# Patient Record
Sex: Female | Born: 1940 | Race: White | Hispanic: No | Marital: Single | Smoking: Never smoker
Health system: Southern US, Community
[De-identification: ages and names within clinical notes are randomized; demographics above are authoritative.]

## PROBLEM LIST (undated history)

## (undated) DIAGNOSIS — M419 Scoliosis, unspecified: Secondary | ICD-10-CM

## (undated) DIAGNOSIS — K219 Gastro-esophageal reflux disease without esophagitis: Secondary | ICD-10-CM

## (undated) DIAGNOSIS — M1990 Unspecified osteoarthritis, unspecified site: Secondary | ICD-10-CM

## (undated) DIAGNOSIS — M810 Age-related osteoporosis without current pathological fracture: Secondary | ICD-10-CM

## (undated) DIAGNOSIS — Z9889 Other specified postprocedural states: Secondary | ICD-10-CM

## (undated) DIAGNOSIS — K589 Irritable bowel syndrome without diarrhea: Secondary | ICD-10-CM

## (undated) DIAGNOSIS — K635 Polyp of colon: Secondary | ICD-10-CM

## (undated) DIAGNOSIS — E7800 Pure hypercholesterolemia, unspecified: Secondary | ICD-10-CM

## (undated) DIAGNOSIS — Z8719 Personal history of other diseases of the digestive system: Secondary | ICD-10-CM

## (undated) DIAGNOSIS — R112 Nausea with vomiting, unspecified: Secondary | ICD-10-CM

## (undated) DIAGNOSIS — K649 Unspecified hemorrhoids: Secondary | ICD-10-CM

## (undated) HISTORY — PX: EYE SURGERY: SHX253

## (undated) HISTORY — PX: OTHER SURGICAL HISTORY: SHX169

## (undated) HISTORY — PX: NASAL ENDOSCOPY: SHX286

## (undated) HISTORY — PX: DILATION AND CURETTAGE OF UTERUS: SHX78

## (undated) HISTORY — PX: CHOLECYSTECTOMY: SHX55

## (undated) HISTORY — PX: HIP SURGERY: SHX245

## (undated) HISTORY — PX: NASAL SEPTUM SURGERY: SHX37

## (undated) HISTORY — DX: Polyp of colon: K63.5

## (undated) HISTORY — DX: Pure hypercholesterolemia, unspecified: E78.00

## (undated) HISTORY — PX: WRIST SURGERY: SHX841

---

## 2000-12-25 ENCOUNTER — Ambulatory Visit (HOSPITAL_COMMUNITY): Admission: RE | Admit: 2000-12-25 | Discharge: 2000-12-25 | Payer: Self-pay | Admitting: Gastroenterology

## 2001-02-08 ENCOUNTER — Ambulatory Visit (HOSPITAL_COMMUNITY): Admission: RE | Admit: 2001-02-08 | Discharge: 2001-02-08 | Payer: Self-pay | Admitting: Gastroenterology

## 2001-05-14 ENCOUNTER — Ambulatory Visit (HOSPITAL_COMMUNITY): Admission: RE | Admit: 2001-05-14 | Discharge: 2001-05-14 | Payer: Self-pay | Admitting: Gastroenterology

## 2001-05-14 ENCOUNTER — Encounter: Payer: Self-pay | Admitting: Gastroenterology

## 2001-09-21 ENCOUNTER — Encounter: Payer: Self-pay | Admitting: Emergency Medicine

## 2001-09-21 ENCOUNTER — Emergency Department (HOSPITAL_COMMUNITY): Admission: EM | Admit: 2001-09-21 | Discharge: 2001-09-21 | Payer: Self-pay | Admitting: Emergency Medicine

## 2002-11-17 ENCOUNTER — Ambulatory Visit (HOSPITAL_COMMUNITY): Admission: RE | Admit: 2002-11-17 | Discharge: 2002-11-17 | Payer: Self-pay | Admitting: Gastroenterology

## 2002-11-17 ENCOUNTER — Encounter: Payer: Self-pay | Admitting: Gastroenterology

## 2003-11-06 ENCOUNTER — Emergency Department (HOSPITAL_COMMUNITY): Admission: AD | Admit: 2003-11-06 | Discharge: 2003-11-06 | Payer: Self-pay | Admitting: Family Medicine

## 2003-11-27 ENCOUNTER — Other Ambulatory Visit: Admission: RE | Admit: 2003-11-27 | Discharge: 2003-11-27 | Payer: Self-pay | Admitting: Family Medicine

## 2004-12-13 ENCOUNTER — Other Ambulatory Visit: Admission: RE | Admit: 2004-12-13 | Discharge: 2004-12-13 | Payer: Self-pay | Admitting: Family Medicine

## 2005-12-26 ENCOUNTER — Other Ambulatory Visit: Admission: RE | Admit: 2005-12-26 | Discharge: 2005-12-26 | Payer: Self-pay | Admitting: Family Medicine

## 2006-11-25 ENCOUNTER — Encounter: Admission: RE | Admit: 2006-11-25 | Discharge: 2006-12-17 | Payer: Self-pay | Admitting: *Deleted

## 2007-01-06 ENCOUNTER — Other Ambulatory Visit: Admission: RE | Admit: 2007-01-06 | Discharge: 2007-01-06 | Payer: Self-pay | Admitting: Family Medicine

## 2008-02-15 ENCOUNTER — Other Ambulatory Visit: Admission: RE | Admit: 2008-02-15 | Discharge: 2008-02-15 | Payer: Self-pay | Admitting: Family Medicine

## 2009-02-15 ENCOUNTER — Encounter: Admission: RE | Admit: 2009-02-15 | Discharge: 2009-04-13 | Payer: Self-pay | Admitting: Family Medicine

## 2009-03-24 ENCOUNTER — Encounter: Admission: RE | Admit: 2009-03-24 | Discharge: 2009-03-24 | Payer: Self-pay | Admitting: Family Medicine

## 2012-04-26 DIAGNOSIS — H15839 Staphyloma posticum, unspecified eye: Secondary | ICD-10-CM | POA: Diagnosis not present

## 2012-04-26 DIAGNOSIS — H259 Unspecified age-related cataract: Secondary | ICD-10-CM | POA: Diagnosis not present

## 2012-04-26 DIAGNOSIS — H52209 Unspecified astigmatism, unspecified eye: Secondary | ICD-10-CM | POA: Diagnosis not present

## 2012-04-26 DIAGNOSIS — H31009 Unspecified chorioretinal scars, unspecified eye: Secondary | ICD-10-CM | POA: Diagnosis not present

## 2012-04-29 DIAGNOSIS — H33309 Unspecified retinal break, unspecified eye: Secondary | ICD-10-CM | POA: Diagnosis not present

## 2012-04-29 DIAGNOSIS — H33109 Unspecified retinoschisis, unspecified eye: Secondary | ICD-10-CM | POA: Diagnosis not present

## 2012-05-17 DIAGNOSIS — H259 Unspecified age-related cataract: Secondary | ICD-10-CM | POA: Diagnosis not present

## 2012-05-17 DIAGNOSIS — H5000 Unspecified esotropia: Secondary | ICD-10-CM | POA: Diagnosis not present

## 2012-05-17 DIAGNOSIS — H52209 Unspecified astigmatism, unspecified eye: Secondary | ICD-10-CM | POA: Diagnosis not present

## 2012-05-25 DIAGNOSIS — H269 Unspecified cataract: Secondary | ICD-10-CM | POA: Diagnosis not present

## 2012-05-25 DIAGNOSIS — H2510 Age-related nuclear cataract, unspecified eye: Secondary | ICD-10-CM | POA: Diagnosis not present

## 2012-05-25 DIAGNOSIS — H5000 Unspecified esotropia: Secondary | ICD-10-CM | POA: Diagnosis not present

## 2012-05-25 DIAGNOSIS — H25019 Cortical age-related cataract, unspecified eye: Secondary | ICD-10-CM | POA: Diagnosis not present

## 2012-05-25 DIAGNOSIS — H52209 Unspecified astigmatism, unspecified eye: Secondary | ICD-10-CM | POA: Diagnosis not present

## 2012-05-25 DIAGNOSIS — H5210 Myopia, unspecified eye: Secondary | ICD-10-CM | POA: Diagnosis not present

## 2012-06-22 DIAGNOSIS — H25019 Cortical age-related cataract, unspecified eye: Secondary | ICD-10-CM | POA: Diagnosis not present

## 2012-06-22 DIAGNOSIS — H269 Unspecified cataract: Secondary | ICD-10-CM | POA: Diagnosis not present

## 2012-06-22 DIAGNOSIS — H2510 Age-related nuclear cataract, unspecified eye: Secondary | ICD-10-CM | POA: Diagnosis not present

## 2012-06-22 DIAGNOSIS — H52209 Unspecified astigmatism, unspecified eye: Secondary | ICD-10-CM | POA: Diagnosis not present

## 2012-06-22 DIAGNOSIS — H5000 Unspecified esotropia: Secondary | ICD-10-CM | POA: Diagnosis not present

## 2012-07-06 DIAGNOSIS — Z1231 Encounter for screening mammogram for malignant neoplasm of breast: Secondary | ICD-10-CM | POA: Diagnosis not present

## 2012-07-15 ENCOUNTER — Other Ambulatory Visit (HOSPITAL_COMMUNITY)
Admission: RE | Admit: 2012-07-15 | Discharge: 2012-07-15 | Disposition: A | Discharge status: Home / Self Care | Payer: Medicare Other | Source: Ambulatory Visit | Attending: Family Medicine | Admitting: Family Medicine

## 2012-07-15 DIAGNOSIS — Z1331 Encounter for screening for depression: Secondary | ICD-10-CM | POA: Diagnosis not present

## 2012-07-15 DIAGNOSIS — K219 Gastro-esophageal reflux disease without esophagitis: Secondary | ICD-10-CM | POA: Diagnosis not present

## 2012-07-15 DIAGNOSIS — Z23 Encounter for immunization: Secondary | ICD-10-CM | POA: Diagnosis not present

## 2012-07-15 DIAGNOSIS — Z0000 Encounter for general adult medical examination without abnormal findings: Secondary | ICD-10-CM | POA: Diagnosis not present

## 2012-07-15 DIAGNOSIS — Z124 Encounter for screening for malignant neoplasm of cervix: Secondary | ICD-10-CM | POA: Diagnosis not present

## 2012-07-15 DIAGNOSIS — N950 Postmenopausal bleeding: Secondary | ICD-10-CM | POA: Diagnosis not present

## 2012-07-15 DIAGNOSIS — E7800 Pure hypercholesterolemia, unspecified: Secondary | ICD-10-CM | POA: Diagnosis not present

## 2012-07-15 DIAGNOSIS — M810 Age-related osteoporosis without current pathological fracture: Secondary | ICD-10-CM | POA: Diagnosis not present

## 2012-08-04 DIAGNOSIS — H43819 Vitreous degeneration, unspecified eye: Secondary | ICD-10-CM | POA: Diagnosis not present

## 2012-08-09 DIAGNOSIS — N760 Acute vaginitis: Secondary | ICD-10-CM | POA: Diagnosis not present

## 2012-08-09 DIAGNOSIS — N952 Postmenopausal atrophic vaginitis: Secondary | ICD-10-CM | POA: Diagnosis not present

## 2012-08-09 DIAGNOSIS — N950 Postmenopausal bleeding: Secondary | ICD-10-CM | POA: Diagnosis not present

## 2012-08-24 DIAGNOSIS — N950 Postmenopausal bleeding: Secondary | ICD-10-CM | POA: Diagnosis not present

## 2012-08-24 DIAGNOSIS — N952 Postmenopausal atrophic vaginitis: Secondary | ICD-10-CM | POA: Diagnosis not present

## 2013-01-05 DIAGNOSIS — K219 Gastro-esophageal reflux disease without esophagitis: Secondary | ICD-10-CM | POA: Diagnosis not present

## 2013-01-05 DIAGNOSIS — E7800 Pure hypercholesterolemia, unspecified: Secondary | ICD-10-CM | POA: Diagnosis not present

## 2013-01-05 DIAGNOSIS — J309 Allergic rhinitis, unspecified: Secondary | ICD-10-CM | POA: Diagnosis not present

## 2013-04-22 DIAGNOSIS — M810 Age-related osteoporosis without current pathological fracture: Secondary | ICD-10-CM | POA: Diagnosis not present

## 2013-05-16 DIAGNOSIS — M810 Age-related osteoporosis without current pathological fracture: Secondary | ICD-10-CM | POA: Diagnosis not present

## 2013-07-18 DIAGNOSIS — Z1231 Encounter for screening mammogram for malignant neoplasm of breast: Secondary | ICD-10-CM | POA: Diagnosis not present

## 2013-08-05 DIAGNOSIS — H5000 Unspecified esotropia: Secondary | ICD-10-CM | POA: Diagnosis not present

## 2013-08-05 DIAGNOSIS — Z961 Presence of intraocular lens: Secondary | ICD-10-CM | POA: Diagnosis not present

## 2013-08-05 DIAGNOSIS — H52209 Unspecified astigmatism, unspecified eye: Secondary | ICD-10-CM | POA: Diagnosis not present

## 2013-08-09 DIAGNOSIS — E7800 Pure hypercholesterolemia, unspecified: Secondary | ICD-10-CM | POA: Diagnosis not present

## 2013-08-09 DIAGNOSIS — Z1331 Encounter for screening for depression: Secondary | ICD-10-CM | POA: Diagnosis not present

## 2013-08-09 DIAGNOSIS — Z0000 Encounter for general adult medical examination without abnormal findings: Secondary | ICD-10-CM | POA: Diagnosis not present

## 2013-08-09 DIAGNOSIS — Z23 Encounter for immunization: Secondary | ICD-10-CM | POA: Diagnosis not present

## 2013-08-09 DIAGNOSIS — M810 Age-related osteoporosis without current pathological fracture: Secondary | ICD-10-CM | POA: Diagnosis not present

## 2013-08-24 DIAGNOSIS — N952 Postmenopausal atrophic vaginitis: Secondary | ICD-10-CM | POA: Diagnosis not present

## 2013-08-24 DIAGNOSIS — N760 Acute vaginitis: Secondary | ICD-10-CM | POA: Diagnosis not present

## 2013-10-13 DIAGNOSIS — H6120 Impacted cerumen, unspecified ear: Secondary | ICD-10-CM | POA: Diagnosis not present

## 2013-10-13 DIAGNOSIS — L299 Pruritus, unspecified: Secondary | ICD-10-CM | POA: Diagnosis not present

## 2014-01-03 DIAGNOSIS — K219 Gastro-esophageal reflux disease without esophagitis: Secondary | ICD-10-CM | POA: Diagnosis not present

## 2014-01-03 DIAGNOSIS — Z79899 Other long term (current) drug therapy: Secondary | ICD-10-CM | POA: Diagnosis not present

## 2014-01-03 DIAGNOSIS — Z23 Encounter for immunization: Secondary | ICD-10-CM | POA: Diagnosis not present

## 2014-01-03 DIAGNOSIS — E7800 Pure hypercholesterolemia, unspecified: Secondary | ICD-10-CM | POA: Diagnosis not present

## 2014-07-26 DIAGNOSIS — Z1231 Encounter for screening mammogram for malignant neoplasm of breast: Secondary | ICD-10-CM | POA: Diagnosis not present

## 2014-07-26 DIAGNOSIS — Z803 Family history of malignant neoplasm of breast: Secondary | ICD-10-CM | POA: Diagnosis not present

## 2014-08-07 DIAGNOSIS — H43813 Vitreous degeneration, bilateral: Secondary | ICD-10-CM | POA: Diagnosis not present

## 2014-08-07 DIAGNOSIS — Z961 Presence of intraocular lens: Secondary | ICD-10-CM | POA: Diagnosis not present

## 2014-08-07 DIAGNOSIS — H04123 Dry eye syndrome of bilateral lacrimal glands: Secondary | ICD-10-CM | POA: Diagnosis not present

## 2014-08-07 DIAGNOSIS — H15833 Staphyloma posticum, bilateral: Secondary | ICD-10-CM | POA: Diagnosis not present

## 2014-08-11 DIAGNOSIS — K219 Gastro-esophageal reflux disease without esophagitis: Secondary | ICD-10-CM | POA: Diagnosis not present

## 2014-08-11 DIAGNOSIS — E780 Pure hypercholesterolemia: Secondary | ICD-10-CM | POA: Diagnosis not present

## 2014-08-11 DIAGNOSIS — Z1389 Encounter for screening for other disorder: Secondary | ICD-10-CM | POA: Diagnosis not present

## 2014-08-11 DIAGNOSIS — Z0000 Encounter for general adult medical examination without abnormal findings: Secondary | ICD-10-CM | POA: Diagnosis not present

## 2014-08-11 DIAGNOSIS — Z23 Encounter for immunization: Secondary | ICD-10-CM | POA: Diagnosis not present

## 2014-08-11 DIAGNOSIS — G479 Sleep disorder, unspecified: Secondary | ICD-10-CM | POA: Diagnosis not present

## 2014-08-11 DIAGNOSIS — K589 Irritable bowel syndrome without diarrhea: Secondary | ICD-10-CM | POA: Diagnosis not present

## 2014-08-11 DIAGNOSIS — M810 Age-related osteoporosis without current pathological fracture: Secondary | ICD-10-CM | POA: Diagnosis not present

## 2014-08-28 DIAGNOSIS — N952 Postmenopausal atrophic vaginitis: Secondary | ICD-10-CM | POA: Diagnosis not present

## 2014-08-28 DIAGNOSIS — Z01411 Encounter for gynecological examination (general) (routine) with abnormal findings: Secondary | ICD-10-CM | POA: Diagnosis not present

## 2014-12-21 DIAGNOSIS — L219 Seborrheic dermatitis, unspecified: Secondary | ICD-10-CM | POA: Diagnosis not present

## 2014-12-21 DIAGNOSIS — L853 Xerosis cutis: Secondary | ICD-10-CM | POA: Diagnosis not present

## 2014-12-21 DIAGNOSIS — Z808 Family history of malignant neoplasm of other organs or systems: Secondary | ICD-10-CM | POA: Diagnosis not present

## 2014-12-21 DIAGNOSIS — H01119 Allergic dermatitis of unspecified eye, unspecified eyelid: Secondary | ICD-10-CM | POA: Diagnosis not present

## 2015-02-09 DIAGNOSIS — Z79899 Other long term (current) drug therapy: Secondary | ICD-10-CM | POA: Diagnosis not present

## 2015-02-09 DIAGNOSIS — E780 Pure hypercholesterolemia: Secondary | ICD-10-CM | POA: Diagnosis not present

## 2015-02-09 DIAGNOSIS — K219 Gastro-esophageal reflux disease without esophagitis: Secondary | ICD-10-CM | POA: Diagnosis not present

## 2015-02-09 DIAGNOSIS — L853 Xerosis cutis: Secondary | ICD-10-CM | POA: Diagnosis not present

## 2015-02-09 DIAGNOSIS — F419 Anxiety disorder, unspecified: Secondary | ICD-10-CM | POA: Diagnosis not present

## 2015-02-19 DIAGNOSIS — H01119 Allergic dermatitis of unspecified eye, unspecified eyelid: Secondary | ICD-10-CM | POA: Diagnosis not present

## 2015-02-23 DIAGNOSIS — L309 Dermatitis, unspecified: Secondary | ICD-10-CM | POA: Diagnosis not present

## 2015-04-24 DIAGNOSIS — M810 Age-related osteoporosis without current pathological fracture: Secondary | ICD-10-CM | POA: Diagnosis not present

## 2015-04-24 DIAGNOSIS — Z8262 Family history of osteoporosis: Secondary | ICD-10-CM | POA: Diagnosis not present

## 2015-08-23 DIAGNOSIS — H31003 Unspecified chorioretinal scars, bilateral: Secondary | ICD-10-CM | POA: Diagnosis not present

## 2015-08-23 DIAGNOSIS — H43813 Vitreous degeneration, bilateral: Secondary | ICD-10-CM | POA: Diagnosis not present

## 2015-08-23 DIAGNOSIS — Z0100 Encounter for examination of eyes and vision without abnormal findings: Secondary | ICD-10-CM | POA: Diagnosis not present

## 2015-08-23 DIAGNOSIS — H04123 Dry eye syndrome of bilateral lacrimal glands: Secondary | ICD-10-CM | POA: Diagnosis not present

## 2015-08-27 DIAGNOSIS — Z1389 Encounter for screening for other disorder: Secondary | ICD-10-CM | POA: Diagnosis not present

## 2015-08-27 DIAGNOSIS — Z0000 Encounter for general adult medical examination without abnormal findings: Secondary | ICD-10-CM | POA: Diagnosis not present

## 2015-08-27 DIAGNOSIS — E7800 Pure hypercholesterolemia, unspecified: Secondary | ICD-10-CM | POA: Diagnosis not present

## 2015-08-27 DIAGNOSIS — K219 Gastro-esophageal reflux disease without esophagitis: Secondary | ICD-10-CM | POA: Diagnosis not present

## 2015-08-27 DIAGNOSIS — F419 Anxiety disorder, unspecified: Secondary | ICD-10-CM | POA: Diagnosis not present

## 2015-08-27 DIAGNOSIS — M810 Age-related osteoporosis without current pathological fracture: Secondary | ICD-10-CM | POA: Diagnosis not present

## 2015-08-27 DIAGNOSIS — Z23 Encounter for immunization: Secondary | ICD-10-CM | POA: Diagnosis not present

## 2015-08-28 DIAGNOSIS — Z1231 Encounter for screening mammogram for malignant neoplasm of breast: Secondary | ICD-10-CM | POA: Diagnosis not present

## 2015-08-28 DIAGNOSIS — Z803 Family history of malignant neoplasm of breast: Secondary | ICD-10-CM | POA: Diagnosis not present

## 2015-09-03 DIAGNOSIS — K5904 Chronic idiopathic constipation: Secondary | ICD-10-CM | POA: Diagnosis not present

## 2015-09-03 DIAGNOSIS — K649 Unspecified hemorrhoids: Secondary | ICD-10-CM | POA: Diagnosis not present

## 2015-09-03 DIAGNOSIS — N952 Postmenopausal atrophic vaginitis: Secondary | ICD-10-CM | POA: Diagnosis not present

## 2015-10-30 DIAGNOSIS — M5441 Lumbago with sciatica, right side: Secondary | ICD-10-CM | POA: Diagnosis not present

## 2015-10-30 DIAGNOSIS — M4156 Other secondary scoliosis, lumbar region: Secondary | ICD-10-CM | POA: Diagnosis not present

## 2015-10-30 DIAGNOSIS — M5442 Lumbago with sciatica, left side: Secondary | ICD-10-CM | POA: Diagnosis not present

## 2015-10-30 DIAGNOSIS — M4806 Spinal stenosis, lumbar region: Secondary | ICD-10-CM | POA: Diagnosis not present

## 2015-11-21 DIAGNOSIS — G8929 Other chronic pain: Secondary | ICD-10-CM | POA: Diagnosis not present

## 2015-11-21 DIAGNOSIS — M5442 Lumbago with sciatica, left side: Secondary | ICD-10-CM | POA: Diagnosis not present

## 2015-11-21 DIAGNOSIS — M5441 Lumbago with sciatica, right side: Secondary | ICD-10-CM | POA: Diagnosis not present

## 2015-11-26 DIAGNOSIS — M5441 Lumbago with sciatica, right side: Secondary | ICD-10-CM | POA: Diagnosis not present

## 2015-11-26 DIAGNOSIS — G8929 Other chronic pain: Secondary | ICD-10-CM | POA: Diagnosis not present

## 2015-11-26 DIAGNOSIS — M5442 Lumbago with sciatica, left side: Secondary | ICD-10-CM | POA: Diagnosis not present

## 2015-11-29 DIAGNOSIS — M5441 Lumbago with sciatica, right side: Secondary | ICD-10-CM | POA: Diagnosis not present

## 2015-11-29 DIAGNOSIS — G8929 Other chronic pain: Secondary | ICD-10-CM | POA: Diagnosis not present

## 2015-11-29 DIAGNOSIS — M5442 Lumbago with sciatica, left side: Secondary | ICD-10-CM | POA: Diagnosis not present

## 2015-12-03 DIAGNOSIS — M5441 Lumbago with sciatica, right side: Secondary | ICD-10-CM | POA: Diagnosis not present

## 2015-12-03 DIAGNOSIS — M5442 Lumbago with sciatica, left side: Secondary | ICD-10-CM | POA: Diagnosis not present

## 2015-12-03 DIAGNOSIS — G8929 Other chronic pain: Secondary | ICD-10-CM | POA: Diagnosis not present

## 2015-12-06 DIAGNOSIS — G8929 Other chronic pain: Secondary | ICD-10-CM | POA: Diagnosis not present

## 2015-12-06 DIAGNOSIS — M5442 Lumbago with sciatica, left side: Secondary | ICD-10-CM | POA: Diagnosis not present

## 2015-12-06 DIAGNOSIS — M5441 Lumbago with sciatica, right side: Secondary | ICD-10-CM | POA: Diagnosis not present

## 2015-12-10 DIAGNOSIS — G8929 Other chronic pain: Secondary | ICD-10-CM | POA: Diagnosis not present

## 2015-12-10 DIAGNOSIS — M5442 Lumbago with sciatica, left side: Secondary | ICD-10-CM | POA: Diagnosis not present

## 2015-12-10 DIAGNOSIS — M5441 Lumbago with sciatica, right side: Secondary | ICD-10-CM | POA: Diagnosis not present

## 2015-12-13 DIAGNOSIS — G8929 Other chronic pain: Secondary | ICD-10-CM | POA: Diagnosis not present

## 2015-12-13 DIAGNOSIS — M5441 Lumbago with sciatica, right side: Secondary | ICD-10-CM | POA: Diagnosis not present

## 2015-12-13 DIAGNOSIS — M5442 Lumbago with sciatica, left side: Secondary | ICD-10-CM | POA: Diagnosis not present

## 2015-12-17 DIAGNOSIS — G8929 Other chronic pain: Secondary | ICD-10-CM | POA: Diagnosis not present

## 2015-12-17 DIAGNOSIS — M5441 Lumbago with sciatica, right side: Secondary | ICD-10-CM | POA: Diagnosis not present

## 2015-12-17 DIAGNOSIS — M5442 Lumbago with sciatica, left side: Secondary | ICD-10-CM | POA: Diagnosis not present

## 2015-12-20 DIAGNOSIS — M5442 Lumbago with sciatica, left side: Secondary | ICD-10-CM | POA: Diagnosis not present

## 2015-12-20 DIAGNOSIS — G8929 Other chronic pain: Secondary | ICD-10-CM | POA: Diagnosis not present

## 2015-12-20 DIAGNOSIS — M5441 Lumbago with sciatica, right side: Secondary | ICD-10-CM | POA: Diagnosis not present

## 2015-12-25 DIAGNOSIS — M5441 Lumbago with sciatica, right side: Secondary | ICD-10-CM | POA: Diagnosis not present

## 2015-12-25 DIAGNOSIS — M4156 Other secondary scoliosis, lumbar region: Secondary | ICD-10-CM | POA: Diagnosis not present

## 2015-12-25 DIAGNOSIS — M4806 Spinal stenosis, lumbar region: Secondary | ICD-10-CM | POA: Diagnosis not present

## 2015-12-25 DIAGNOSIS — M5442 Lumbago with sciatica, left side: Secondary | ICD-10-CM | POA: Diagnosis not present

## 2016-01-23 DIAGNOSIS — M5442 Lumbago with sciatica, left side: Secondary | ICD-10-CM | POA: Diagnosis not present

## 2016-02-05 DIAGNOSIS — M4806 Spinal stenosis, lumbar region: Secondary | ICD-10-CM | POA: Diagnosis not present

## 2016-02-05 DIAGNOSIS — M4156 Other secondary scoliosis, lumbar region: Secondary | ICD-10-CM | POA: Diagnosis not present

## 2016-02-05 DIAGNOSIS — M5442 Lumbago with sciatica, left side: Secondary | ICD-10-CM | POA: Diagnosis not present

## 2016-02-18 DIAGNOSIS — M4806 Spinal stenosis, lumbar region: Secondary | ICD-10-CM | POA: Diagnosis not present

## 2016-02-25 DIAGNOSIS — M4806 Spinal stenosis, lumbar region: Secondary | ICD-10-CM | POA: Diagnosis not present

## 2016-02-25 DIAGNOSIS — M5441 Lumbago with sciatica, right side: Secondary | ICD-10-CM | POA: Diagnosis not present

## 2016-02-25 DIAGNOSIS — M4156 Other secondary scoliosis, lumbar region: Secondary | ICD-10-CM | POA: Diagnosis not present

## 2016-03-20 DIAGNOSIS — M4806 Spinal stenosis, lumbar region: Secondary | ICD-10-CM | POA: Diagnosis not present

## 2016-03-20 DIAGNOSIS — M5441 Lumbago with sciatica, right side: Secondary | ICD-10-CM | POA: Diagnosis not present

## 2016-03-20 DIAGNOSIS — G8929 Other chronic pain: Secondary | ICD-10-CM | POA: Diagnosis not present

## 2016-04-09 DIAGNOSIS — M5416 Radiculopathy, lumbar region: Secondary | ICD-10-CM | POA: Diagnosis not present

## 2016-04-14 DIAGNOSIS — R109 Unspecified abdominal pain: Secondary | ICD-10-CM | POA: Diagnosis not present

## 2016-04-16 ENCOUNTER — Emergency Department (HOSPITAL_COMMUNITY): Payer: Medicare Other

## 2016-04-16 ENCOUNTER — Encounter (HOSPITAL_COMMUNITY): Payer: Self-pay

## 2016-04-16 ENCOUNTER — Emergency Department (HOSPITAL_COMMUNITY)
Admission: EM | Admit: 2016-04-16 | Discharge: 2016-04-16 | Disposition: A | Discharge status: Home / Self Care | Payer: Medicare Other | Attending: Emergency Medicine | Admitting: Emergency Medicine

## 2016-04-16 DIAGNOSIS — M549 Dorsalgia, unspecified: Secondary | ICD-10-CM | POA: Insufficient documentation

## 2016-04-16 DIAGNOSIS — R1031 Right lower quadrant pain: Secondary | ICD-10-CM | POA: Diagnosis not present

## 2016-04-16 DIAGNOSIS — K5901 Slow transit constipation: Secondary | ICD-10-CM | POA: Diagnosis not present

## 2016-04-16 DIAGNOSIS — Z79899 Other long term (current) drug therapy: Secondary | ICD-10-CM | POA: Insufficient documentation

## 2016-04-16 HISTORY — DX: Irritable bowel syndrome, unspecified: K58.9

## 2016-04-16 HISTORY — DX: Gastro-esophageal reflux disease without esophagitis: K21.9

## 2016-04-16 HISTORY — DX: Age-related osteoporosis without current pathological fracture: M81.0

## 2016-04-16 LAB — COMPREHENSIVE METABOLIC PANEL
ALBUMIN: 4.6 g/dL (ref 3.5–5.0)
ALK PHOS: 59 U/L (ref 38–126)
ALT: 12 U/L — ABNORMAL LOW (ref 14–54)
ANION GAP: 9 (ref 5–15)
AST: 20 U/L (ref 15–41)
BILIRUBIN TOTAL: 0.7 mg/dL (ref 0.3–1.2)
BUN: 20 mg/dL (ref 6–20)
CALCIUM: 9.1 mg/dL (ref 8.9–10.3)
CO2: 24 mmol/L (ref 22–32)
Chloride: 105 mmol/L (ref 101–111)
Creatinine, Ser: 0.85 mg/dL (ref 0.44–1.00)
GLUCOSE: 92 mg/dL (ref 65–99)
Potassium: 3.9 mmol/L (ref 3.5–5.1)
Sodium: 138 mmol/L (ref 135–145)
TOTAL PROTEIN: 8.2 g/dL — AB (ref 6.5–8.1)

## 2016-04-16 LAB — CBC
HCT: 42.5 % (ref 36.0–46.0)
HEMOGLOBIN: 14.5 g/dL (ref 12.0–15.0)
MCH: 29.7 pg (ref 26.0–34.0)
MCHC: 34.1 g/dL (ref 30.0–36.0)
MCV: 86.9 fL (ref 78.0–100.0)
Platelets: 276 10*3/uL (ref 150–400)
RBC: 4.89 MIL/uL (ref 3.87–5.11)
RDW: 13.6 % (ref 11.5–15.5)
WBC: 10.3 10*3/uL (ref 4.0–10.5)

## 2016-04-16 LAB — URINALYSIS, ROUTINE W REFLEX MICROSCOPIC
BILIRUBIN URINE: NEGATIVE
Glucose, UA: NEGATIVE mg/dL
Hgb urine dipstick: NEGATIVE
KETONES UR: NEGATIVE mg/dL
LEUKOCYTES UA: NEGATIVE
NITRITE: NEGATIVE
Protein, ur: NEGATIVE mg/dL
SPECIFIC GRAVITY, URINE: 1.024 (ref 1.005–1.030)
pH: 6 (ref 5.0–8.0)

## 2016-04-16 LAB — LIPASE, BLOOD: Lipase: 29 U/L (ref 11–51)

## 2016-04-16 MED ORDER — KETOROLAC TROMETHAMINE 60 MG/2ML IM SOLN
60.0000 mg | Freq: Once | INTRAMUSCULAR | Status: AC
  Administered 2016-04-16: 60 mg via INTRAMUSCULAR
  Filled 2016-04-16: qty 2

## 2016-04-16 MED ORDER — POLYETHYLENE GLYCOL 3350 17 GM/SCOOP PO POWD: ORAL | Status: DC

## 2016-04-16 MED ORDER — ACETAMINOPHEN 500 MG PO TABS
1000.0000 mg | ORAL_TABLET | Freq: Once | ORAL | Status: AC
  Administered 2016-04-16: 1000 mg via ORAL
  Filled 2016-04-16: qty 2

## 2016-04-16 NOTE — Discharge Instructions (Signed)

## 2016-04-16 NOTE — ED Notes (Signed)
Patient reports right lower abdominal pain that radiates into the right lower back x 3 days. Patient saw her PCP 2 days ago. Patient was given an rx for hycosamine. Patient states pain continues. Patient reports a history of IBS.

## 2016-04-16 NOTE — ED Provider Notes (Signed)
CSN: [id number]     Arrival date & time 04/16/16  [id number] History   First MD Initiated Contact with Patient 04/16/16 0930     Chief Complaint  Patient presents with  . Abdominal Pain  . Back Pain     (Consider location/radiation/quality/duration/timing/severity/associated sxs/prior Treatment) Patient is a 75 y.o. female presenting with abdominal pain and back pain. The history is provided by the patient.  Abdominal Pain Pain location:  RLQ Pain quality: aching and sharp   Pain radiates to:  Back and R flank Pain severity:  Moderate Onset quality:  Gradual Duration:  3 days Timing:  Constant Progression:  Worsening Chronicity:  New Context: previous surgery (remote chole)   Relieved by:  Nothing Worsened by:  Nothing tried Ineffective treatments:  None tried Associated symptoms: vomiting (on day of onset once, resolved)   Associated symptoms: no anorexia, no constipation, no diarrhea, no fever and no hematuria   Risk factors: being elderly and obesity   Back Pain Associated symptoms: abdominal pain   Associated symptoms: no fever     Past Medical History  Diagnosis Date  . IBS (irritable bowel syndrome)   . Acid reflux   . Osteoporosis    Past Surgical History  Procedure Laterality Date  . Eye surgery    . Nasal septum surgery    . Cholecystectomy    . Wrist surgery    . Hip surgery    . Thumb surgery    . Dilation and curettage of uterus     Family History  Problem Relation Age of Onset  . Rheum arthritis Mother   . Cancer Mother   . Heart failure Father    Social History  Substance Use Topics  . Smoking status: Never Smoker   . Smokeless tobacco: Never Used  . Alcohol Use: No   OB History    No data available     Review of Systems  Constitutional: Negative for fever.  Gastrointestinal: Positive for vomiting (on day of onset once, resolved) and abdominal pain. Negative for diarrhea, constipation and anorexia.  Genitourinary: Negative for hematuria.   Musculoskeletal: Positive for back pain.  All other systems reviewed and are negative.     Allergies  Ampicillin; Codeine; Crestor; Lipitor; Nitroglycerin; Sulfa antibiotics; and Adhesive  Home Medications   Prior to Admission medications   Medication Sig Start Date End Date Taking? Authorizing Provider  alendronate (FOSAMAX) 70 MG tablet Take 70 mg by mouth once a week. Take with a full glass of water on an empty stomach.   Yes Historical Provider, MD  calcium carbonate (TUMS - DOSED IN MG ELEMENTAL CALCIUM) 500 MG chewable tablet Chew 1 tablet by mouth daily.   Yes Historical Provider, MD  Calcium Citrate-Vitamin D (CALCIUM + D PO) Take 1 tablet by mouth daily.   Yes Historical Provider, MD  diphenhydramine-acetaminophen (TYLENOL PM) 25-500 MG TABS tablet Take 1 tablet by mouth at bedtime as needed (pain/sleep).   Yes Historical Provider, MD  estradiol (ESTRACE) 0.1 MG/GM vaginal cream Place 1 Applicatorful vaginally once a week.   Yes Historical Provider, MD  hyoscyamine (LEVSIN SL) 0.125 MG SL tablet Place 0.125 mg under the tongue every 4 (four) hours as needed for cramping.   Yes Historical Provider, MD  ibuprofen (ADVIL,MOTRIN) 200 MG tablet Take 400 mg by mouth every 6 (six) hours as needed for mild pain.   Yes Historical Provider, MD  Melatonin 3 MG TABS Take 1 tablet by mouth at bedtime as needed (for  sleep).   Yes Historical Provider, MD  omeprazole (PRILOSEC) 40 MG capsule Take 40 mg by mouth daily.   Yes Historical Provider, MD   BP 114/96 mmHg  Pulse 108  Temp(Src) 98.2 F (36.8 C) (Oral)  Resp 16  Ht 4' 10.5" (1.486 m)  Wt 125 lb (56.7 kg)  BMI 25.68 kg/m2  SpO2 100% Physical Exam  Constitutional: She is oriented to person, place, and time. She appears well-developed and well-nourished. No distress.  HENT:  Head: Normocephalic.  Eyes: Conjunctivae are normal.  Neck: Neck supple. No tracheal deviation present.  Cardiovascular: Normal rate and regular rhythm.    Pulmonary/Chest: Effort normal. No respiratory distress.  Abdominal: Soft. She exhibits no distension. There is tenderness (mild right sided). There is no rebound and no guarding.  Neurological: She is alert and oriented to person, place, and time.  Skin: Skin is warm and dry.  Psychiatric: She has a normal mood and affect.    ED Course  Procedures (including critical care time) Labs Review Labs Reviewed  COMPREHENSIVE METABOLIC PANEL - Abnormal; Notable for the following:    Total Protein 8.2 (*)    ALT 12 (*)    All other components within normal limits  URINALYSIS, ROUTINE W REFLEX MICROSCOPIC (NOT AT [facility]) - Abnormal; Notable for the following:    APPearance CLOUDY (*)    All other components within normal limits  LIPASE, BLOOD  CBC    Imaging Review Ct Renal Stone Study  04/16/2016  CLINICAL DATA:  Right lower quadrant abdominal pain radiating to the back, 3 days duration. EXAM: CT ABDOMEN AND PELVIS WITHOUT CONTRAST TECHNIQUE: Multidetector CT imaging of the abdomen and pelvis was performed following the standard protocol without IV contrast. COMPARISON:  None. FINDINGS: Lung bases are clear.  There is a large hiatal hernia. The liver appears normal without contrast. Previous cholecystectomy. The spleen is normal. The pancreas is normal. The adrenal glands are normal. The kidneys are normal without evidence of cyst, mass, stone or hydronephrosis. Probable dromedary hump on the left. The aorta shows atherosclerosis but no aneurysm. The IVC is normal. No retroperitoneal mass or lymphadenopathy. The appendix is normal. There is moderate amount of fecal matter in the right colon but not out of the range of normal. There is diverticulosis in the sigmoid region but no imaging finding of diverticulitis. There is pronounced curvature and degenerative change of the spine, optic RO a in the thoracolumbar junction region. Old superior endplate deformity of L2. IMPRESSION: No acute finding. No  definite cause of the presenting symptoms is identified. No evidence of urinary tract stone disease or other acute retroperitoneal pathology. Atherosclerosis of the aorta. Moderate amount of fecal matter in the right colon, not out of limits of normal. Diverticulosis without evidence of diverticulitis by imaging. Electronically Signed   By: Nelson Chimes M.D.   On: 04/16/2016 11:10   I have personally reviewed and evaluated these images and lab results as part of my medical decision-making.   EKG Interpretation None      MDM   Final diagnoses:  Slow transit constipation   75 y.o. female presents with right lower quadrant and flank pain with some radiation to back. Labs and UA reassuring but pain concerning for stone vs occult etiology given advanced age. Provided tylenol and toradol for symptoms with good relief. CT shows no stones or signs of appendicitis but moderate right sided colonic stool burden which may be producing colic symptoms. Plan for miralax bowel cleanout and  close f/u by PCP for recheck. Plan to follow up with PCP as needed and return precautions discussed for worsening or new concerning symptoms.     Leo Grosser, MD 04/16/16 1949

## 2016-04-18 ENCOUNTER — Encounter (HOSPITAL_BASED_OUTPATIENT_CLINIC_OR_DEPARTMENT_OTHER): Payer: Self-pay | Admitting: *Deleted

## 2016-04-18 ENCOUNTER — Emergency Department (HOSPITAL_BASED_OUTPATIENT_CLINIC_OR_DEPARTMENT_OTHER)
Admission: EM | Admit: 2016-04-18 | Discharge: 2016-04-18 | Disposition: A | Discharge status: Home / Self Care | Payer: Medicare Other | Attending: Emergency Medicine | Admitting: Emergency Medicine

## 2016-04-18 DIAGNOSIS — M5440 Lumbago with sciatica, unspecified side: Secondary | ICD-10-CM | POA: Diagnosis not present

## 2016-04-18 DIAGNOSIS — M5441 Lumbago with sciatica, right side: Secondary | ICD-10-CM

## 2016-04-18 DIAGNOSIS — R1031 Right lower quadrant pain: Secondary | ICD-10-CM

## 2016-04-18 LAB — CBC WITH DIFFERENTIAL/PLATELET
BASOS PCT: 1 %
Basophils Absolute: 0.1 10*3/uL (ref 0.0–0.1)
EOS ABS: 0.2 10*3/uL (ref 0.0–0.7)
EOS PCT: 3 %
HCT: 42.1 % (ref 36.0–46.0)
HEMOGLOBIN: 14.2 g/dL (ref 12.0–15.0)
Lymphocytes Relative: 21 %
Lymphs Abs: 1.7 10*3/uL (ref 0.7–4.0)
MCH: 30.1 pg (ref 26.0–34.0)
MCHC: 33.7 g/dL (ref 30.0–36.0)
MCV: 89.2 fL (ref 78.0–100.0)
MONO ABS: 1 10*3/uL (ref 0.1–1.0)
MONOS PCT: 13 %
NEUTROS ABS: 5 10*3/uL (ref 1.7–7.7)
Neutrophils Relative %: 62 %
Platelets: 248 10*3/uL (ref 150–400)
RBC: 4.72 MIL/uL (ref 3.87–5.11)
RDW: 13.7 % (ref 11.5–15.5)
WBC: 8 10*3/uL (ref 4.0–10.5)

## 2016-04-18 LAB — BASIC METABOLIC PANEL
Anion gap: 8 (ref 5–15)
BUN: 13 mg/dL (ref 6–20)
CHLORIDE: 109 mmol/L (ref 101–111)
CO2: 22 mmol/L (ref 22–32)
Calcium: 9.1 mg/dL (ref 8.9–10.3)
Creatinine, Ser: 0.7 mg/dL (ref 0.44–1.00)
GFR calc Af Amer: >60 mL/min (ref >60)
GFR calc non Af Amer: >60 mL/min (ref >60)
GLUCOSE: 97 mg/dL (ref 65–99)
POTASSIUM: 3.9 mmol/L (ref 3.5–5.1)
Sodium: 139 mmol/L (ref 135–145)

## 2016-04-18 MED ORDER — ONDANSETRON 8 MG PO TBDP
8.0000 mg | ORAL_TABLET | Freq: Once | ORAL | Status: AC
  Administered 2016-04-18: 8 mg via ORAL
  Filled 2016-04-18: qty 1

## 2016-04-18 MED ORDER — ORPHENADRINE CITRATE ER 100 MG PO TB12: 100.0000 mg | ORAL_TABLET | Freq: Two times a day (BID) | ORAL | Status: DC

## 2016-04-18 MED ORDER — OXYCODONE-ACETAMINOPHEN 5-325 MG PO TABS
1.0000 | ORAL_TABLET | Freq: Once | ORAL | Status: AC
  Administered 2016-04-18: 1 via ORAL
  Filled 2016-04-18: qty 1

## 2016-04-18 MED ORDER — NAPROXEN 500 MG PO TABS: 500.0000 mg | ORAL_TABLET | Freq: Two times a day (BID) | ORAL | Status: DC

## 2016-04-18 NOTE — Discharge Instructions (Signed)
Your lab work again today was normal. We suspect your abdominal pain may be caused by a back issue. Please take naprosyn for pain as prescribed. Norflex for spasms. Follow up with a family doctor for recheck. Return if worsening.   Back Pain, Adult Back pain is very common in adults.The cause of back pain is rarely dangerous and the pain often gets better over time.The cause of your back pain may not be known. Some common causes of back pain include:  Strain of the muscles or ligaments supporting the spine.  Wear and tear (degeneration) of the spinal disks.  Arthritis.  Direct injury to the back. For many people, back pain may return. Since back pain is rarely dangerous, most people can learn to manage this condition on their own. HOME CARE INSTRUCTIONS Watch your back pain for any changes. The following actions may help to lessen any discomfort you are feeling:  Remain active. It is stressful on your back to sit or stand in one place for long periods of time. Do not sit, drive, or stand in one place for more than 30 minutes at a time. Take short walks on even surfaces as soon as you are able.Try to increase the length of time you walk each day.  Exercise regularly as directed by your health care provider. Exercise helps your back heal faster. It also helps avoid future injury by keeping your muscles strong and flexible.  Do not stay in bed.Resting more than 1-2 days can delay your recovery.  Pay attention to your body when you bend and lift. The most comfortable positions are those that put less stress on your recovering back. Always use proper lifting techniques, including:  Bending your knees.  Keeping the load close to your body.  Avoiding twisting.  Find a comfortable position to sleep. Use a firm mattress and lie on your side with your knees slightly bent. If you lie on your back, put a pillow under your knees.  Avoid feeling anxious or stressed.Stress increases muscle  tension and can worsen back pain.It is important to recognize when you are anxious or stressed and learn ways to manage it, such as with exercise.  Take medicines only as directed by your health care provider. Over-the-counter medicines to reduce pain and inflammation are often the most helpful.Your health care provider may prescribe muscle relaxant drugs.These medicines help dull your pain so you can more quickly return to your normal activities and healthy exercise.  Apply ice to the injured area:  Put ice in a plastic bag.  Place a towel between your skin and the bag.  Leave the ice on for 20 minutes, 2-3 times a day for the first 2-3 days. After that, ice and heat may be alternated to reduce pain and spasms.  Maintain a healthy weight. Excess weight puts extra stress on your back and makes it difficult to maintain good posture. SEEK MEDICAL CARE IF:  You have pain that is not relieved with rest or medicine.  You have increasing pain going down into the legs or buttocks.  You have pain that does not improve in one week.  You have night pain.  You lose weight.  You have a fever or chills. SEEK IMMEDIATE MEDICAL CARE IF:   You develop new bowel or bladder control problems.  You have unusual weakness or numbness in your arms or legs.  You develop nausea or vomiting.  You develop abdominal pain.  You feel faint.   This information is not  intended to replace advice given to you by your health care provider. Make sure you discuss any questions you have with your health care provider.   Document Released: 10/20/2005 Document Revised: 11/10/2014 Document Reviewed: 02/21/2014 Elsevier Interactive Patient Education [employer].

## 2016-04-18 NOTE — ED Notes (Signed)
Abdominal pain. She was seen by her MD on Monday. Was seen at [facility] ED on Wednesday and CT showed increased stool. She has been using Miralax. She is not getting pain relief after having bowel movements.

## 2016-04-18 NOTE — ED Provider Notes (Signed)
CSN: [id number]     Arrival date & time 04/18/16  1636 History  By signing my name below, I, Valerie Hull, attest that this documentation has been prepared under the direction and in the presence [name], PA-C.  Electronically Signed: Tedra Coupe. Sheppard Coil, ED Scribe. 04/18/2016. 6:13 PM.    Chief Complaint  Patient presents with  . Abdominal Pain   The history is provided by the patient. No language interpreter was used.   HPI Comments: Valerie Hull is a 75 y.o. female with PMHx of IBS and Acid Reflux who presents to the Emergency Department complaining of gradual onset, constant, worsening RLQ pain radiating to right lower back and right leg x 5 days. Pt was seen by PCP on 04/14/16 for abdominal pain and presented at [facility] on 04/16/16 for worsening symptoms where she was prescribed Miralax. Per Dr. Jonelle Sports note, pt's CT showed that she has increased stool which could contribute to pain. However, she notes that pain has not relieved with using Miralax but bowel movements have been liquid-like. She has an associated loss of appetite and reports pain is exacerbated upon movement and while walking. Pt took Tylenol PM with mild relief of symptoms. She does not participate in any physical therapy exercises to relieve back pain. She reports visiting [facility] in 02/2016 where she was diagnosed with disc issues. She denies any dysuria, frequency, burning, urgency, or fever. No trouble controlling bowels or bladder. No fever. No numbness or weakness in legs.   Past Medical History  Diagnosis Date  . IBS (irritable bowel syndrome)   . Acid reflux   . Osteoporosis    Past Surgical History  Procedure Laterality Date  . Eye surgery    . Nasal septum surgery    . Cholecystectomy    . Wrist surgery    . Hip surgery    . Thumb surgery    . Dilation and curettage of uterus     Family History  Problem Relation Age of Onset  . Rheum arthritis Mother   . Cancer Mother   .  Heart failure Father    Social History  Substance Use Topics  . Smoking status: Never Smoker   . Smokeless tobacco: Never Used  . Alcohol Use: No   OB History    No data available     Review of Systems  Constitutional: Positive for appetite change. Negative for fever.  Gastrointestinal: Positive for abdominal pain and diarrhea.  Genitourinary: Negative for dysuria, urgency and frequency.  Musculoskeletal: Positive for back pain.  All other systems reviewed and are negative.   Allergies  Ampicillin; Codeine; Crestor; Lipitor; Nitroglycerin; Sulfa antibiotics; and Adhesive  Home Medications   Prior to Admission medications   Medication Sig Start Date End Date Taking? Authorizing Provider  alendronate (FOSAMAX) 70 MG tablet Take 70 mg by mouth once a week. Take with a full glass of water on an empty stomach.    Historical Provider, MD  calcium carbonate (TUMS - DOSED IN MG ELEMENTAL CALCIUM) 500 MG chewable tablet Chew 1 tablet by mouth daily.    Historical Provider, MD  Calcium Citrate-Vitamin D (CALCIUM + D PO) Take 1 tablet by mouth daily.    Historical Provider, MD  diphenhydramine-acetaminophen (TYLENOL PM) 25-500 MG TABS tablet Take 1 tablet by mouth at bedtime as needed (pain/sleep).    Historical Provider, MD  estradiol (ESTRACE) 0.1 MG/GM vaginal cream Place 1 Applicatorful vaginally once a week.    Historical Provider, MD  hyoscyamine (LEVSIN SL) 0.125 MG SL tablet Place 0.125 mg under the tongue every 4 (four) hours as needed for cramping.    Historical Provider, MD  ibuprofen (ADVIL,MOTRIN) 200 MG tablet Take 400 mg by mouth every 6 (six) hours as needed for mild pain.    Historical Provider, MD  Melatonin 3 MG TABS Take 1 tablet by mouth at bedtime as needed (for sleep).    Historical Provider, MD  omeprazole (PRILOSEC) 40 MG capsule Take 40 mg by mouth daily.    Historical Provider, MD  polyethylene glycol powder (MIRALAX) powder TAKE 6 CAPFULS OF MIRALAX IN A 32 OUNCE  GATORADE AND DRINK THE WHOLE BEVERAGE FOLLOWED BY 3 CAPFULS TWICE A DAY FOR THE NEXT WEEK AND FOLLOW UP WITH YOUR PRIMARY CARE PHYSICIAN. 04/16/16   Leo Grosser, MD   BP 141/94 mmHg  Pulse 105  Temp(Src) 97.7 F (36.5 C) (Oral)  Resp 20  Ht 4\' 11"  (1.499 m)  Wt 125 lb (56.7 kg)  BMI 25.23 kg/m2  SpO2 100% Physical Exam  Constitutional: She is oriented to person, place, and time. She appears well-developed and well-nourished. No distress.  HENT:  Head: Normocephalic and atraumatic.  Eyes: Conjunctivae and EOM are normal. Pupils are equal, round, and reactive to light.  Neck: Normal range of motion.  Cardiovascular: Normal rate, regular rhythm and normal heart sounds.   Pulmonary/Chest: Effort normal and breath sounds normal. No respiratory distress. She has no wheezes. She has no rales.  Abdominal: Soft. Bowel sounds are normal. She exhibits no distension. There is tenderness. There is no rebound and no guarding.  Right lower quadrant tenderness. No guarding or rebound tenderness  Musculoskeletal: Normal range of motion.  Right SI joint tenderness. Full range of motion right hip with no pain. Pain with right straight leg raise.  Neurological: She is alert and oriented to person, place, and time.  5/5 and equal lower extremity strength. 2+ and equal patellar reflexes bilaterally. Pt able to dorsiflex bilateral toes and feet with good strength against resistance. Equal sensation bilaterally over thighs and lower legs.   Skin: Skin is warm and dry. No rash noted.  Psychiatric: She has a normal mood and affect. Judgment normal.  Nursing note and vitals reviewed.   ED Course  Procedures (including critical care time) DIAGNOSTIC STUDIES: Oxygen Saturation is 100% on RA, normal by my interpretation.    COORDINATION OF CARE: 6:25 PM-Discussed treatment plan which includes CBC panel and BMP with pt at bedside and pt agreed to plan. Will order Percocet and Zofran  Labs Review Labs  Reviewed  CBC WITH DIFFERENTIAL/PLATELET  BASIC METABOLIC PANEL    Imaging Review No results found. I have personally reviewed and evaluated these images and lab results as part of my medical decision-making.   MDM   Final diagnoses:  Right-sided low back pain with sciatica, sciatica laterality unspecified  Right lower quadrant abdominal pain   Patient is back to emergency department with continued right lower abdominal pain and right lower back pain. On exam pain is reproducible with palpation of the right SI joint and right lower quadrant. Pain is worse with movement, laying flat and not moving makes pain better. Negative noncontrast CT 2 days ago other than stool in the right colon. Patient has also had recent workup for back pain. She had an MRI with [facility] orthopedics, unfortunately we are unable to see the results in Epic. Will recheck CBC and metabolic panel. Percocet given for pain.  Discussed with Dr.Pfeiffer,  agrees, pain most likely musculoskeletal given normal white count, negative recent CT scan. We will try NSAIDs and muscle relaxants. Close follow-up with family doctor. Return precautions discussed.  Filed Vitals:   04/18/16 1642 04/18/16 1843  BP: 141/94 150/79  Pulse: 105 90  Temp: 97.7 F (36.5 C)   TempSrc: Oral   Resp: 20 20  Height: 4\' 11"  (1.499 m)   Weight: 56.7 kg   SpO2: 100% 100%    I personally performed the services described in this documentation, which was scribed in my presence. The recorded information has been reviewed and is accurate.   Jeannett Senior, PA-C 04/18/16 2008  Charlesetta Shanks, MD 04/19/16 [phone]

## 2016-08-27 DIAGNOSIS — Z0000 Encounter for general adult medical examination without abnormal findings: Secondary | ICD-10-CM | POA: Diagnosis not present

## 2016-08-27 DIAGNOSIS — Z131 Encounter for screening for diabetes mellitus: Secondary | ICD-10-CM | POA: Diagnosis not present

## 2016-08-27 DIAGNOSIS — E7800 Pure hypercholesterolemia, unspecified: Secondary | ICD-10-CM | POA: Diagnosis not present

## 2016-08-27 DIAGNOSIS — M810 Age-related osteoporosis without current pathological fracture: Secondary | ICD-10-CM | POA: Diagnosis not present

## 2016-08-29 DIAGNOSIS — H04123 Dry eye syndrome of bilateral lacrimal glands: Secondary | ICD-10-CM | POA: Diagnosis not present

## 2016-08-29 DIAGNOSIS — H31003 Unspecified chorioretinal scars, bilateral: Secondary | ICD-10-CM | POA: Diagnosis not present

## 2016-08-29 DIAGNOSIS — H52203 Unspecified astigmatism, bilateral: Secondary | ICD-10-CM | POA: Diagnosis not present

## 2016-08-29 DIAGNOSIS — H43813 Vitreous degeneration, bilateral: Secondary | ICD-10-CM | POA: Diagnosis not present

## 2016-09-01 DIAGNOSIS — M810 Age-related osteoporosis without current pathological fracture: Secondary | ICD-10-CM | POA: Diagnosis not present

## 2016-09-01 DIAGNOSIS — Z1389 Encounter for screening for other disorder: Secondary | ICD-10-CM | POA: Diagnosis not present

## 2016-09-01 DIAGNOSIS — E7800 Pure hypercholesterolemia, unspecified: Secondary | ICD-10-CM | POA: Diagnosis not present

## 2016-09-01 DIAGNOSIS — Z0000 Encounter for general adult medical examination without abnormal findings: Secondary | ICD-10-CM | POA: Diagnosis not present

## 2016-09-01 DIAGNOSIS — Z23 Encounter for immunization: Secondary | ICD-10-CM | POA: Diagnosis not present

## 2016-09-02 DIAGNOSIS — Z1231 Encounter for screening mammogram for malignant neoplasm of breast: Secondary | ICD-10-CM | POA: Diagnosis not present

## 2016-09-03 DIAGNOSIS — Z01419 Encounter for gynecological examination (general) (routine) without abnormal findings: Secondary | ICD-10-CM | POA: Diagnosis not present

## 2016-09-03 DIAGNOSIS — N952 Postmenopausal atrophic vaginitis: Secondary | ICD-10-CM | POA: Diagnosis not present

## 2016-09-08 DIAGNOSIS — M4120 Other idiopathic scoliosis, site unspecified: Secondary | ICD-10-CM | POA: Diagnosis not present

## 2016-10-21 DIAGNOSIS — R293 Abnormal posture: Secondary | ICD-10-CM | POA: Diagnosis not present

## 2016-10-21 DIAGNOSIS — M5417 Radiculopathy, lumbosacral region: Secondary | ICD-10-CM | POA: Diagnosis not present

## 2016-10-21 DIAGNOSIS — M545 Low back pain: Secondary | ICD-10-CM | POA: Diagnosis not present

## 2016-10-21 DIAGNOSIS — M5416 Radiculopathy, lumbar region: Secondary | ICD-10-CM | POA: Diagnosis not present

## 2016-10-23 DIAGNOSIS — R293 Abnormal posture: Secondary | ICD-10-CM | POA: Diagnosis not present

## 2016-10-23 DIAGNOSIS — M5416 Radiculopathy, lumbar region: Secondary | ICD-10-CM | POA: Diagnosis not present

## 2016-10-23 DIAGNOSIS — M545 Low back pain: Secondary | ICD-10-CM | POA: Diagnosis not present

## 2016-10-23 DIAGNOSIS — M5417 Radiculopathy, lumbosacral region: Secondary | ICD-10-CM | POA: Diagnosis not present

## 2016-10-28 DIAGNOSIS — M5416 Radiculopathy, lumbar region: Secondary | ICD-10-CM | POA: Diagnosis not present

## 2016-10-28 DIAGNOSIS — M5417 Radiculopathy, lumbosacral region: Secondary | ICD-10-CM | POA: Diagnosis not present

## 2016-10-28 DIAGNOSIS — R293 Abnormal posture: Secondary | ICD-10-CM | POA: Diagnosis not present

## 2016-10-28 DIAGNOSIS — M545 Low back pain: Secondary | ICD-10-CM | POA: Diagnosis not present

## 2016-10-31 DIAGNOSIS — R293 Abnormal posture: Secondary | ICD-10-CM | POA: Diagnosis not present

## 2016-10-31 DIAGNOSIS — M5417 Radiculopathy, lumbosacral region: Secondary | ICD-10-CM | POA: Diagnosis not present

## 2016-10-31 DIAGNOSIS — M5416 Radiculopathy, lumbar region: Secondary | ICD-10-CM | POA: Diagnosis not present

## 2016-10-31 DIAGNOSIS — M545 Low back pain: Secondary | ICD-10-CM | POA: Diagnosis not present

## 2016-11-04 DIAGNOSIS — M5416 Radiculopathy, lumbar region: Secondary | ICD-10-CM | POA: Diagnosis not present

## 2016-11-04 DIAGNOSIS — M545 Low back pain: Secondary | ICD-10-CM | POA: Diagnosis not present

## 2016-11-04 DIAGNOSIS — R293 Abnormal posture: Secondary | ICD-10-CM | POA: Diagnosis not present

## 2016-11-04 DIAGNOSIS — M5417 Radiculopathy, lumbosacral region: Secondary | ICD-10-CM | POA: Diagnosis not present

## 2016-11-06 DIAGNOSIS — M4120 Other idiopathic scoliosis, site unspecified: Secondary | ICD-10-CM | POA: Diagnosis not present

## 2016-11-06 DIAGNOSIS — M5416 Radiculopathy, lumbar region: Secondary | ICD-10-CM | POA: Diagnosis not present

## 2016-11-07 DIAGNOSIS — M5416 Radiculopathy, lumbar region: Secondary | ICD-10-CM | POA: Diagnosis not present

## 2016-11-07 DIAGNOSIS — M5417 Radiculopathy, lumbosacral region: Secondary | ICD-10-CM | POA: Diagnosis not present

## 2016-11-07 DIAGNOSIS — M545 Low back pain: Secondary | ICD-10-CM | POA: Diagnosis not present

## 2016-11-07 DIAGNOSIS — R293 Abnormal posture: Secondary | ICD-10-CM | POA: Diagnosis not present

## 2016-11-10 DIAGNOSIS — R293 Abnormal posture: Secondary | ICD-10-CM | POA: Diagnosis not present

## 2016-11-10 DIAGNOSIS — M5417 Radiculopathy, lumbosacral region: Secondary | ICD-10-CM | POA: Diagnosis not present

## 2016-11-10 DIAGNOSIS — M5416 Radiculopathy, lumbar region: Secondary | ICD-10-CM | POA: Diagnosis not present

## 2016-11-10 DIAGNOSIS — M545 Low back pain: Secondary | ICD-10-CM | POA: Diagnosis not present

## 2016-11-12 DIAGNOSIS — R293 Abnormal posture: Secondary | ICD-10-CM | POA: Diagnosis not present

## 2016-11-12 DIAGNOSIS — M545 Low back pain: Secondary | ICD-10-CM | POA: Diagnosis not present

## 2016-11-12 DIAGNOSIS — M5417 Radiculopathy, lumbosacral region: Secondary | ICD-10-CM | POA: Diagnosis not present

## 2016-11-12 DIAGNOSIS — M5416 Radiculopathy, lumbar region: Secondary | ICD-10-CM | POA: Diagnosis not present

## 2016-11-18 DIAGNOSIS — R293 Abnormal posture: Secondary | ICD-10-CM | POA: Diagnosis not present

## 2016-11-18 DIAGNOSIS — M5417 Radiculopathy, lumbosacral region: Secondary | ICD-10-CM | POA: Diagnosis not present

## 2016-11-18 DIAGNOSIS — M5416 Radiculopathy, lumbar region: Secondary | ICD-10-CM | POA: Diagnosis not present

## 2016-11-18 DIAGNOSIS — M545 Low back pain: Secondary | ICD-10-CM | POA: Diagnosis not present

## 2016-11-25 DIAGNOSIS — M5417 Radiculopathy, lumbosacral region: Secondary | ICD-10-CM | POA: Diagnosis not present

## 2016-11-25 DIAGNOSIS — M5416 Radiculopathy, lumbar region: Secondary | ICD-10-CM | POA: Diagnosis not present

## 2016-11-25 DIAGNOSIS — M545 Low back pain: Secondary | ICD-10-CM | POA: Diagnosis not present

## 2016-11-25 DIAGNOSIS — R293 Abnormal posture: Secondary | ICD-10-CM | POA: Diagnosis not present

## 2016-11-28 DIAGNOSIS — R293 Abnormal posture: Secondary | ICD-10-CM | POA: Diagnosis not present

## 2016-11-28 DIAGNOSIS — M5416 Radiculopathy, lumbar region: Secondary | ICD-10-CM | POA: Diagnosis not present

## 2016-11-28 DIAGNOSIS — M545 Low back pain: Secondary | ICD-10-CM | POA: Diagnosis not present

## 2016-11-28 DIAGNOSIS — M5417 Radiculopathy, lumbosacral region: Secondary | ICD-10-CM | POA: Diagnosis not present

## 2016-12-02 DIAGNOSIS — M545 Low back pain: Secondary | ICD-10-CM | POA: Diagnosis not present

## 2016-12-02 DIAGNOSIS — M5416 Radiculopathy, lumbar region: Secondary | ICD-10-CM | POA: Diagnosis not present

## 2016-12-02 DIAGNOSIS — R293 Abnormal posture: Secondary | ICD-10-CM | POA: Diagnosis not present

## 2016-12-02 DIAGNOSIS — M5417 Radiculopathy, lumbosacral region: Secondary | ICD-10-CM | POA: Diagnosis not present

## 2016-12-04 DIAGNOSIS — M4120 Other idiopathic scoliosis, site unspecified: Secondary | ICD-10-CM | POA: Diagnosis not present

## 2016-12-04 DIAGNOSIS — M5416 Radiculopathy, lumbar region: Secondary | ICD-10-CM | POA: Diagnosis not present

## 2016-12-08 DIAGNOSIS — M545 Low back pain: Secondary | ICD-10-CM | POA: Diagnosis not present

## 2016-12-08 DIAGNOSIS — M5417 Radiculopathy, lumbosacral region: Secondary | ICD-10-CM | POA: Diagnosis not present

## 2016-12-08 DIAGNOSIS — R293 Abnormal posture: Secondary | ICD-10-CM | POA: Diagnosis not present

## 2016-12-08 DIAGNOSIS — M5416 Radiculopathy, lumbar region: Secondary | ICD-10-CM | POA: Diagnosis not present

## 2016-12-10 DIAGNOSIS — M5417 Radiculopathy, lumbosacral region: Secondary | ICD-10-CM | POA: Diagnosis not present

## 2016-12-10 DIAGNOSIS — M545 Low back pain: Secondary | ICD-10-CM | POA: Diagnosis not present

## 2016-12-10 DIAGNOSIS — M5416 Radiculopathy, lumbar region: Secondary | ICD-10-CM | POA: Diagnosis not present

## 2016-12-10 DIAGNOSIS — R293 Abnormal posture: Secondary | ICD-10-CM | POA: Diagnosis not present

## 2016-12-22 DIAGNOSIS — R030 Elevated blood-pressure reading, without diagnosis of hypertension: Secondary | ICD-10-CM | POA: Diagnosis not present

## 2016-12-22 DIAGNOSIS — M4120 Other idiopathic scoliosis, site unspecified: Secondary | ICD-10-CM | POA: Diagnosis not present

## 2016-12-22 DIAGNOSIS — M5416 Radiculopathy, lumbar region: Secondary | ICD-10-CM | POA: Diagnosis not present

## 2016-12-22 DIAGNOSIS — Z6826 Body mass index (BMI) 26.0-26.9, adult: Secondary | ICD-10-CM | POA: Diagnosis not present

## 2017-01-01 DIAGNOSIS — Z6827 Body mass index (BMI) 27.0-27.9, adult: Secondary | ICD-10-CM | POA: Diagnosis not present

## 2017-01-01 DIAGNOSIS — M5416 Radiculopathy, lumbar region: Secondary | ICD-10-CM | POA: Diagnosis not present

## 2017-01-01 DIAGNOSIS — R030 Elevated blood-pressure reading, without diagnosis of hypertension: Secondary | ICD-10-CM | POA: Diagnosis not present

## 2017-01-01 DIAGNOSIS — M4120 Other idiopathic scoliosis, site unspecified: Secondary | ICD-10-CM | POA: Diagnosis not present

## 2017-01-18 IMAGING — CT CT RENAL STONE PROTOCOL
2 of 3 series · 17 of 46 positions shown, 19 images · non-contrast
Comparison: None.

CLINICAL DATA: Right lower quadrant abdominal pain radiating to the
back, 3 days duration.

EXAM:
CT ABDOMEN AND PELVIS WITHOUT CONTRAST
TECHNIQUE: Multidetector CT imaging of the abdomen and pelvis was performed
following the standard protocol without IV contrast.

[Series 4: lung · axial · 0.64mm/px · z∈[-122,-36]mm · 14 of 51 slices shown, 16 images]
[im 4/51  soft-tissue]
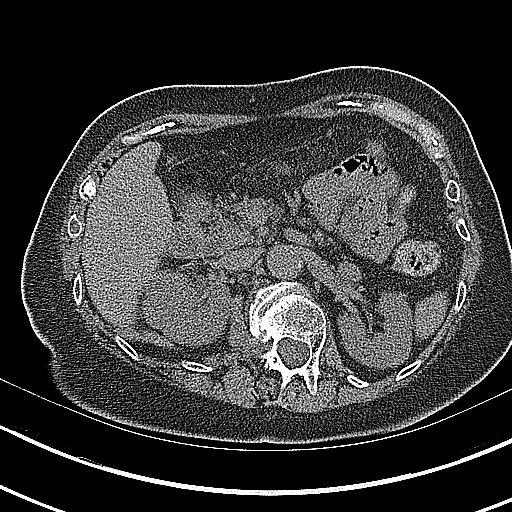
[im 4/51  bone]
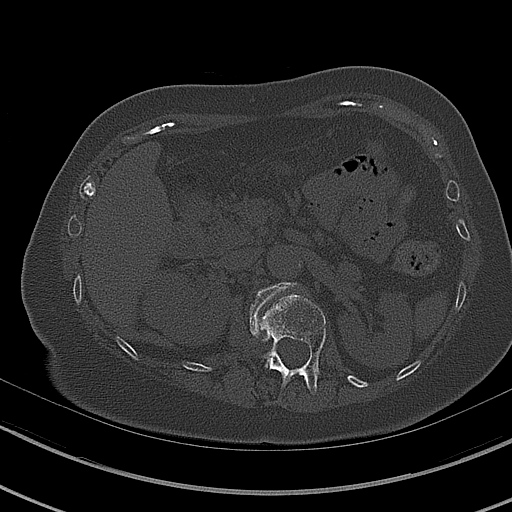
[im 7/51  soft-tissue]
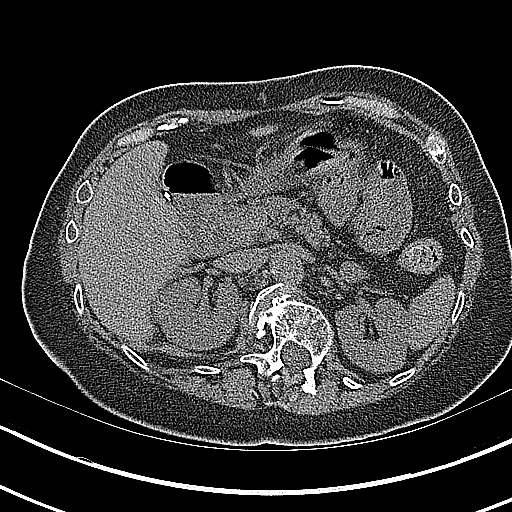
[im 10/51  soft-tissue]
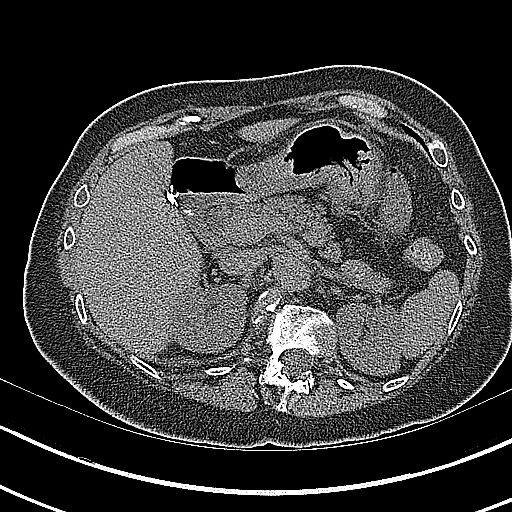
[im 13/51  soft-tissue]
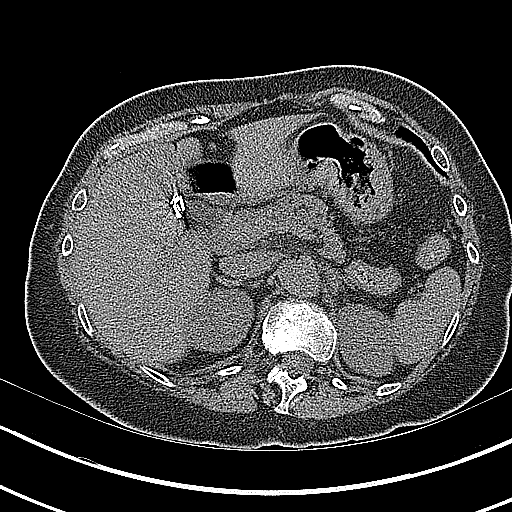
[im 17/51  soft-tissue]
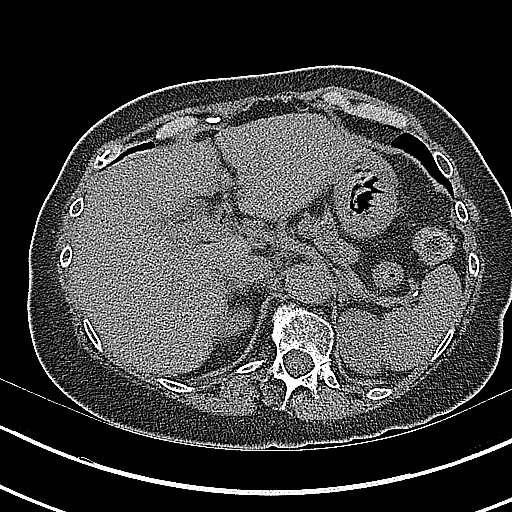
[im 20/51  soft-tissue]
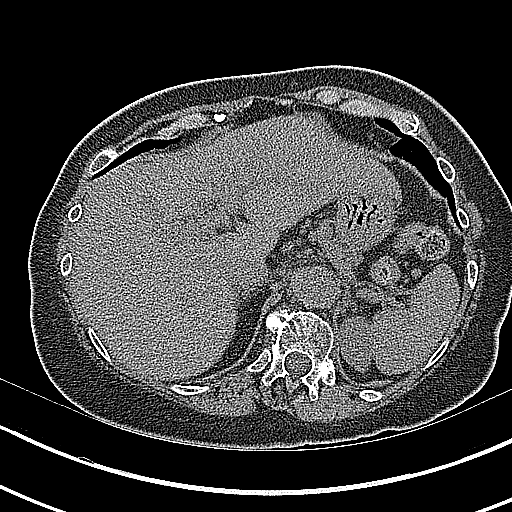
[im 23/51  soft-tissue]
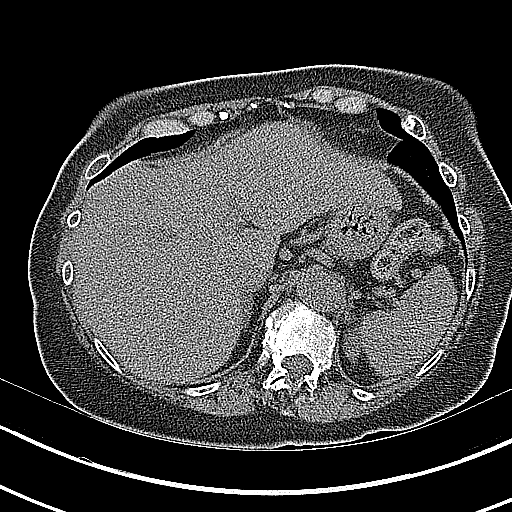
[im 28/51  soft-tissue]
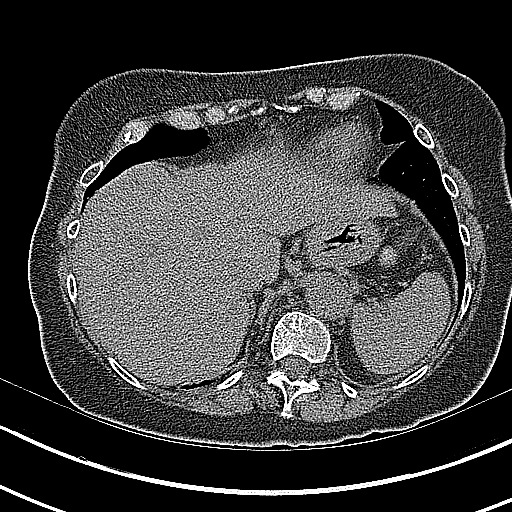
[im 31/51  soft-tissue]
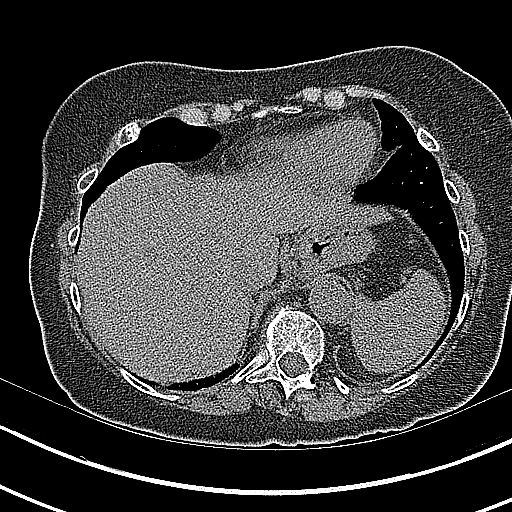
[im 31/51  bone]
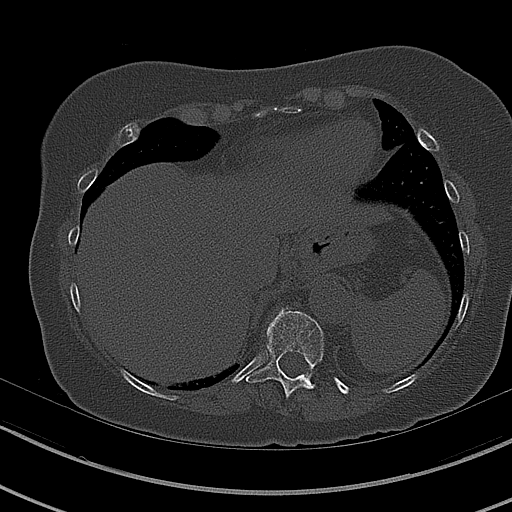
[im 34/51  soft-tissue]
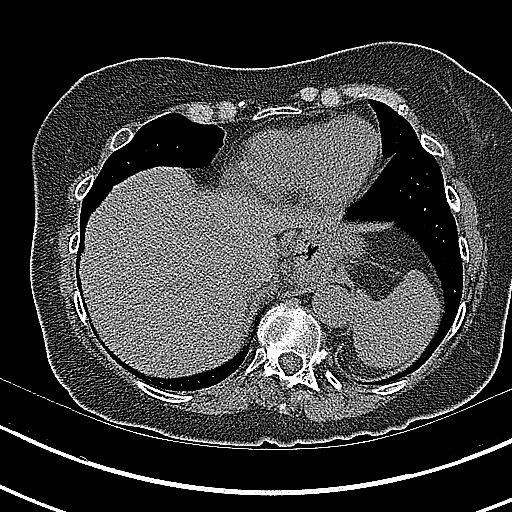
[im 38/51  soft-tissue]
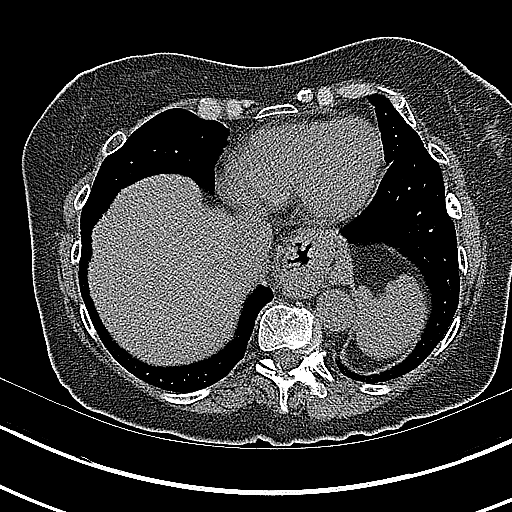
[im 41/51  soft-tissue]
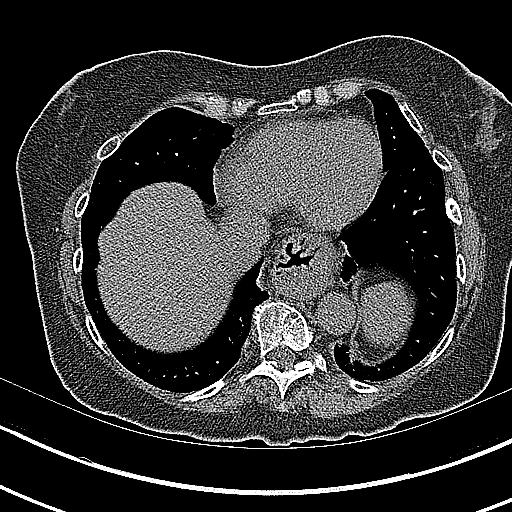
[im 44/51  soft-tissue]
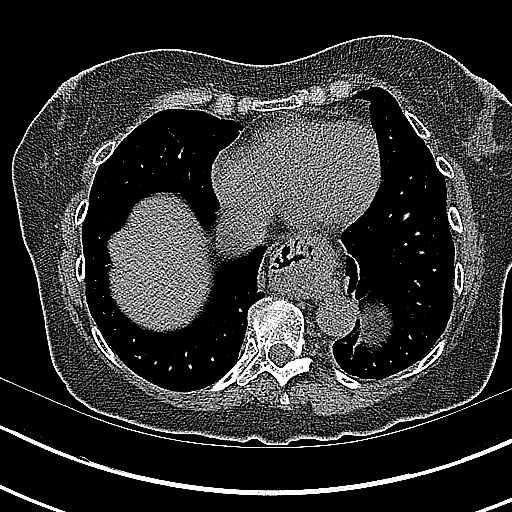
[im 47/51  soft-tissue]
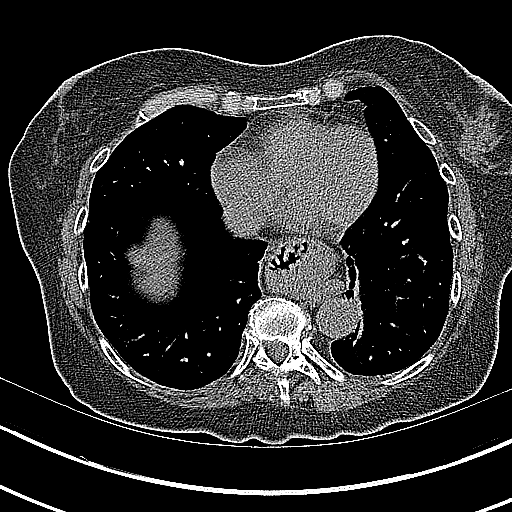

[Series 5: coronal · coronal · 0.63mm/px · 3 of 123 slices shown]
[im 41/123  soft-tissue]
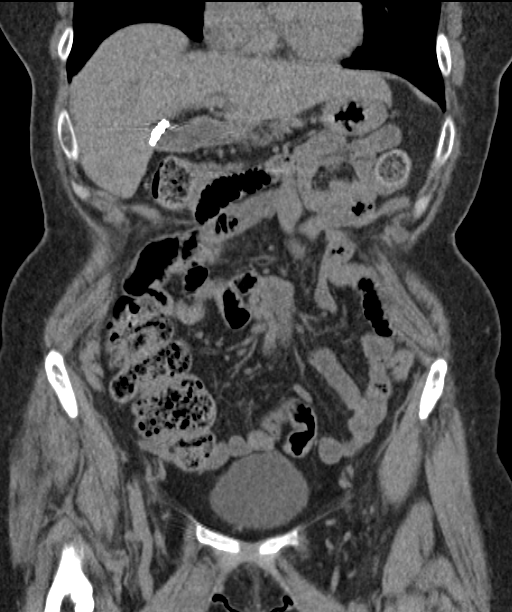
[im 55/123  soft-tissue]
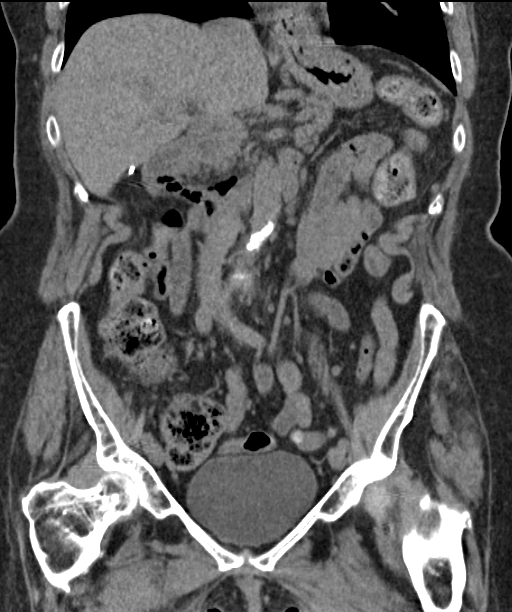
[im 68/123  soft-tissue]
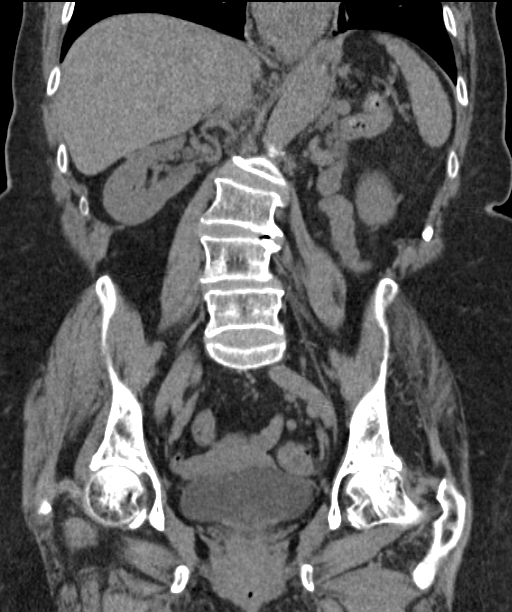

[17 of 46 positions shown; findings below may reference images not displayed]

FINDINGS: Lung bases are clear.  There is a large hiatal hernia.

The liver appears normal without contrast. Previous cholecystectomy.
The spleen is normal. The pancreas is normal. The adrenal glands are
normal. The kidneys are normal without evidence of cyst, mass, stone
or hydronephrosis. Probable dromedary hump on the left. The aorta
shows atherosclerosis but no aneurysm. The IVC is normal. No
retroperitoneal mass or lymphadenopathy. The appendix is normal.
There is moderate amount of fecal matter in the right colon but not
out of the range of normal. There is diverticulosis in the sigmoid
region but no imaging finding of diverticulitis. There is pronounced
curvature and degenerative change of the spine, optic RO a in the
thoracolumbar junction region. Old superior endplate deformity of
L2.
IMPRESSION: No acute finding. No definite cause of the presenting symptoms is
identified.

No evidence of urinary tract stone disease or other acute
retroperitoneal pathology.

Atherosclerosis of the aorta.

Moderate amount of fecal matter in the right colon, not out of
limits of normal.

Diverticulosis without evidence of diverticulitis by imaging.

## 2017-03-05 DIAGNOSIS — M4120 Other idiopathic scoliosis, site unspecified: Secondary | ICD-10-CM | POA: Diagnosis not present

## 2017-03-05 DIAGNOSIS — M5416 Radiculopathy, lumbar region: Secondary | ICD-10-CM | POA: Diagnosis not present

## 2017-04-07 DIAGNOSIS — M5416 Radiculopathy, lumbar region: Secondary | ICD-10-CM | POA: Diagnosis not present

## 2017-04-07 DIAGNOSIS — Z6826 Body mass index (BMI) 26.0-26.9, adult: Secondary | ICD-10-CM | POA: Diagnosis not present

## 2017-04-07 DIAGNOSIS — R030 Elevated blood-pressure reading, without diagnosis of hypertension: Secondary | ICD-10-CM | POA: Diagnosis not present

## 2017-04-07 DIAGNOSIS — M4120 Other idiopathic scoliosis, site unspecified: Secondary | ICD-10-CM | POA: Diagnosis not present

## 2017-04-24 DIAGNOSIS — M810 Age-related osteoporosis without current pathological fracture: Secondary | ICD-10-CM | POA: Diagnosis not present

## 2017-05-14 DIAGNOSIS — E7800 Pure hypercholesterolemia, unspecified: Secondary | ICD-10-CM | POA: Diagnosis not present

## 2017-05-14 DIAGNOSIS — M549 Dorsalgia, unspecified: Secondary | ICD-10-CM | POA: Diagnosis not present

## 2017-05-14 DIAGNOSIS — M810 Age-related osteoporosis without current pathological fracture: Secondary | ICD-10-CM | POA: Diagnosis not present

## 2017-06-02 DIAGNOSIS — H6123 Impacted cerumen, bilateral: Secondary | ICD-10-CM | POA: Diagnosis not present

## 2017-06-02 DIAGNOSIS — H608X3 Other otitis externa, bilateral: Secondary | ICD-10-CM | POA: Diagnosis not present

## 2017-06-09 DIAGNOSIS — M5416 Radiculopathy, lumbar region: Secondary | ICD-10-CM | POA: Diagnosis not present

## 2017-06-12 DIAGNOSIS — J0100 Acute maxillary sinusitis, unspecified: Secondary | ICD-10-CM | POA: Diagnosis not present

## 2017-07-01 DIAGNOSIS — R030 Elevated blood-pressure reading, without diagnosis of hypertension: Secondary | ICD-10-CM | POA: Diagnosis not present

## 2017-07-01 DIAGNOSIS — M5416 Radiculopathy, lumbar region: Secondary | ICD-10-CM | POA: Diagnosis not present

## 2017-07-01 DIAGNOSIS — Z6825 Body mass index (BMI) 25.0-25.9, adult: Secondary | ICD-10-CM | POA: Diagnosis not present

## 2017-08-20 DIAGNOSIS — M25562 Pain in left knee: Secondary | ICD-10-CM | POA: Diagnosis not present

## 2017-08-20 DIAGNOSIS — M160 Bilateral primary osteoarthritis of hip: Secondary | ICD-10-CM | POA: Diagnosis not present

## 2017-08-20 DIAGNOSIS — M2241 Chondromalacia patellae, right knee: Secondary | ICD-10-CM | POA: Diagnosis not present

## 2017-08-20 DIAGNOSIS — M25561 Pain in right knee: Secondary | ICD-10-CM | POA: Diagnosis not present

## 2017-08-24 DIAGNOSIS — J019 Acute sinusitis, unspecified: Secondary | ICD-10-CM | POA: Diagnosis not present

## 2017-09-07 DIAGNOSIS — N952 Postmenopausal atrophic vaginitis: Secondary | ICD-10-CM | POA: Diagnosis not present

## 2017-09-10 DIAGNOSIS — M5416 Radiculopathy, lumbar region: Secondary | ICD-10-CM | POA: Diagnosis not present

## 2017-09-10 DIAGNOSIS — M4120 Other idiopathic scoliosis, site unspecified: Secondary | ICD-10-CM | POA: Diagnosis not present

## 2017-09-14 DIAGNOSIS — Z0000 Encounter for general adult medical examination without abnormal findings: Secondary | ICD-10-CM | POA: Diagnosis not present

## 2017-09-14 DIAGNOSIS — R0789 Other chest pain: Secondary | ICD-10-CM | POA: Diagnosis not present

## 2017-09-14 DIAGNOSIS — K219 Gastro-esophageal reflux disease without esophagitis: Secondary | ICD-10-CM | POA: Diagnosis not present

## 2017-09-14 DIAGNOSIS — Z23 Encounter for immunization: Secondary | ICD-10-CM | POA: Diagnosis not present

## 2017-09-14 DIAGNOSIS — Z1389 Encounter for screening for other disorder: Secondary | ICD-10-CM | POA: Diagnosis not present

## 2017-09-14 DIAGNOSIS — E7800 Pure hypercholesterolemia, unspecified: Secondary | ICD-10-CM | POA: Diagnosis not present

## 2017-09-16 DIAGNOSIS — R0781 Pleurodynia: Secondary | ICD-10-CM | POA: Diagnosis not present

## 2017-09-22 DIAGNOSIS — Z1231 Encounter for screening mammogram for malignant neoplasm of breast: Secondary | ICD-10-CM | POA: Diagnosis not present

## 2017-09-28 DIAGNOSIS — H5213 Myopia, bilateral: Secondary | ICD-10-CM | POA: Diagnosis not present

## 2017-09-28 DIAGNOSIS — H04123 Dry eye syndrome of bilateral lacrimal glands: Secondary | ICD-10-CM | POA: Diagnosis not present

## 2017-09-28 DIAGNOSIS — H31003 Unspecified chorioretinal scars, bilateral: Secondary | ICD-10-CM | POA: Diagnosis not present

## 2017-09-28 DIAGNOSIS — H43813 Vitreous degeneration, bilateral: Secondary | ICD-10-CM | POA: Diagnosis not present

## 2017-10-06 DIAGNOSIS — M5416 Radiculopathy, lumbar region: Secondary | ICD-10-CM | POA: Diagnosis not present

## 2017-10-06 DIAGNOSIS — R030 Elevated blood-pressure reading, without diagnosis of hypertension: Secondary | ICD-10-CM | POA: Diagnosis not present

## 2017-10-06 DIAGNOSIS — M4120 Other idiopathic scoliosis, site unspecified: Secondary | ICD-10-CM | POA: Diagnosis not present

## 2017-11-06 DIAGNOSIS — K219 Gastro-esophageal reflux disease without esophagitis: Secondary | ICD-10-CM | POA: Diagnosis not present

## 2017-11-06 DIAGNOSIS — K5790 Diverticulosis of intestine, part unspecified, without perforation or abscess without bleeding: Secondary | ICD-10-CM | POA: Insufficient documentation

## 2017-12-14 DIAGNOSIS — M4120 Other idiopathic scoliosis, site unspecified: Secondary | ICD-10-CM | POA: Diagnosis not present

## 2017-12-14 DIAGNOSIS — M5416 Radiculopathy, lumbar region: Secondary | ICD-10-CM | POA: Diagnosis not present

## 2018-01-19 DIAGNOSIS — M5416 Radiculopathy, lumbar region: Secondary | ICD-10-CM | POA: Diagnosis not present

## 2018-01-19 DIAGNOSIS — M4120 Other idiopathic scoliosis, site unspecified: Secondary | ICD-10-CM | POA: Diagnosis not present

## 2018-03-15 DIAGNOSIS — M5416 Radiculopathy, lumbar region: Secondary | ICD-10-CM | POA: Diagnosis not present

## 2018-03-15 DIAGNOSIS — M4120 Other idiopathic scoliosis, site unspecified: Secondary | ICD-10-CM | POA: Diagnosis not present

## 2018-04-15 DIAGNOSIS — M4120 Other idiopathic scoliosis, site unspecified: Secondary | ICD-10-CM | POA: Diagnosis not present

## 2018-04-15 DIAGNOSIS — M5416 Radiculopathy, lumbar region: Secondary | ICD-10-CM | POA: Diagnosis not present

## 2018-08-19 DIAGNOSIS — M5416 Radiculopathy, lumbar region: Secondary | ICD-10-CM | POA: Diagnosis not present

## 2018-08-19 DIAGNOSIS — M4120 Other idiopathic scoliosis, site unspecified: Secondary | ICD-10-CM | POA: Diagnosis not present

## 2018-08-25 DIAGNOSIS — Z23 Encounter for immunization: Secondary | ICD-10-CM | POA: Diagnosis not present

## 2018-09-21 DIAGNOSIS — E7800 Pure hypercholesterolemia, unspecified: Secondary | ICD-10-CM | POA: Diagnosis not present

## 2018-09-21 DIAGNOSIS — M810 Age-related osteoporosis without current pathological fracture: Secondary | ICD-10-CM | POA: Diagnosis not present

## 2018-09-21 DIAGNOSIS — F419 Anxiety disorder, unspecified: Secondary | ICD-10-CM | POA: Diagnosis not present

## 2018-09-21 DIAGNOSIS — K625 Hemorrhage of anus and rectum: Secondary | ICD-10-CM | POA: Diagnosis not present

## 2018-09-21 DIAGNOSIS — K219 Gastro-esophageal reflux disease without esophagitis: Secondary | ICD-10-CM | POA: Diagnosis not present

## 2018-09-21 DIAGNOSIS — Z0000 Encounter for general adult medical examination without abnormal findings: Secondary | ICD-10-CM | POA: Diagnosis not present

## 2018-09-24 DIAGNOSIS — Z01419 Encounter for gynecological examination (general) (routine) without abnormal findings: Secondary | ICD-10-CM | POA: Diagnosis not present

## 2018-09-24 DIAGNOSIS — N952 Postmenopausal atrophic vaginitis: Secondary | ICD-10-CM | POA: Diagnosis not present

## 2018-09-27 DIAGNOSIS — R030 Elevated blood-pressure reading, without diagnosis of hypertension: Secondary | ICD-10-CM | POA: Diagnosis not present

## 2018-09-27 DIAGNOSIS — M5416 Radiculopathy, lumbar region: Secondary | ICD-10-CM | POA: Diagnosis not present

## 2018-09-27 DIAGNOSIS — M4120 Other idiopathic scoliosis, site unspecified: Secondary | ICD-10-CM | POA: Diagnosis not present

## 2018-09-28 DIAGNOSIS — Z1231 Encounter for screening mammogram for malignant neoplasm of breast: Secondary | ICD-10-CM | POA: Diagnosis not present

## 2018-10-04 DIAGNOSIS — Z961 Presence of intraocular lens: Secondary | ICD-10-CM | POA: Diagnosis not present

## 2018-10-04 DIAGNOSIS — H43813 Vitreous degeneration, bilateral: Secondary | ICD-10-CM | POA: Diagnosis not present

## 2018-10-04 DIAGNOSIS — H31003 Unspecified chorioretinal scars, bilateral: Secondary | ICD-10-CM | POA: Diagnosis not present

## 2018-10-04 DIAGNOSIS — H5213 Myopia, bilateral: Secondary | ICD-10-CM | POA: Diagnosis not present

## 2018-11-03 HISTORY — PX: COLONOSCOPY: SHX174

## 2018-11-16 DIAGNOSIS — M5416 Radiculopathy, lumbar region: Secondary | ICD-10-CM | POA: Diagnosis not present

## 2018-11-24 DIAGNOSIS — K625 Hemorrhage of anus and rectum: Secondary | ICD-10-CM | POA: Diagnosis not present

## 2018-11-24 DIAGNOSIS — K649 Unspecified hemorrhoids: Secondary | ICD-10-CM | POA: Diagnosis not present

## 2018-12-06 DIAGNOSIS — K648 Other hemorrhoids: Secondary | ICD-10-CM | POA: Diagnosis not present

## 2018-12-06 DIAGNOSIS — K625 Hemorrhage of anus and rectum: Secondary | ICD-10-CM | POA: Diagnosis not present

## 2018-12-06 DIAGNOSIS — K635 Polyp of colon: Secondary | ICD-10-CM | POA: Diagnosis not present

## 2018-12-06 DIAGNOSIS — Z800 Family history of malignant neoplasm of digestive organs: Secondary | ICD-10-CM | POA: Diagnosis not present

## 2018-12-06 DIAGNOSIS — Z9049 Acquired absence of other specified parts of digestive tract: Secondary | ICD-10-CM | POA: Diagnosis not present

## 2018-12-06 DIAGNOSIS — Z86010 Personal history of colonic polyps: Secondary | ICD-10-CM | POA: Diagnosis not present

## 2018-12-06 DIAGNOSIS — D122 Benign neoplasm of ascending colon: Secondary | ICD-10-CM | POA: Diagnosis not present

## 2018-12-06 DIAGNOSIS — K5730 Diverticulosis of large intestine without perforation or abscess without bleeding: Secondary | ICD-10-CM | POA: Diagnosis not present

## 2018-12-15 DIAGNOSIS — K644 Residual hemorrhoidal skin tags: Secondary | ICD-10-CM | POA: Diagnosis not present

## 2018-12-15 DIAGNOSIS — K648 Other hemorrhoids: Secondary | ICD-10-CM | POA: Diagnosis not present

## 2018-12-21 DIAGNOSIS — M5416 Radiculopathy, lumbar region: Secondary | ICD-10-CM | POA: Diagnosis not present

## 2018-12-21 DIAGNOSIS — M4120 Other idiopathic scoliosis, site unspecified: Secondary | ICD-10-CM | POA: Diagnosis not present

## 2019-01-12 DIAGNOSIS — K648 Other hemorrhoids: Secondary | ICD-10-CM | POA: Diagnosis not present

## 2019-03-10 DIAGNOSIS — M5416 Radiculopathy, lumbar region: Secondary | ICD-10-CM | POA: Diagnosis not present

## 2019-03-16 DIAGNOSIS — K648 Other hemorrhoids: Secondary | ICD-10-CM | POA: Diagnosis not present

## 2019-04-12 DIAGNOSIS — M4120 Other idiopathic scoliosis, site unspecified: Secondary | ICD-10-CM | POA: Diagnosis not present

## 2019-04-12 DIAGNOSIS — M5416 Radiculopathy, lumbar region: Secondary | ICD-10-CM | POA: Diagnosis not present

## 2019-04-13 DIAGNOSIS — K625 Hemorrhage of anus and rectum: Secondary | ICD-10-CM | POA: Diagnosis not present

## 2019-04-13 DIAGNOSIS — K648 Other hemorrhoids: Secondary | ICD-10-CM | POA: Diagnosis not present

## 2019-06-08 DIAGNOSIS — K648 Other hemorrhoids: Secondary | ICD-10-CM | POA: Diagnosis not present

## 2019-07-04 DIAGNOSIS — M5416 Radiculopathy, lumbar region: Secondary | ICD-10-CM | POA: Diagnosis not present

## 2019-08-23 DIAGNOSIS — M4120 Other idiopathic scoliosis, site unspecified: Secondary | ICD-10-CM | POA: Diagnosis not present

## 2019-08-23 DIAGNOSIS — M5416 Radiculopathy, lumbar region: Secondary | ICD-10-CM | POA: Diagnosis not present

## 2019-08-31 DIAGNOSIS — Z23 Encounter for immunization: Secondary | ICD-10-CM | POA: Diagnosis not present

## 2019-09-09 DIAGNOSIS — K625 Hemorrhage of anus and rectum: Secondary | ICD-10-CM | POA: Diagnosis not present

## 2019-09-09 DIAGNOSIS — K648 Other hemorrhoids: Secondary | ICD-10-CM | POA: Diagnosis not present

## 2019-09-22 DIAGNOSIS — M4120 Other idiopathic scoliosis, site unspecified: Secondary | ICD-10-CM | POA: Diagnosis not present

## 2019-09-22 DIAGNOSIS — M5416 Radiculopathy, lumbar region: Secondary | ICD-10-CM | POA: Diagnosis not present

## 2019-09-27 ENCOUNTER — Ambulatory Visit: Payer: Self-pay | Admitting: Surgery

## 2019-09-27 DIAGNOSIS — Z86010 Personal history of colonic polyps: Secondary | ICD-10-CM | POA: Diagnosis not present

## 2019-09-27 DIAGNOSIS — K581 Irritable bowel syndrome with constipation: Secondary | ICD-10-CM | POA: Diagnosis not present

## 2019-09-27 DIAGNOSIS — K641 Second degree hemorrhoids: Secondary | ICD-10-CM | POA: Diagnosis not present

## 2019-09-27 DIAGNOSIS — K648 Other hemorrhoids: Secondary | ICD-10-CM | POA: Diagnosis not present

## 2019-09-27 DIAGNOSIS — K644 Residual hemorrhoidal skin tags: Secondary | ICD-10-CM | POA: Diagnosis not present

## 2019-09-27 NOTE — H&P (Signed)
Valerie Hull Documented: 09/27/2019 9:11 AM Location: [facility] Patient #: [id number] DOB: [birth year] Single / Language: Valerie Hull / Race: White Female  History of Present Illness Adin Hector MD; 09/27/2019 12:30 PM) The patient is a 78 year old female who presents with hemorrhoids. Note for "Hemorrhoids": ` ` ` Patient sent for surgical consultation at the request of Dr Earlean Shawl  Chief Complaint: Persistent bleeding hemorrhoid status post prior banding. ` ` The patient is a woman that is struggle with rectal bleeding for some time. She underwent colonoscopy by Dr. Earlean Shawl. One tubular adenoma removed. Enlarged hemorrhoid seen. This was in February 2020. She is had a few rounds of the sigmoidoscopy [facility] banding by Dr. Earlean Shawl with persistent bleeding. Surgical consultation requested to have more definitive intervention. She does not smoke. No cardiac or stroke issues. Can walk 1/2-hour without difficulty. Rather active and independent. She moves her bowels about every 2 days with the help of one Benefiber. Occasional hard stools but no major straining. They think she has undergone banding over 6 times. She recalls having hemorrhoid surgery in the 1970s. No other anorectal surgery.  No personal nor family history of GI/colon cancer, inflammatory bowel disease, irritable bowel syndrome, allergy such as Celiac Sprue, dietary/dairy problems, colitis, ulcers nor gastritis. No recent sick contacts/gastroenteritis. No travel outside the country. No changes in diet. No dysphagia to solids or liquids. No significant heartburn or reflux. No hematochezia, hematemesis, coffee ground emesis. No evidence of prior gastric/peptic ulceration.  (Review of systems as stated in this history (HPI) or in the review of systems. Otherwise all other 12 point ROS are negative) ` ` `   Past Surgical History (Valerie Hull, [place]; 09/27/2019 9:11 AM) Cataract Surgery Bilateral.  Gallbladder Surgery - Laparoscopic Hemorrhoidectomy  Diagnostic Studies History (Valerie Hull, [place]; 09/27/2019 9:11 AM) Colonoscopy within last year Mammogram within last year Pap Smear 1-5 years ago  Allergies (Valerie Hull, RMA; 09/27/2019 9:13 AM) Codeine Phosphate *ANALGESICS - OPIOID* Sulfa Antibiotics Ampicillin *PENICILLINS* Septra *ANTI-INFECTIVE AGENTS - MISC.* Nitroglycerin *ANTIANGINAL AGENTS* Lipitor *ANTIHYPERLIPIDEMICS* Crestor *ANTIHYPERLIPIDEMICS* Adhesive Tape Allergies Reconciled  Medication History (Valerie Hull, [place]; 09/27/2019 9:14 AM) Omeprazole (40MG  Capsule DR, Oral) Active. Gabapentin (300MG  Capsule, Oral) Active. Alendronate Sodium (70MG  Tablet, Oral) Active. Medications Reconciled  Social History (Valerie Hull, RMA; 09/27/2019 9:11 AM) Caffeine use Carbonated beverages. No alcohol use No drug use Tobacco use Never smoker.  Family History (Valerie Hull, [place]; 09/27/2019 9:11 AM) Alcohol Abuse Father. Arthritis Mother. Heart Disease Father, Sister.  Pregnancy / Birth History (Valerie Hull, [place]; 09/27/2019 9:11 AM) Age at menarche 19 years. Age of menopause <46 Gravida 0 Para 0  Other Problems (Valerie Hull, [place]; 09/27/2019 9:11 AM) Back Pain Diverticulosis Gastroesophageal Reflux Disease Hemorrhoids Hypercholesterolemia     Review of Systems (Valerie A. Brown RMA; 09/27/2019 9:11 AM) General Present- Weight Gain. Not Present- Appetite Loss, Chills, Fatigue, Fever, Night Sweats and Weight Loss. Skin Present- Dryness. Not Present- Change in Wart/Mole, Hives, Jaundice, New Lesions, Non-Healing Wounds, Rash and Ulcer. HEENT Present- Sinus Pain and Wears glasses/contact lenses. Not Present- Earache, Hearing Loss, Hoarseness, Nose Bleed, Oral Ulcers, Ringing in the Ears, Seasonal Allergies, Sore Throat, Visual Disturbances and Yellow Eyes. Respiratory Present- Snoring. Not Present-  Bloody sputum, Chronic Cough, Difficulty Breathing and Wheezing. Cardiovascular Not Present- Chest Pain, Difficulty Breathing Lying Down, Leg Cramps, Palpitations, Rapid Heart Rate, Shortness of Breath and Swelling of Extremities. Gastrointestinal Present- Constipation and Indigestion. Not Present- Abdominal Pain,  Bloating, Bloody Stool, Change in Bowel Habits, Chronic diarrhea, Difficulty Swallowing, Excessive gas, Gets full quickly at meals, Hemorrhoids, Nausea, Rectal Pain and Vomiting. Female Genitourinary Not Present- Frequency, Nocturia, Painful Urination, Pelvic Pain and Urgency. Musculoskeletal Present- Back Pain. Not Present- Joint Pain, Joint Stiffness, Muscle Pain, Muscle Weakness and Swelling of Extremities. Neurological Not Present- Decreased Memory, Fainting, Headaches, Numbness, Seizures, Tingling, Tremor, Trouble walking and Weakness. Psychiatric Not Present- Anxiety, Bipolar, Change in Sleep Pattern, Depression, Fearful and Frequent crying. Endocrine Not Present- Cold Intolerance, Excessive Hunger, Hair Changes, Heat Intolerance, Hot flashes and New Diabetes. Hematology Not Present- Blood Thinners, Easy Bruising, Excessive bleeding, Gland problems, HIV and Persistent Infections.  Vitals (Valerie A. Brown RMA; 09/27/2019 9:12 AM) 09/27/2019 9:11 AM Weight: 117.6 lb Height: 58.5in Body Surface Area: 1.46 m Body Mass Index: 24.16 kg/m  Temp.: 97.66F  Pulse: 104 (Regular)  BP: 138/86 (Sitting, Left Arm, Standard)        Physical Exam Adin Hector MD; 09/27/2019 9:50 AM)  General Mental Status-Alert. General Appearance-Not in acute distress, Not Sickly. Orientation-Oriented X3. Hydration-Well hydrated. Voice-Normal.  Integumentary Global Assessment Upon inspection and palpation of skin surfaces of the - Axillae: non-tender, no inflammation or ulceration, no drainage. and Distribution of scalp and body hair is normal. General Characteristics  Temperature - normal warmth is noted.  Head and Neck Head-normocephalic, atraumatic with no lesions or palpable masses. Face Global Assessment - atraumatic, no absence of expression. Neck Global Assessment - no abnormal movements, no bruit auscultated on the right, no bruit auscultated on the left, no decreased range of motion, non-tender. Trachea-midline. Thyroid Gland Characteristics - non-tender.  Eye Eyeball - Left-Extraocular movements intact, No Nystagmus - Left. Eyeball - Right-Extraocular movements intact, No Nystagmus - Right. Cornea - Left-No Hazy - Left. Cornea - Right-No Hazy - Right. Sclera/Conjunctiva - Left-No scleral icterus, No Discharge - Left. Sclera/Conjunctiva - Right-No scleral icterus, No Discharge - Right. Pupil - Left-Direct reaction to light normal. Pupil - Right-Direct reaction to light normal. Note: Inward gaze of right eye. Wears glasses. Vision corrected  ENMT Ears Pinna - Left - no drainage observed, no generalized tenderness observed. Pinna - Right - no drainage observed, no generalized tenderness observed. Nose and Sinuses External Inspection of the Nose - no destructive lesion observed. Inspection of the nares - Left - quiet respiration. Inspection of the nares - Right - quiet respiration. Mouth and Throat Lips - Upper Lip - no fissures observed, no pallor noted. Lower Lip - no fissures observed, no pallor noted. Nasopharynx - no discharge present. Oral Cavity/Oropharynx - Tongue - no dryness observed. Oral Mucosa - no cyanosis observed. Hypopharynx - no evidence of airway distress observed. Note: Hard of hearing  Chest and Lung Exam Inspection Movements - Normal and Symmetrical. Accessory muscles - No use of accessory muscles in breathing. Palpation Palpation of the chest reveals - Non-tender. Auscultation Breath sounds - Normal and Clear.  Cardiovascular Auscultation Rhythm - Regular. Murmurs & Other Heart Sounds -  Auscultation of the heart reveals - No Murmurs and No Systolic Clicks.  Abdomen Inspection Inspection of the abdomen reveals - No Visible peristalsis and No Abnormal pulsations. Umbilicus - No Bleeding, No Urine drainage. Palpation/Percussion Palpation and Percussion of the abdomen reveal - Soft, Non Tender, No Rebound tenderness, No Rigidity (guarding) and No Cutaneous hyperesthesia. Note: Abdomen soft. Not severely distended. No distasis recti. No umbilical or other anterior abdominal wall hernias  Female Genitourinary Sexual Maturity Tanner 5 - Adult hair pattern. Note: No vaginal  bleeding nor discharge  Rectal Note: Please refer to anoscopy section. External hemorrhoids with at least grade 2 probable 3 right posterior internal hemorrhoid as well.  Peripheral Vascular Upper Extremity Inspection - Left - No Cyanotic nailbeds - Left, Not Ischemic. Inspection - Right - No Cyanotic nailbeds - Right, Not Ischemic.  Neurologic Neurologic evaluation reveals -normal attention span and ability to concentrate, able to name objects and repeat phrases. Appropriate fund of knowledge , normal sensation and normal coordination. Mental Status Affect - not angry, not paranoid. Cranial Nerves-Normal Bilaterally. Gait-Normal.  Neuropsychiatric Mental status exam performed with findings of-able to articulate well with normal speech/language, rate, volume and coherence, thought content normal with ability to perform basic computations and apply abstract reasoning and no evidence of hallucinations, delusions, obsessions or homicidal/suicidal ideation.  Musculoskeletal Global Assessment Spine, Ribs and Pelvis - no instability, subluxation or laxity. Right Upper Extremity - no instability, subluxation or laxity.  Lymphatic Head & Neck  General Head & Neck Lymphatics: Bilateral - Description - No Localized lymphadenopathy. Axillary  General Axillary Region: Bilateral - Description -  No Localized lymphadenopathy. Femoral & Inguinal  Generalized Femoral & Inguinal Lymphatics: Left - Description - No Localized lymphadenopathy. Right - Description - No Localized lymphadenopathy.   Results Adin Hector MD; 09/27/2019 12:29 PM) Procedures  Name Value Date Hemorrhoids Procedure Anal exam: External Hemorrhoid Internal exam: Internal Hemorroids ( non-bleeding) Internal Hemorrhoids (bleeding) Other: Enlarged right posterior internal hemorrhoid with some partial prolapse. Friable. Scarring left lateral hemorrhoidal aspect more persistent grade 1/2 hemorrhoid. Right anterior was some scarring and irritation as well. Probably grade 2. Left lateral external hemorrhoidal tags. Right anterior external hemorrhoidal tag............Marland KitchenPerianal skin clean with good hygiene. No pruritis ani. No pilonidal disease. No fissure. No abscess/fistula. Normal sphincter tone. ....Marland KitchenMarland KitchenNo condyloma warts. Tolerates digital and anoscopic rectal exam. No rectal masses. Hemorrhoidal piles normal. Redundant rectum with internal rectal prolapse proximal to middle. However not going out the actual sphincter complex. Mild rectocele....Marland KitchenMarland KitchenExam done with assistance of female Medical Assistant in the room.  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 No other proximal source. Has had repeated banding's. She thinks at least 5 times. At least a few this year with persistent bleeding.  While I could potentially band the right posterior one, I'm skeptical work sixth time. Patient wishes to be more aggressively consider surgery. Would plan hemorrhoidal ligation and pexy. Removal of external masses. She is interested in proceeding.  I strongly recommend that she corrector constipation and increase her Benefiber. Go from once a day to at  least twice a day. Can go up to 6 doses a day. Still struggling, revisit with her gastroenterologist. The hemorrhoids are side effect of her constipation and irritable bowel. Get that under control, and I will help keep the hemorrhoids from worsening.  The anatomy & physiology of the anorectal region was discussed. The pathophysiology of hemorrhoids and differential diagnosis was discussed. Natural history progression was discussed. I stressed the importance of a bowel regimen to have daily soft bowel movements to minimize progression of disease. Goal of one BM / day ideal. Use of wet wipes, warm baths, avoiding straining, etc were emphasized.  Educational handouts further explaining the pathology, treatment options, and bowel regimen were given as well. The patient expressed understanding.  Current Plans You are being scheduled for surgery- Our schedulers will call you.  You should hear  from our office's scheduling department within 5 working days about the location, date, and time of surgery. We try to make accommodations for patient's preferences in scheduling surgery, but sometimes the OR schedule or the surgeon's schedule prevents [place] from making those accommodations.  If you have not heard from our office [phone]) in 5 working days, call the office and ask for your surgeon's nurse.  If you have other questions about your diagnosis, plan, or surgery, call the office and ask for your surgeon's nurse.  Pt Education - Pamphlet Given - The Hemorrhoid Book: discussed with patient and provided information. The anatomy & physiology of the anorectal region was discussed. The pathophysiology of hemorrhoids and differential diagnosis was discussed. Natural history risks without surgery was discussed. I stressed the importance of a bowel regimen to have daily soft bowel movements to minimize progression of disease. Interventions such as sclerotherapy & banding were discussed.  The patient's  symptoms are not adequately controlled by medicines and other non-operative treatments. I feel the risks & problems of no surgery outweigh the operative risks; therefore, I recommended surgery to treat the hemorrhoids by ligation, pexy, and possible resection.  Risks such as bleeding, infection, urinary difficulties, need for further treatment, heart attack, death, and other risks were discussed. I noted a good likelihood this will help address the problem. Goals of post-operative recovery were discussed as well. Possibility that this will not correct all symptoms was explained. Post-operative pain, bleeding, constipation, and other problems after surgery were discussed. We will work to minimize complications. Educational handouts further explaining the pathology, treatment options, and bowel regimen were given as well. Questions were answered. The patient expresses understanding & wishes to proceed with surgery.   PROLAPSED INTERNAL HEMORRHOIDS, GRADE 2 (K64.1)  Current Plans ANOSCOPY, DIAGNOSTIC [id number]) Pt Education - [facility] Hemorrhoids (Laurelin Elson): discussed with patient and provided information.  EXTERNAL HEMORRHOIDS WITH COMPLICATION (0000000)   ENCOUNTER FOR PREOPERATIVE EXAMINATION FOR GENERAL SURGICAL PROCEDURE (Z01.818)  Current Plans Pt Education - [facility] Rectal Prep for Anorectal outpatient/office surgery: discussed with patient and provided information. Pt Education - [facility] Rectal Surgery HCI (Derin Granquist): discussed with patient and provided information.  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 For follow-up screening per her gastroenterologist.  Current Plans Pt Education - Polyps in the Colon and Rectum (Colonic  and Rectal Polyps): colonic polyps  Adin Hector, MD, FACS, MASCRS Gastrointestinal and Minimally Invasive Surgery  [facility] [address] [place], [place] [address] [phone] Main / Paging [phone] Fax

## 2019-10-04 DIAGNOSIS — M5416 Radiculopathy, lumbar region: Secondary | ICD-10-CM | POA: Diagnosis not present

## 2019-10-04 DIAGNOSIS — E7800 Pure hypercholesterolemia, unspecified: Secondary | ICD-10-CM | POA: Diagnosis not present

## 2019-10-04 DIAGNOSIS — M810 Age-related osteoporosis without current pathological fracture: Secondary | ICD-10-CM | POA: Diagnosis not present

## 2019-10-04 DIAGNOSIS — Z0000 Encounter for general adult medical examination without abnormal findings: Secondary | ICD-10-CM | POA: Diagnosis not present

## 2019-10-04 DIAGNOSIS — Z8262 Family history of osteoporosis: Secondary | ICD-10-CM | POA: Diagnosis not present

## 2019-10-04 DIAGNOSIS — F419 Anxiety disorder, unspecified: Secondary | ICD-10-CM | POA: Diagnosis not present

## 2019-10-04 DIAGNOSIS — K219 Gastro-esophageal reflux disease without esophagitis: Secondary | ICD-10-CM | POA: Diagnosis not present

## 2019-10-04 DIAGNOSIS — K589 Irritable bowel syndrome without diarrhea: Secondary | ICD-10-CM | POA: Diagnosis not present

## 2019-10-04 DIAGNOSIS — Z1231 Encounter for screening mammogram for malignant neoplasm of breast: Secondary | ICD-10-CM | POA: Diagnosis not present

## 2019-10-04 DIAGNOSIS — K649 Unspecified hemorrhoids: Secondary | ICD-10-CM | POA: Diagnosis not present

## 2019-10-07 DIAGNOSIS — H04123 Dry eye syndrome of bilateral lacrimal glands: Secondary | ICD-10-CM | POA: Diagnosis not present

## 2019-10-07 DIAGNOSIS — H31003 Unspecified chorioretinal scars, bilateral: Secondary | ICD-10-CM | POA: Diagnosis not present

## 2019-10-07 DIAGNOSIS — H5213 Myopia, bilateral: Secondary | ICD-10-CM | POA: Diagnosis not present

## 2019-10-07 DIAGNOSIS — Z961 Presence of intraocular lens: Secondary | ICD-10-CM | POA: Diagnosis not present

## 2019-10-21 ENCOUNTER — Other Ambulatory Visit: Payer: Self-pay | Admitting: Neurosurgery

## 2019-10-21 DIAGNOSIS — M5416 Radiculopathy, lumbar region: Secondary | ICD-10-CM

## 2019-11-14 ENCOUNTER — Other Ambulatory Visit: Payer: Self-pay

## 2019-11-14 ENCOUNTER — Ambulatory Visit
Admission: RE | Admit: 2019-11-14 | Discharge: 2019-11-14 | Disposition: A | Discharge status: Home / Self Care | Payer: Medicare Other | Source: Ambulatory Visit | Attending: Neurosurgery | Admitting: Neurosurgery

## 2019-11-14 DIAGNOSIS — M5416 Radiculopathy, lumbar region: Secondary | ICD-10-CM

## 2019-11-14 DIAGNOSIS — M48061 Spinal stenosis, lumbar region without neurogenic claudication: Secondary | ICD-10-CM | POA: Diagnosis not present

## 2019-11-20 ENCOUNTER — Encounter: Payer: Self-pay | Admitting: *Deleted

## 2019-11-20 ENCOUNTER — Ambulatory Visit: Payer: Medicare Other | Attending: Internal Medicine

## 2019-11-20 DIAGNOSIS — Z23 Encounter for immunization: Secondary | ICD-10-CM | POA: Diagnosis not present

## 2019-11-20 NOTE — Progress Notes (Unsigned)
   Covid-19 Vaccination Clinic  Name:  Valerie Hull    MRN: [id number] DOB: 02/18/1941  11/20/2019  Valerie Hull was observed post Covid-19 immunization for 15 minutes without incidence. She was provided with Vaccine Information Sheet and instruction to access the V-Safe system.   Valerie Hull was instructed to call 911 with any severe reactions post vaccine: Marland Kitchen Difficulty breathing  . Swelling of your face and throat  . A fast heartbeat  . A bad rash all over your body  . Dizziness and weakness

## 2019-11-21 ENCOUNTER — Other Ambulatory Visit: Payer: Self-pay

## 2019-11-21 ENCOUNTER — Encounter (HOSPITAL_BASED_OUTPATIENT_CLINIC_OR_DEPARTMENT_OTHER): Payer: Self-pay | Admitting: Surgery

## 2019-11-21 DIAGNOSIS — K219 Gastro-esophageal reflux disease without esophagitis: Secondary | ICD-10-CM | POA: Diagnosis not present

## 2019-11-21 DIAGNOSIS — E7800 Pure hypercholesterolemia, unspecified: Secondary | ICD-10-CM | POA: Diagnosis not present

## 2019-11-21 DIAGNOSIS — M810 Age-related osteoporosis without current pathological fracture: Secondary | ICD-10-CM | POA: Diagnosis not present

## 2019-11-21 NOTE — Progress Notes (Addendum)
Spoke w/ via phone for pre-op interview---[facility] needs dos----  I stat 8          Lab results------ COVID test ------11-22-2019  Arrive at -------900 am 11-25-2019 NPO after ------liquids day before surgery until 800 am day of surgery then npo Medications to take morning of surgery -----omeprazole, gabapentin Diabetic medication -----n/a Patient Special Instructions -----bowel prep instructions from dr gross Pre-Op special Istructions ----- Patient verbalized understanding of instructions that were given at this phone interview. Patient denies shortness of breath, chest pain, fever, cough a this phone interview.

## 2019-11-22 ENCOUNTER — Other Ambulatory Visit (HOSPITAL_COMMUNITY)
Admission: RE | Admit: 2019-11-22 | Discharge: 2019-11-22 | Disposition: A | Discharge status: Home / Self Care | Payer: Medicare Other | Source: Ambulatory Visit | Attending: Surgery | Admitting: Surgery

## 2019-11-22 DIAGNOSIS — Z01812 Encounter for preprocedural laboratory examination: Secondary | ICD-10-CM | POA: Diagnosis not present

## 2019-11-22 DIAGNOSIS — Z20822 Contact with and (suspected) exposure to covid-19: Secondary | ICD-10-CM | POA: Insufficient documentation

## 2019-11-23 LAB — NOVEL CORONAVIRUS, NAA (HOSP ORDER, SEND-OUT TO REF LAB; TAT 18-24 HRS): SARS-CoV-2, NAA: NOT DETECTED

## 2019-11-24 NOTE — Progress Notes (Signed)
Noted pt surgery has time change on schedule.  Called and spoke w/ pt via phone to inform her of this.  Pt verbalized understanding to arrive at 0730 and be npo after MN w/ exception clear liquids until 0630, then nothing by mouth, only sips of water with medication and may take tylenol sinus if needed.

## 2019-11-24 NOTE — Anesthesia Preprocedure Evaluation (Addendum)
Anesthesia Evaluation  Patient identified by MRN, date of birth, ID band Patient awake    Reviewed: Allergy & Precautions, NPO status , Patient's Chart, lab work & pertinent test results  History of Anesthesia Complications (+) PONV and history of anesthetic complications  Airway Mallampati: II  TM Distance: >3 FB Neck ROM: Full    Dental no notable dental hx.    Pulmonary neg pulmonary ROS,    Pulmonary exam normal        Cardiovascular negative cardio ROS Normal cardiovascular exam     Neuro/Psych negative neurological ROS  negative psych ROS   GI/Hepatic Neg liver ROS, hiatal hernia, GERD  Medicated and Controlled,Hemorrhoids   Endo/Other  negative endocrine ROS  Renal/GU negative Renal ROS  negative genitourinary   Musculoskeletal  (+) Arthritis ,   Abdominal   Peds  Hematology negative hematology ROS (+)   Anesthesia Other Findings Day of surgery medications reviewed with patient.  Reproductive/Obstetrics negative OB ROS                           Anesthesia Physical Anesthesia Plan  ASA: II  Anesthesia Plan: General   Post-op Pain Management:    Induction: Intravenous  PONV Risk Score and Plan: 4 or greater and Treatment may vary due to age or medical condition, Ondansetron, Propofol infusion and TIVA  Airway Management Planned: Oral ETT  Additional Equipment: None  Intra-op Plan:   Post-operative Plan: Extubation in OR  Informed Consent: I have reviewed the patients History and Physical, chart, labs and discussed the procedure including the risks, benefits and alternatives for the proposed anesthesia with the patient or authorized representative who has indicated his/her understanding and acceptance.     Dental advisory given  Plan Discussed with: CRNA  Anesthesia Plan Comments:       Anesthesia Quick Evaluation

## 2019-11-25 ENCOUNTER — Ambulatory Visit (HOSPITAL_BASED_OUTPATIENT_CLINIC_OR_DEPARTMENT_OTHER): Payer: Medicare Other | Admitting: Anesthesiology

## 2019-11-25 ENCOUNTER — Other Ambulatory Visit: Payer: Self-pay

## 2019-11-25 ENCOUNTER — Ambulatory Visit (HOSPITAL_BASED_OUTPATIENT_CLINIC_OR_DEPARTMENT_OTHER)
Admission: RE | Admit: 2019-11-25 | Discharge: 2019-11-25 | Disposition: A | Discharge status: Home / Self Care | Payer: Medicare Other | Attending: Surgery | Admitting: Surgery

## 2019-11-25 ENCOUNTER — Encounter (HOSPITAL_BASED_OUTPATIENT_CLINIC_OR_DEPARTMENT_OTHER): Payer: Self-pay | Admitting: Surgery

## 2019-11-25 ENCOUNTER — Encounter (HOSPITAL_BASED_OUTPATIENT_CLINIC_OR_DEPARTMENT_OTHER)
Admission: RE | Disposition: A | Discharge status: Home / Self Care | Payer: Self-pay | Source: Home / Self Care | Attending: Surgery

## 2019-11-25 DIAGNOSIS — Z882 Allergy status to sulfonamides status: Secondary | ICD-10-CM | POA: Diagnosis not present

## 2019-11-25 DIAGNOSIS — K649 Unspecified hemorrhoids: Secondary | ICD-10-CM | POA: Diagnosis not present

## 2019-11-25 DIAGNOSIS — K644 Residual hemorrhoidal skin tags: Secondary | ICD-10-CM | POA: Diagnosis not present

## 2019-11-25 DIAGNOSIS — Z880 Allergy status to penicillin: Secondary | ICD-10-CM | POA: Diagnosis not present

## 2019-11-25 DIAGNOSIS — K219 Gastro-esophageal reflux disease without esophagitis: Secondary | ICD-10-CM | POA: Diagnosis not present

## 2019-11-25 DIAGNOSIS — Z888 Allergy status to other drugs, medicaments and biological substances status: Secondary | ICD-10-CM | POA: Insufficient documentation

## 2019-11-25 DIAGNOSIS — K642 Third degree hemorrhoids: Secondary | ICD-10-CM | POA: Insufficient documentation

## 2019-11-25 DIAGNOSIS — Z79899 Other long term (current) drug therapy: Secondary | ICD-10-CM | POA: Insufficient documentation

## 2019-11-25 DIAGNOSIS — M1990 Unspecified osteoarthritis, unspecified site: Secondary | ICD-10-CM | POA: Diagnosis not present

## 2019-11-25 DIAGNOSIS — K625 Hemorrhage of anus and rectum: Secondary | ICD-10-CM | POA: Diagnosis not present

## 2019-11-25 DIAGNOSIS — K581 Irritable bowel syndrome with constipation: Secondary | ICD-10-CM | POA: Diagnosis not present

## 2019-11-25 DIAGNOSIS — Z885 Allergy status to narcotic agent status: Secondary | ICD-10-CM | POA: Insufficient documentation

## 2019-11-25 HISTORY — PX: EVALUATION UNDER ANESTHESIA WITH HEMORRHOIDECTOMY: SHX5624

## 2019-11-25 HISTORY — DX: Unspecified osteoarthritis, unspecified site: M19.90

## 2019-11-25 HISTORY — DX: Personal history of other diseases of the digestive system: Z87.19

## 2019-11-25 HISTORY — DX: Nausea with vomiting, unspecified: R11.2

## 2019-11-25 HISTORY — DX: Unspecified hemorrhoids: K64.9

## 2019-11-25 HISTORY — DX: Scoliosis, unspecified: M41.9

## 2019-11-25 HISTORY — DX: Other specified postprocedural states: Z98.890

## 2019-11-25 LAB — POCT I-STAT, CHEM 8
BUN: 15 mg/dL (ref 8–23)
Calcium, Ion: 1.22 mmol/L (ref 1.15–1.40)
Chloride: 102 mmol/L (ref 98–111)
Creatinine, Ser: 0.8 mg/dL (ref 0.44–1.00)
Glucose, Bld: 81 mg/dL (ref 70–99)
HCT: 43 % (ref 36.0–46.0)
Hemoglobin: 14.6 g/dL (ref 12.0–15.0)
Potassium: 4.3 mmol/L (ref 3.5–5.1)
Sodium: 142 mmol/L (ref 135–145)
TCO2: 32 mmol/L (ref 22–32)

## 2019-11-25 SURGERY — EXAM UNDER ANESTHESIA WITH HEMORRHOIDECTOMY
Anesthesia: General | Site: Rectum

## 2019-11-25 MED ORDER — ACETAMINOPHEN 500 MG PO TABS
1000.0000 mg | ORAL_TABLET | Freq: Once | ORAL | Status: DC
  Filled 2019-11-25: qty 2

## 2019-11-25 MED ORDER — LIDOCAINE HCL (CARDIAC) PF 100 MG/5ML IV SOSY
PREFILLED_SYRINGE | INTRAVENOUS | Status: DC | PRN
  Administered 2019-11-25: 20 mg via INTRAVENOUS
  Administered 2019-11-25: 40 mg via INTRAVENOUS

## 2019-11-25 MED ORDER — BUPIVACAINE HCL 0.25 % IJ SOLN
INTRAMUSCULAR | Status: DC | PRN
  Administered 2019-11-25: 20 mL

## 2019-11-25 MED ORDER — CHLORHEXIDINE GLUCONATE CLOTH 2 % EX PADS
6.0000 | MEDICATED_PAD | Freq: Once | CUTANEOUS | Status: DC
  Filled 2019-11-25: qty 6

## 2019-11-25 MED ORDER — PROMETHAZINE HCL 25 MG/ML IJ SOLN
6.2500 mg | INTRAMUSCULAR | Status: DC | PRN
  Filled 2019-11-25: qty 1

## 2019-11-25 MED ORDER — FENTANYL CITRATE (PF) 100 MCG/2ML IJ SOLN
INTRAMUSCULAR | Status: AC
  Filled 2019-11-25: qty 2

## 2019-11-25 MED ORDER — FENTANYL CITRATE (PF) 100 MCG/2ML IJ SOLN
25.0000 ug | INTRAMUSCULAR | Status: DC | PRN
  Filled 2019-11-25: qty 1

## 2019-11-25 MED ORDER — FENTANYL CITRATE (PF) 100 MCG/2ML IJ SOLN
INTRAMUSCULAR | Status: DC | PRN
  Administered 2019-11-25: 50 ug via INTRAVENOUS

## 2019-11-25 MED ORDER — BUPIVACAINE LIPOSOME 1.3 % IJ SUSP
INTRAMUSCULAR | Status: DC | PRN
  Administered 2019-11-25: 20 mL

## 2019-11-25 MED ORDER — DIBUCAINE (PERIANAL) 1 % EX OINT
TOPICAL_OINTMENT | CUTANEOUS | Status: DC | PRN
  Administered 2019-11-25: 1 via RECTAL

## 2019-11-25 MED ORDER — OXYCODONE HCL 5 MG PO TABS
ORAL_TABLET | ORAL | Status: AC
  Filled 2019-11-25: qty 1

## 2019-11-25 MED ORDER — OXYCODONE HCL 5 MG PO TABS: 5.0000 mg | ORAL_TABLET | Freq: Four times a day (QID) | ORAL | 0 refills | Status: DC | PRN

## 2019-11-25 MED ORDER — GABAPENTIN 300 MG PO CAPS
ORAL_CAPSULE | ORAL | Status: AC
  Filled 2019-11-25: qty 1

## 2019-11-25 MED ORDER — GABAPENTIN 300 MG PO CAPS
300.0000 mg | ORAL_CAPSULE | ORAL | Status: AC
  Administered 2019-11-25: 300 mg via ORAL
  Filled 2019-11-25: qty 1

## 2019-11-25 MED ORDER — ROCURONIUM BROMIDE 10 MG/ML (PF) SYRINGE
PREFILLED_SYRINGE | INTRAVENOUS | Status: AC
  Filled 2019-11-25: qty 10

## 2019-11-25 MED ORDER — OXYCODONE HCL 5 MG PO TABS
5.0000 mg | ORAL_TABLET | Freq: Once | ORAL | Status: AC
  Administered 2019-11-25: 5 mg via ORAL
  Filled 2019-11-25: qty 1

## 2019-11-25 MED ORDER — ONDANSETRON HCL 4 MG/2ML IJ SOLN
INTRAMUSCULAR | Status: AC
  Filled 2019-11-25: qty 2

## 2019-11-25 MED ORDER — ONDANSETRON HCL 4 MG/2ML IJ SOLN
INTRAMUSCULAR | Status: DC | PRN
  Administered 2019-11-25: 4 mg via INTRAVENOUS

## 2019-11-25 MED ORDER — DEXAMETHASONE SODIUM PHOSPHATE 10 MG/ML IJ SOLN
INTRAMUSCULAR | Status: AC
  Filled 2019-11-25: qty 1

## 2019-11-25 MED ORDER — ROCURONIUM BROMIDE 100 MG/10ML IV SOLN
INTRAVENOUS | Status: DC | PRN
  Administered 2019-11-25: 40 mg via INTRAVENOUS

## 2019-11-25 MED ORDER — SUGAMMADEX SODIUM 200 MG/2ML IV SOLN
INTRAVENOUS | Status: DC | PRN
  Administered 2019-11-25 (×2): 100 mg via INTRAVENOUS

## 2019-11-25 MED ORDER — LACTATED RINGERS IV SOLN
INTRAVENOUS | Status: DC
  Filled 2019-11-25: qty 1000

## 2019-11-25 MED ORDER — GENTAMICIN SULFATE 40 MG/ML IJ SOLN
5.0000 mg/kg | INTRAVENOUS | Status: AC
  Administered 2019-11-25: 09:00:00 270 mg via INTRAVENOUS
  Filled 2019-11-25: qty 6.75

## 2019-11-25 MED ORDER — PROPOFOL 500 MG/50ML IV EMUL
INTRAVENOUS | Status: DC | PRN
  Administered 2019-11-25: 100 ug/kg/min via INTRAVENOUS

## 2019-11-25 MED ORDER — ACETAMINOPHEN 500 MG PO TABS
1000.0000 mg | ORAL_TABLET | ORAL | Status: AC
  Administered 2019-11-25: 1000 mg via ORAL
  Filled 2019-11-25: qty 2

## 2019-11-25 MED ORDER — ACETAMINOPHEN 500 MG PO TABS
ORAL_TABLET | ORAL | Status: AC
  Filled 2019-11-25: qty 2

## 2019-11-25 MED ORDER — PHENYLEPHRINE HCL (PRESSORS) 10 MG/ML IV SOLN
INTRAVENOUS | Status: DC | PRN
  Administered 2019-11-25: 80 ug via INTRAVENOUS
  Administered 2019-11-25: 40 ug via INTRAVENOUS
  Administered 2019-11-25: 80 ug via INTRAVENOUS
  Administered 2019-11-25: 40 ug via INTRAVENOUS
  Administered 2019-11-25 (×3): 80 ug via INTRAVENOUS
  Administered 2019-11-25 (×3): 40 ug via INTRAVENOUS

## 2019-11-25 MED ORDER — ONDANSETRON HCL 4 MG/2ML IJ SOLN
4.0000 mg | Freq: Once | INTRAMUSCULAR | Status: AC
  Administered 2019-11-25: 11:00:00 4 mg via INTRAVENOUS
  Filled 2019-11-25: qty 2

## 2019-11-25 MED ORDER — BUPIVACAINE LIPOSOME 1.3 % IJ SUSP
20.0000 mL | Freq: Once | INTRAMUSCULAR | Status: DC
  Filled 2019-11-25: qty 20

## 2019-11-25 MED ORDER — PROPOFOL 10 MG/ML IV BOLUS
INTRAVENOUS | Status: DC | PRN
  Administered 2019-11-25: 90 mg via INTRAVENOUS

## 2019-11-25 MED ORDER — CLINDAMYCIN PHOSPHATE 900 MG/50ML IV SOLN
INTRAVENOUS | Status: AC
  Filled 2019-11-25: qty 50

## 2019-11-25 MED ORDER — LIDOCAINE 2% (20 MG/ML) 5 ML SYRINGE
INTRAMUSCULAR | Status: AC
  Filled 2019-11-25: qty 5

## 2019-11-25 MED ORDER — PHENYLEPHRINE 40 MCG/ML (10ML) SYRINGE FOR IV PUSH (FOR BLOOD PRESSURE SUPPORT)
PREFILLED_SYRINGE | INTRAVENOUS | Status: AC
  Filled 2019-11-25: qty 10

## 2019-11-25 MED ORDER — CLINDAMYCIN PHOSPHATE 900 MG/50ML IV SOLN
900.0000 mg | INTRAVENOUS | Status: AC
  Administered 2019-11-25: 09:00:00 900 mg via INTRAVENOUS
  Filled 2019-11-25: qty 50

## 2019-11-25 SURGICAL SUPPLY — 58 items
BENZOIN TINCTURE PRP APPL 2/3 (GAUZE/BANDAGES/DRESSINGS) ×2 IMPLANT
BLADE HEX COATED 2.75 (ELECTRODE) ×2 IMPLANT
BLADE SURG 10 STRL SS (BLADE) IMPLANT
BLADE SURG 15 STRL LF DISP TIS (BLADE) ×1 IMPLANT
BLADE SURG 15 STRL SS (BLADE) ×1
BRIEF STRETCH FOR OB PAD LRG (UNDERPADS AND DIAPERS) ×2 IMPLANT
CANISTER SUCTION 1200CC (MISCELLANEOUS) ×2 IMPLANT
COVER BACK TABLE 60X90IN (DRAPES) ×2 IMPLANT
COVER MAYO STAND STRL (DRAPES) ×2 IMPLANT
COVER WAND RF STERILE (DRAPES) ×2 IMPLANT
DECANTER SPIKE VIAL GLASS SM (MISCELLANEOUS) ×2 IMPLANT
DRAPE HYSTEROSCOPY (DRAPE) IMPLANT
DRAPE LAPAROTOMY 100X72 PEDS (DRAPES) ×2 IMPLANT
DRAPE SHEET LG 3/4 BI-LAMINATE (DRAPES) ×2 IMPLANT
DRSG PAD ABDOMINAL 8X10 ST (GAUZE/BANDAGES/DRESSINGS) IMPLANT
ELECT NEEDLE TIP 2.8 STRL (NEEDLE) IMPLANT
ELECT REM PT RETURN 9FT ADLT (ELECTROSURGICAL) ×2
ELECTRODE REM PT RTRN 9FT ADLT (ELECTROSURGICAL) ×1 IMPLANT
FILTER STRAW (MISCELLANEOUS) IMPLANT
GLOVE BIOGEL PI IND STRL 7.5 (GLOVE) ×1 IMPLANT
GLOVE BIOGEL PI IND STRL 8.5 (GLOVE) ×2 IMPLANT
GLOVE BIOGEL PI INDICATOR 7.5 (GLOVE) ×1
GLOVE BIOGEL PI INDICATOR 8.5 (GLOVE) ×2
GLOVE ECLIPSE 8.0 STRL XLNG CF (GLOVE) ×2 IMPLANT
GLOVE INDICATOR 8.0 STRL GRN (GLOVE) ×2 IMPLANT
GLOVE INDICATOR 8.5 STRL (GLOVE) ×4 IMPLANT
GOWN STRL REUS W/TWL XL LVL3 (GOWN DISPOSABLE) ×6 IMPLANT
IV CATH PLACEMENT 20 GA (IV SOLUTION) IMPLANT
KIT SIGMOIDOSCOPE (SET/KITS/TRAYS/PACK) IMPLANT
KIT TURNOVER CYSTO (KITS) ×2 IMPLANT
LEGGING LITHOTOMY PAIR STRL (DRAPES) IMPLANT
NEEDLE HYPO 22GX1.5 SAFETY (NEEDLE) ×2 IMPLANT
NS IRRIG 500ML POUR BTL (IV SOLUTION) ×2 IMPLANT
PACK BASIN DAY SURGERY FS (CUSTOM PROCEDURE TRAY) ×2 IMPLANT
PAD ABD 8X10 STRL (GAUZE/BANDAGES/DRESSINGS) ×2 IMPLANT
PAD PREP 24X48 CUFFED NSTRL (MISCELLANEOUS) IMPLANT
PENCIL BUTTON HOLSTER BLD 10FT (ELECTRODE) ×2 IMPLANT
SCRUB TECHNI CARE 4 OZ NO DYE (MISCELLANEOUS) ×2 IMPLANT
SHEARS HARMONIC 9CM CVD (BLADE) IMPLANT
SURGILUBE 2OZ TUBE FLIPTOP (MISCELLANEOUS) ×2 IMPLANT
SUT CHROMIC 2 0 SH (SUTURE) ×4 IMPLANT
SUT CHROMIC 3 0 SH 27 (SUTURE) IMPLANT
SUT VIC AB 2-0 SH 27 (SUTURE)
SUT VIC AB 2-0 SH 27XBRD (SUTURE) IMPLANT
SUT VIC AB 2-0 UR6 27 (SUTURE) ×14 IMPLANT
SUT VICRYL 0 UR6 27IN ABS (SUTURE) IMPLANT
SUT VICRYL AB 2 0 TIE (SUTURE) IMPLANT
SUT VICRYL AB 2 0 TIES (SUTURE)
SYR 20ML LL LF (SYRINGE) ×2 IMPLANT
SYR 27GX1/2 1ML LL SAFETY (SYRINGE) ×2 IMPLANT
SYR BULB IRRIGATION 50ML (SYRINGE) ×2 IMPLANT
SYR CONTROL 10ML LL (SYRINGE) IMPLANT
TAPE CLOTH 3X10 TAN LF (GAUZE/BANDAGES/DRESSINGS) IMPLANT
TOWEL OR 17X26 10 PK STRL BLUE (TOWEL DISPOSABLE) ×2 IMPLANT
TRAY DSU PREP LF (CUSTOM PROCEDURE TRAY) ×2 IMPLANT
TUBE CONNECTING 12X1/4 (SUCTIONS) ×2 IMPLANT
UNDERPAD 30X30 (UNDERPADS AND DIAPERS) ×2 IMPLANT
YANKAUER SUCT BULB TIP NO VENT (SUCTIONS) ×2 IMPLANT

## 2019-11-25 NOTE — Op Note (Signed)
11/25/2019  10:14 AM  PATIENT:  Valerie Hull  79 y.o. female  Patient Care Team: Leighton Ruff, MD as PCP - General (Family Medicine) Michael Boston, MD as Consulting Physician (General Surgery) Richmond Campbell, MD as Consulting Physician (Gastroenterology)  PRE-OPERATIVE DIAGNOSIS:  HEMORRHOIDS PROLAPSING GRADE 3 WITH BLEEDING AND WITH PAIN  POST-OPERATIVE DIAGNOSIS:  HEMORRHOIDS PROLAPSING GRADE 3 WITH BLEEDING AND WITH PAIN EXTERNAL HEMORRHOIDS WITH IRRITATION  PROCEDURE:   Internal and external hemorrhoidectomy  x3 Internal hemorrhoidal ligation and pexy Anorectal examination under anesthesia  SURGEON:  Adin Hector, MD  ANESTHESIA:   General Anorectal & Local field block (0.25% bupivacaine with epinephrine mixed with Liposomal bupivacaine (Experel)   EBL:  Total I/O In: 806.8 [I.V.:700; IV Piggyback:106.8] Out: 50 [Blood:50].  See operative record  Delay start of Pharmacological VTE agent (>24hrs) due to surgical blood loss or risk of bleeding:  NO  DRAINS: NONE  SPECIMEN:   Internal & external hemorrhoidx3  DISPOSITION OF SPECIMEN:  PATHOLOGY  COUNTS:  YES  PLAN OF CARE: Discharge home after PACU  PATIENT DISPOSITION:  PACU - hemodynamically stable.  INDICATION: Pleasant patient with struggles with hemorrhoids.  Not able to be managed in the office despite an improved bowel regimen.  Prior banding's and still struggling with bleeding discomfort prolapse and irritation.  I recommended examination under anesthesia and surgical treatment:  The anatomy & physiology of the anorectal region was discussed.  The pathophysiology of hemorrhoids and differential diagnosis was discussed.  Natural history risks without surgery was discussed.   I stressed the importance of a bowel regimen to have daily soft bowel movements to minimize progression of disease.  Interventions such as sclerotherapy & banding were discussed.  The patient's symptoms are not adequately  controlled by medicines and other non-operative treatments.  I feel the risks & problems of no surgery outweigh the operative risks; therefore, I recommended surgery to treat the hemorrhoids by ligation, pexy, and possible resection.  Risks such as bleeding, infection, need for further treatment, heart attack, death, and other risks were discussed.   I noted a good likelihood this will help address the problem.  Goals of post-operative recovery were discussed as well.  Possibility that this will not correct all symptoms was explained.  Post-operative pain, bleeding, constipation, urinary difficulties, and other problems after surgery were discussed.  We will work to minimize complications.   Educational handouts further explaining the pathology, treatment options, and bowel regimen were given as well.  Questions were answered.  The patient expresses understanding & wishes to proceed with surgery.  OR FINDINGS: Chronically inflamed hemorrhoids with scarring consistent with prior banding.  Persistent left posterior lateral and right anterior external hemorrhoid tags.  Internal hemorrhoidal ligation and pexy done.  Additional hemorrhoidectomy is done on left posterior, right anterior, right posterior aspects.  DESCRIPTION:   Informed consent was confirmed. Patient underwent general anesthesia without difficulty. Patient was placed into prone positioning.  The perianal region was prepped and draped in sterile fashion. Surgical time-out confirmed our plan.  I did digital rectal examination and then transitioned over to anoscopy to get a sense of the anatomy.  Findings noted above.   I proceeded to do hemorrhoidal ligation and pexy of the six hemorrhoidal columns.  I used a 2-0 Vicryl suture on a UR-6 needle in a figure-of-eight fashion 6 cm proximal to the anal verge.  I started at the largest hemorrhoid pile at the left posterior column.  I then ran that stitch longitudinally more  distally to close the  hemorrhoidectomy wound to the anal verge over a Parks self retaining retractor & occasionally a large Hill-Furgeson retractor to avoid narrowing of the anal canal.  I then tied that stitch down to cause a hemorrhoidopexy.   I then did hemorrhoidal ligation and pexy at the other columns.   Because of redundant hemorrhoidal tissue too bulky to merely ligate or pexy, I excised the excess internal hemorrhoid piles longitudinally in a fusiform biconcave fashion, at the  right anterior and right posterior location, sparing the anal canal to avoid narrowing. At the completion of this, all 6 anorectal columns were ligated and pexied in the classic hexagonal fashion (right anterior/lateral/posterior, left anterior/lateral/posterior).   I redid anoscopy & examination.  She still had some enlarged hemorrhoidal tissue in the right posterior aspect with redundant rectum.  I excised some additional hemorrhoidal tissue longitudinally and closed that with 2-0 Vicryl in a running fashion.  I excised some additional external hemorrhoid tissue on the right posterior side as well.   I closed the external part of the hemorrhoidectomy wounds with interrupted horizontal mattress 2-0 chromic suture, leaving the last 5 mm open to allow natural drainage.    At completion of this, all hemorrhoids had been removed or reduced into the rectum.  There is no more prolapse.  Internal & external anatomy was more more normal.  Hemostasis was good.  Fluffed gauze was on-laid over the perianal region.  No packing done.  Patient is being extubated go to go to the recovery room.  I had discussed postop care in detail with the patient in the preop holding area.  Instructions for post-operative recovery and prescriptions are written.I made an attempt to locate family to discuss patient's status and recommendations.  No one is available at this time.  Instructions and pain medications are written though.  We will work to follow her closely as an  outpatient  Adin Hector, M.D., F.A.C.S. Gastrointestinal and Minimally Invasive Surgery [facility] [address] [place], [place] [address] [phone] Main / Paging

## 2019-11-25 NOTE — Anesthesia Postprocedure Evaluation (Signed)
Anesthesia Post Note  Patient: Valerie Hull  Procedure(s) Performed: ANORECTAL EXAM UNDER ANESTHESIA WITH HEMORRHOIDECTOMY, HEMORRHOIDAL LIGATION/PEXY (N/A Rectum)     Patient location during evaluation: PACU Anesthesia Type: General Level of consciousness: awake and alert and oriented Pain management: pain level controlled Vital Signs Assessment: post-procedure vital signs reviewed and stable Respiratory status: spontaneous breathing, nonlabored ventilation and respiratory function stable Cardiovascular status: blood pressure returned to baseline Postop Assessment: no apparent nausea or vomiting Anesthetic complications: no    Last Vitals:  Vitals:   11/25/19 1100 11/25/19 1115  BP: 113/64 113/60  Pulse: 64 68  Resp: 19 18  Temp:    SpO2: 100% 93%    Last Pain:  Vitals:   11/25/19 1100  TempSrc:   PainSc: [place]

## 2019-11-25 NOTE — Progress Notes (Signed)
Pt stated "I feel like I have more bowel movement to come."  Assisted to br.  Pt had small amt liquid stool in specipan w small amt bloody drainage noted .  Cool wash cloth used to assist in cleaning rectal area. Clean 4x4's, abd pad and mesh pants applied.  Gait steady w minimal assist.. to phase 2, sitting on ice pk.

## 2019-11-25 NOTE — Discharge Instructions (Signed)
ANORECTAL SURGERY:  POST OPERATIVE INSTRUCTIONS  ######################################################################  EAT Start with a pureed / full liquid diet After 24 hours, gradually transition to a high fiber diet.    CONTROL PAIN Control pain so you can tolerate bowel movements,  walk, sleep, tolerate sneezing/coughing, and go up/down stairs.   HAVE A BOWEL MOVEMENT DAILY Keep your bowels regular to avoid problems.   Taking a fiber supplement every day to keep bowels soft.   Try a laxative to override constipation. Use an antidairrheal to slow down diarrhea.   Call if not better after 2 tries  WALK Walk an hour a day.  Control your pain to do that.   CALL IF YOU HAVE PROBLEMS/CONCERNS Call if you are still struggling despite following these instructions. Call if you have concerns not answered by these instructions  ######################################################################    1. Take your usually prescribed home medications unless otherwise directed.  2. DIET: Follow a light bland diet & liquids the first 24 hours after arrival home, such as soup, liquids, starches, etc.  Be sure to drink plenty of fluids.  Quickly advance to a usual solid diet within a few days.  Avoid fast food or heavy meals as your are more likely to get nauseated or have irregular bowels.  A low-fat, high-fiber diet for the rest of your life is ideal.  3. PAIN CONTROL: a. Pain is best controlled by a usual combination of three different methods TOGETHER: i. Ice/Heat ii. Over the counter pain medication iii. Prescription pain medication b. Expect swelling and discomfort in the anus/rectal area.  Warm water baths (30-60 minutes up to 6 times a day, especially after bowel meovements) will help. Use ice for the first few days to help decrease swelling and bruising, then switch to heat such as warm towels, sitz baths, warm baths, etc to help relax tight/sore spots and speed recovery.   Some people prefer to use ice alone, heat alone, alternating between ice & heat.  Experiment to what works for you.   c. It is helpful to take an over-the-counter pain medication continuously for the first few weeks.  Choose one of the following that works best for you: i. Naproxen (Aleve, etc)  Two 250m tabs twice a day ii. Ibuprofen (Advil, etc) Three 2072mtabs four times a day (every meal & bedtime) iii. Acetaminophen (Tylenol, etc) 500-65044mour times a day (every meal & bedtime) d. A  prescription for pain medication (such as oxycodone, hydrocodone, etc) should be given to you upon discharge.  Take your pain medication as prescribed.  i. If you are having problems/concerns with the prescription medicine (does not control pain, nausea, vomiting, rash, itching, etc), please call us [phone] see if we need to switch you to a different pain medicine that will work better for you and/or control your side effect better. ii. If you need a refill on your pain medication, please contact your pharmacy.  They will contact our office to request authorization. Prescriptions will not be filled after 5 pm or on week-ends.  If can take up to 48 hours for it to be filled & ready so avoid waiting until you are down to thel ast pill. e. A topical cream (Dibucaine) or a prescription for a cream (such as diltiazem 2% gel) may be given to you.  Many people find relief with topical creams.  Some people find it burns too much.  Experiment.  If it helps, use it.  If it burns, don't using  it.  Use a Sitz Bath 4-8 times a day for relief   [employer] A sitz bath is a warm water bath taken in the sitting position that covers only the hips and buttocks. It may be used for either healing or hygiene purposes. Sitz baths are also used to relieve pain, itching, or muscle spasms. The water may contain medicine. Moist heat will help you heal and relax.  HOME CARE INSTRUCTIONS  Take 3 to 4 sitz baths a day. 1. Fill the  bathtub half full with warm water. 2. Sit in the water and open the drain a little. 3. Turn on the warm water to keep the tub half full. Keep the water running constantly. 4. Soak in the water for 15 to 20 minutes. 5. After the sitz bath, pat the affected area dry first.   4. KEEP YOUR BOWELS REGULAR a. The goal is one soft bowel movement a day b. Avoid getting constipated.  Between the surgery and the pain medications, it is common to experience some constipation.  Increasing fluid intake and taking a fiber supplement (such as Metamucil, Citrucel, FiberCon, MiraLax, etc) 2-3 times a day regularly will usually help prevent this problem from occurring.  A mild laxative (prune juice, Milk of Magnesia, MiraLax, etc) should be taken according to package directions if there are no bowel movements after 48 hours. c. Watch out for diarrhea.  If you have many loose bowel movements, simplify your diet to bland foods & liquids for a few days.  Stop any stool softeners and decrease your fiber supplement.  Switching to mild anti-diarrheal medications (Kayopectate, Pepto Bismol) can help.  Can try an imodium/loperamide dose.  If this worsens or does not improve, please call us.  5. Wound Care  a. Remove your bandages with your first bowel movement, usually the day after surgery.  You may have packing if you had an abscess.  Let any packing or gauze fall come out.   b. Wear an absorbent pad or soft cotton balls in your underwear as needed to catch any drainage and help keep the area  c. Keep the area clean and dry.  Bathe / shower every day.  Keep the area clean by showering / bathing over the incision / wound.   It is okay to soak an open wound to help wash it.  Consider using a squeeze bottle filled with warm water to gently wash the anal area.  Wet wipes or showers / gentle washing after bowel movements is often less traumatic than regular toilet paper. d. Dennis Bast will often notice bleeding with bowel movements.   This should slow down by the end of the first week of surgery.  Sitting on an ice pack can help. e. Expect some drainage.  This should slow down by the end of the first week of surgery, but you will have occasional bleeding or drainage up to a few months after surgery.  Wear an absorbent pad or soft cotton gauze in your underwear until the drainage stops.  6. ACTIVITIES as tolerated:   a. You may resume regular (light) daily activities beginning the next day--such as daily self-care, walking, climbing stairs--gradually increasing activities as tolerated.  If you can walk 30 minutes without difficulty, it is safe to try more intense activity such as jogging, treadmill, bicycling, low-impact aerobics, swimming, etc. b. Save the most intensive and strenuous activity for last such as sit-ups, heavy lifting, contact sports, etc  Refrain from any heavy lifting or straining  until you are off narcotics for pain control.   c. DO NOT PUSH THROUGH PAIN.  Let pain be your guide: If it hurts to do something, don't do it.  Pain is your body warning you to avoid that activity for another week until the pain goes down. d. You may drive when you are no longer taking prescription pain medication, you can comfortably sit for long periods of time, and you can safely maneuver your car and apply brakes. e. Dennis Bast may have sexual intercourse when it is comfortable.  7. FOLLOW UP in our office a. Please call [facility] at [phone] to set up an appointment to see your surgeon in the office for a follow-up appointment approximately 2-3 weeks after your surgery. b. Make sure that you call for this appointment the day you arrive home to ensure a convenient appointment time.  8. IF YOU HAVE DISABILITY OR FAMILY LEAVE FORMS, BRING THEM TO THE OFFICE FOR PROCESSING.  DO NOT GIVE THEM TO YOUR DOCTOR.        WHEN TO CALL us [phone]: 1. Poor pain control 2. Reactions / problems with new medications (rash/itching, nausea,  etc)  3. Fever over 101.5 F (38.5 C) 4. Inability to urinate 5. Nausea and/or vomiting 6. Worsening swelling or bruising 7. Continued bleeding from incision. 8. Increased pain, redness, or drainage from the incision  The clinic staff is available to answer your questions during regular business hours (8:30am-5pm).  Please don't hesitate to call and ask to speak to one of our nurses for clinical concerns.   A surgeon from [facility] is always on call at the hospitals   If you have a medical emergency, go to the nearest emergency room or call 911.    [facility], [address], [place], [place], [place]  [address] ? MAIN: [phone] ? TOLL FREE: [phone] ? FAX [phone] [email or web address]     HEMORRHOIDS  The rectum is the last foot of your colon, and it naturally stretches to hold stool.  Hemorrhoidal piles are natural clusters of blood vessels that help the rectum and anal canal stretch to hold stool and allow bowel movements to eliminate feces.   Hemorrhoids are abnormally swollen blood vessels in the rectum.  Too much pressure in the rectum causes hemorrhoids by forcing blood to stretch and bulge the walls of the veins, sometimes even rupturing them.  Hemorrhoids can become like varicose veins you might see on a person's legs.  Most people will develop a flare of hemorrhoids in their lifetime.  When bulging hemorrhoidal veins are irritated, they can swell, burn, itch, cause pain, and bleed.  Most flares will calm down gradually own within a few weeks.  However, once hemorrhoids are created, they are difficult to get rid of completely and tend to flare more easily than the first flare.   Fortunately, good habits and simple medical treatment usually control hemorrhoids well, and surgery is needed only in severe cases. Types of Hemorrhoids:  Internal hemorrhoids usually don't initially hurt or itch; they are deep inside the  rectum and usually have no sensation. If they begin to push out (prolapse), pain and burning can occur.  However, internal hemorrhoids can bleed.  Anal bleeding should not be ignored since bleeding could come from a dangerous source like colorectal cancer, so persistent rectal bleeding should be investigated by a doctor, sometimes with a colonoscopy.  External hemorrhoids cause most of the symptoms - pain, burning,  and itching. Nonirritated hemorrhoids can look like small skin tags coming out of the anus.   Thrombosed hemorrhoids can form when a hemorrhoid blood vessel bursts and causes the hemorrhoid to suddenly swell.  A purple blood clot can form in it and become an excruciatingly painful lump at the anus. Because of these unpleasant symptoms, immediate incision and drainage by a surgeon at an office visit can provide much relief of the pain.    PREVENTION Avoiding the most frequent causes listed below will prevent most cases of hemorrhoids: Constipation Hard stools Diarrhea  Constant sitting  Straining with bowel movements Sitting on the toilet for a long time  Severe coughing  episodes Pregnancy / Childbirth  Heavy Lifting  Sometimes avoiding the above triggers is difficult:  How can you avoid sitting all day if you have a seated job? Also, we try to avoid coughing and diarrhea, but sometimes it's beyond your control.  Still, there are some practical hints to help: Keep the anal and genital area clean.  Moistened tissues such as flushable wet wipes are less irritating than toilet paper.  Using irrigating showers or bottle irrigation washing gently cleans this sensitive area.   Avoid dry toilet paper when cleaning after bowel movements.  Marland Kitchen Keep the anal and genital area dry.  Lightly pat the rectal area dry.  Avoid rubbing.  Talcum or baby powders can help GET YOUR STOOLS SOFT.   This is the most important way to prevent irritated hemorrhoids.  Hard stools are like sandpaper to the anorectal  canal and will cause more problems.  The goal: ONE SOFT BOWEL MOVEMENT A DAY!  BMs from every other day to 3 times a day is a tolerable range Treat coughing, diarrhea and constipation early since irritated hemorrhoids may soon follow.  If your main job activity is seated, always stand or walk during your breaks. Make it a point to stand and walk at least 5 minutes every hour and try to shift frequently in your chair to avoid direct rectal pressure.  Always exhale as you strain or lift. Don't hold your breath.  Do not delay or try to prevent a bowel movement when the urge is present. Exercise regularly (walking or jogging 60 minutes a day) to stimulate the bowels to move. No reading or other activity while on the toilet. If bowel movements take longer than 5 minutes, you are too constipated. AVOID CONSTIPATION Drink plenty of liquids (1 1/2 to 2 quarts of water and other fluids a day unless fluid restricted for another medical condition). Liquids that contain caffeine (coffee a, tea, soft drinks) can be dehydrating and should be avoided until constipation is controlled. Consider minimizing milk, as dairy products may be constipating. Eat plenty of fiber (30g a day ideal, more if needed).  Fiber is the undigested part of plant food that passes into the colon, acting as "natures broom" to encourage bowel motility and movement.  Fiber can absorb and hold large amounts of water. This results in a larger, bulkier stool, which is soft and easier to pass.  Eating foods high in fiber - 12 servings - such as  Vegetables: Root (potatoes, carrots, turnips), Leafy green (lettuce, salad greens, celery, spinach), High residue (cabbage, broccoli, etc.) Fruit: Fresh, Dried (prunes, apricots, cherries), Stewed (applesauce)  Whole grain breads, pasta, whole wheat Bran cereals, muffins, etc. Consider adding supplemental bulking fiber which retains large volumes of water: Psyllium ground seeds (native plant from [place])--available as Metamucil, Konsyl, Effersyllium, Per  Diem Fiber, or the less expensive generic forms.  Citrucel  (methylcellulose wood fiber) . FiberCon (Polycarbophil) Polyethylene Glycol - and "artificial" fiber commonly called Miralax or Glycolax.  It is helpful for people with gassy or bloated feelings with regular fiber Flax Seed - a less gassy natural fiber  Laxatives can be useful for a short period if constipation is severe Osmotics (Milk of Magnesia, Fleets Phospho-Soda, Magnesium Citrate)  Stimulants (Senokot,   Castor Oil,  Dulcolax, Ex-Lax)    Laxatives are not a good long-term solution as it can stress the bowels and cause too much mineral loss and dehydration.   Avoid taking laxatives for more than 7 days in a row.  AVOID DIARRHEA Switch to liquids and simpler foods for a few days to avoid stressing your intestines further. Avoid dairy products (especially milk & ice cream) for a short time.  The intestines often can lose the ability to digest lactose when stressed. Avoid foods that cause gassiness or bloating.  Typical foods include beans and other legumes, cabbage, broccoli, and dairy foods.  Every person has some sensitivity to other foods, so listen to your body and avoid those foods that trigger problems for you. Adding fiber (Citrucel, Metamucil, FiberCon, Flax seed, Miralax) gradually can help thicken stools by absorbing excess fluid and retrain the intestines to act more normally.  Slowly increase the dose over a few weeks.  Too much fiber too soon can backfire and cause cramping & bloating. Probiotics (such as active yogurt, Align, etc) may help repopulate the intestines and colon with normal bacteria and calm down a sensitive digestive tract.  Most studies show it to be of mild help, though, and such products can be costly. Medicines: Bismuth subsalicylate (ex. Kayopectate, Pepto Bismol) every 30 minutes for up to 6 doses can help control diarrhea.  Avoid if  pregnant. Loperamide (Immodium) can slow down diarrhea.  Start with two tablets (4mg  total) first and then try one tablet every 6 hours.  Avoid if you are having fevers or severe pain.  If you are not better or start feeling worse, stop all medicines and call your doctor for advice Call your doctor if you are getting worse or not better.  Sometimes further testing (cultures, endoscopy, X-ray studies, bloodwork, etc) may be needed to help diagnose and treat the cause of the diarrhea.  TROUBLESHOOTING IRREGULAR BOWELS 1) Avoid extremes of bowel movements (no bad constipation/diarrhea) 2) Miralax 17gm mixed in 8oz. water or juice-daily. May use BID as needed.  3) Gas-x,Phazyme, etc. as needed for gas & bloating.  4) Soft,bland diet. No spicy,greasy,fried foods.  5) Prilosec over-the-counter as needed  6) May hold gluten/wheat products from diet to see if symptoms improve.  7)  May try probiotics (Align, Activa, etc) to help calm the bowels down 7) If symptoms become worse call back immediately.   TREATMENT OF HEMORRHOID FLARE If these preventive measures fail, you must take action right away! Hemorrhoids are one condition that can be mild in the morning and become intolerable by nightfall. Most hemorrhoidal flares take several weeks to calm down.  These suggestions can help: Warm soaks.  This helps more than any topical medication.  Use up to 8 times a day.  Usually sitz baths or sitting in a warm bathtub helps.  Sitting on moist warm towels are helpful.  Switching to ice packs/cool compresses can be helpful  Use a Sitz Bath 4-8 times a day for relief A sitz bath is a warm water bath  taken in the sitting position that covers only the hips and buttocks. It may be used for either healing or hygiene purposes. Sitz baths are also used to relieve pain, itching, or muscle spasms. The water may contain medicine. Moist heat will help you heal and relax.  HOME CARE INSTRUCTIONS  Take 3 to 4 sitz baths a  day. 1. Fill the bathtub half full with warm water. 2. Sit in the water and open the drain a little. 3. Turn on the warm water to keep the tub half full. Keep the water running constantly. 4. Soak in the water for 15 to 20 minutes. 5. After the sitz bath, pat the affected area dry first. SEEK MEDICAL CARE IF:  You get worse instead of better. Stop the sitz baths if you get worse.  Normalize your bowels.  Extremes of diarrhea or constipation will make hemorrhoids worse.  One soft bowel movement a day is the goal.  Fiber can help get your bowels regular Wet wipes instead of toilet paper Pain control with a NSAID such as ibuprofen (Advil) or naproxen (Aleve) or acetaminophen (Tylenol) around the clock.  Narcotics are constipating and should be minimized if possible Topical creams contain steroids (bydrocortisone) or local anesthetic (xylocaine) can help make pain and itching more tolerable.   EVALUATION If hemorrhoids are still causing problems, you could benefit by an evaluation by a surgeon.  The surgeon will obtain a history and examine you.  If hemorrhoids are diagnosed, some therapies can be offered in the office, usually with an anoscope into the less sensitive area of the rectum: -injection of hemorrhoids (sclerotherapy) can scar the blood vessels of the swollen/enlarged hemorrhoids to help shrink them down to a more normal size -rubber banding of the enlarged hemorrhoids to help shrink them down to a more normal size -drainage of the blood clot causing a thrombosed hemorrhoid,  to relieve the severe pain   While 90% of the time such problems from hemorrhoids can be managed without preceding to surgery, sometimes the hemorrhoids require a operation to control the problem (uncontrolled bleeding, prolapse, pain, etc.).   This involves being placed under general anesthesia where the surgeon can confirm the diagnosis and remove, suture, or staple the hemorrhoid(s).  Your surgeon can help you treat  the problem appropriately.     Post Anesthesia Home Care Instructions  Activity: Get plenty of rest for the remainder of the day. A responsible adult should stay with you for 24 hours following the procedure.  For the next 24 hours, DO NOT: -Drive a car -Paediatric nurse -Drink alcoholic beverages -Take any medication unless instructed by your physician -Make any legal decisions or sign important papers.  Meals: Start with liquid foods such as gelatin or soup. Progress to regular foods as tolerated. Avoid greasy, spicy, heavy foods. If nausea and/or vomiting occur, drink only clear liquids until the nausea and/or vomiting subsides. Call your physician if vomiting continues.  Special Instructions/Symptoms: Your throat may feel dry or sore from the anesthesia or the breathing tube placed in your throat during surgery. If this causes discomfort, gargle with warm salt water. The discomfort should disappear within 24 hours.  If you had a scopolamine patch placed behind your ear for the management of post- operative nausea and/or vomiting:  1. The medication in the patch is effective for 72 hours, after which it should be removed.  Wrap patch in a tissue and discard in the trash. Wash hands thoroughly with soap and water. 2.  You may remove the patch earlier than 72 hours if you experience unpleasant side effects which may include dry mouth, dizziness or visual disturbances. 3. Avoid touching the patch. Wash your hands with soap and water after contact with the patch.   Information for Discharge Teaching: EXPAREL (bupivacaine liposome injectable suspension)   Your surgeon gave you EXPAREL(bupivacaine) in your surgical incision to help control your pain after surgery.   EXPAREL is a local anesthetic that provides pain relief by numbing the tissue around the surgical site.  EXPAREL is designed to release pain medication over time and can control pain for up to 72 hours.  Depending on how  you respond to EXPAREL, you may require less pain medication during your recovery.  Possible side effects:  Temporary loss of sensation or ability to move in the area where bupivacaine was injected.  Nausea, vomiting, constipation  Rarely, numbness and tingling in your mouth or lips, lightheadedness, or anxiety may occur.  Call your doctor right away if you think you may be experiencing any of these sensations, or if you have other questions regarding possible side effects.  Follow all other discharge instructions given to you by your surgeon or nurse. Eat a healthy diet and drink plenty of water or other fluids.  If you return to the hospital for any reason within 96 hours following the administration of EXPAREL, please inform your health care providers.

## 2019-11-25 NOTE — Interval H&P Note (Signed)
History and Physical Interval Note:  11/25/2019 8:28 AM  Valerie Hull  has presented today for surgery, with the diagnosis of HEMORRHOIDS PROLAPSING GRADE 3 WITH BLEEDING AND WITH PAIN.  The various methods of treatment have been discussed with the patient and family. After consideration of risks, benefits and other options for treatment, the patient has consented to  Procedure(s): ANORECTAL EXAM UNDER ANESTHESIA WITH HEMORRHOIDECTOMY, HEMORRHOIDAL LIGATION/PEXY (N/A) as a surgical intervention.  The patient's history has been reviewed, patient examined, no change in status, stable for surgery.  I have reviewed the patient's chart and labs.  Questions were answered to the patient's satisfaction.    I have re-reviewed the the patient's records, history, medications, and allergies.  I have re-examined the patient.  I again discussed intraoperative plans and goals of post-operative recovery.  The patient agrees to proceed.  Valerie Hull  [birth year] RO:[id number]  Patient Care Team: Leighton Ruff, MD as PCP - General (Family Medicine) Michael Boston, MD as Consulting Physician (General Surgery) Richmond Campbell, MD as Consulting Physician (Gastroenterology)  There are no problems to display for this patient.   Past Medical History:  Diagnosis Date   Acid reflux    Arthritis    DJD (degenerative joint disease)    Hemorrhoids    History of hiatal hernia    IBS (irritable bowel syndrome)    Osteoporosis    PONV (postoperative nausea and vomiting)    Scoliosis     Past Surgical History:  Procedure Laterality Date   cataracts Bilateral    CHOLECYSTECTOMY     laparscopic   DILATION AND CURETTAGE OF UTERUS     x2   eye lid surgery Bilateral    EYE SURGERY     age 79 both eyes muscle, sx at [facility] for seeing double 1 eye   HIP SURGERY Bilateral    left 1988, right done 1988   NASAL ENDOSCOPY     esophagus stretched   NASAL SEPTUM SURGERY     thumb surgery Left    joint replaced    WRIST SURGERY Left     Social History   Socioeconomic History   Marital status: Single    Spouse name: Not on file   Number of children: Not on file   Years of education: Not on file   Highest education level: Not on file  Occupational History   Not on file  Tobacco Use   Smoking status: Never Smoker   Smokeless tobacco: Never Used  Substance and Sexual Activity   Alcohol use: No   Drug use: No   Sexual activity: Not on file  Other Topics Concern   Not on file  Social History Narrative   Not on file   Social Determinants of Health   Financial Resource Strain:    Difficulty of Paying Living Expenses: Not on file  Food Insecurity:    Worried About Running Out of Food in the Last Year: Not on file   Ran Out of Food in the Last Year: Not on file  Transportation Needs:    Lack of Transportation (Medical): Not on file   Lack of Transportation (Non-Medical): Not on file  Physical Activity:    Days of Exercise per Week: Not on file   Minutes of Exercise per Session: Not on file  Stress:    Feeling of Stress : Not on file  Social Connections:    Frequency of Communication with Friends and Family: Not on file   Frequency of  Social Gatherings with Friends and Family: Not on file   Attends Religious Services: Not on file   Active Member of Clubs or Organizations: Not on file   Attends Archivist Meetings: Not on file   Marital Status: Not on file  Intimate Partner Violence:    Fear of Current or Ex-Partner: Not on file   Emotionally Abused: Not on file   Physically Abused: Not on file   Sexually Abused: Not on file    Family History  Problem Relation Age of Onset   Rheum arthritis Mother    Cancer Mother    Heart failure Father     Medications Prior to Admission  Medication Sig Dispense Refill Last Dose   Calcium Citrate-Vitamin D (CALCIUM + D PO) Take 1 tablet by mouth 4 (four) times daily.    11/24/2019 at Unknown time   diphenhydramine-acetaminophen  (TYLENOL PM) 25-500 MG TABS tablet Take 1 tablet by mouth at bedtime as needed (pain/sleep).   11/24/2019 at Unknown time   gabapentin (NEURONTIN) 300 MG capsule Take 300 mg by mouth 2 (two) times daily.   11/25/2019 at 0735   Melatonin 5 MG CAPS Take by mouth at bedtime as needed.   11/24/2019 at Unknown time   Multiple Vitamins-Minerals (CENTRUM SILVER ULTRA WOMENS PO) Take by mouth daily.   Past Week at Unknown time   omeprazole (PRILOSEC) 40 MG capsule Take 40 mg by mouth daily.   11/25/2019 at Unknown time   Turmeric (QC TUMERIC COMPLEX PO) Take by mouth. 1000 mg daily   11/24/2019 at Unknown time   UNABLE TO FIND Vitamin d 3 50 mg tid   More than a month at Unknown time    Current Facility-Administered Medications  Medication Dose Route Frequency Provider Last Rate Last Admin   acetaminophen (TYLENOL) tablet 1,000 mg  1,000 mg Oral Once Brennan Bailey, MD       bupivacaine liposome (EXPAREL) 1.3 % injection 266 mg  20 mL Infiltration Once Michael Boston, MD       Chlorhexidine Gluconate Cloth 2 % PADS 6 each  6 each Topical Once Michael Boston, MD       And   Chlorhexidine Gluconate Cloth 2 % PADS 6 each  6 each Topical Once Michael Boston, MD       clindamycin (CLEOCIN) IVPB 900 mg  900 mg Intravenous On Call to OR Michael Boston, MD       And   gentamicin (GARAMYCIN) 270 mg in dextrose 5 % 100 mL IVPB  5 mg/kg Intravenous On Call to OR Michael Boston, MD       lactated ringers infusion   Intravenous Continuous Lyn Hollingshead, MD 50 mL/hr at 11/25/19 0805 New Bag at 11/25/19 0805     Allergies  Allergen Reactions   Ampicillin Hives and Nausea And Vomiting    Has patient had a PCN reaction causing immediate rash, facial/tongue/throat swelling, SOB or lightheadedness with hypotension: No Has patient had a PCN reaction causing severe rash involving mucus membranes or skin necrosis: No Has patient had a PCN reaction that required hospitalization No Has patient had a PCN reaction occurring  within the last 10 years: No If all of the above answers are "NO", then may proceed with Cephalosporin use.   Codeine Nausea And Vomiting   Crestor [Rosuvastatin] Other (See Comments)    Pain in joints   Lipitor [Atorvastatin]     elevated liver enzymes   Nitroglycerin Other (See Comments)  Blood pressure dropped with nitroglycerin cream   Sulfa Antibiotics Hives   Adhesive [Tape] Rash    BP 116/89   Pulse 97   Temp (!) 97.5 F (36.4 C) (Oral)   Resp 14   Ht 4\' 10"  (1.473 m)   Wt 52.6 kg   SpO2 99%   BMI 24.24 kg/m   Labs: Results for orders placed or performed during the hospital encounter of 11/25/19 (from the past 48 hour(s))  I-STAT, chem 8     Status: None   Collection Time: 11/25/19  8:05 AM  Result Value Ref Range   Sodium 142 135 - 145 mmol/L   Potassium 4.3 3.5 - 5.1 mmol/L   Chloride 102 98 - 111 mmol/L   BUN 15 8 - 23 mg/dL   Creatinine, Ser 0.80 0.44 - 1.00 mg/dL   Glucose, Bld 81 70 - 99 mg/dL   Calcium, Ion 1.22 1.15 - 1.40 mmol/L   TCO2 32 22 - 32 mmol/L   Hemoglobin 14.6 12.0 - 15.0 g/dL   HCT 43.0 36.0 - 46.0 %    Imaging / Studies: MR LUMBAR SPINE WO CONTRAST  Result Date: 11/14/2019 CLINICAL DATA:  Low back pain EXAM: MRI LUMBAR SPINE WITHOUT CONTRAST TECHNIQUE: Multiplanar, multisequence MR imaging of the lumbar spine was performed. No intravenous contrast was administered. COMPARISON:  2010 FINDINGS: Segmentation: Standard. Alignment: Levocurvature of the thoracolumbar junction and dextrocurvature the lumbar spine. There is mild retrolisthesis at L1-L2. Vertebrae: Chronic compression deformity of L2 with mild superior endplate retropulsion. Multilevel degenerative endplate irregularity. Trace degenerate endplate marrow edema at L3-L4. No suspicious osseous lesion. Conus medullaris and cauda equina: Conus extends to the L3-L4 level, which is abnormally low. Suspected fatty filum terminale without thickening. Paraspinal and other soft tissues:  Unremarkable. Disc levels: L1-L2: Disc bulge with endplate osteophytic ridging and facet arthropathy with ligamentum flavum infolding. No significant canal or left foraminal stenosis. Mild right foraminal stenosis. L2-L3: Facet arthropathy with ligamentum flavum infolding. No canal or foraminal stenosis. L3-L4: Disc bulge with endplate osteophytic ridging eccentric to the left and facet arthropathy with ligamentum flavum infolding. No canal stenosis. Slight effacement of the left lateral recess. No right foraminal stenosis. Minor left foraminal stenosis. L4-L5: Disc bulge eccentric to the left. Facet arthropathy with ligamentum flavum infolding. No canal or foraminal stenosis. L5-S1: Right greater than left facet arthropathy. No canal or foraminal stenosis. IMPRESSION: Multilevel degenerative changes as detailed above. No substantial change since 2010 apart from increased facet arthropathy. 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 Electronically Signed   By: Macy Mis M.D.   On: 11/14/2019 09:00     .Adin Hector, M.D., F.A.C.S. Gastrointestinal and Minimally Invasive Surgery [facility] [address], [place], [place] [address] [phone] Main / Paging  11/25/2019 8:29 AM    Adin Hector

## 2019-11-25 NOTE — Transfer of Care (Signed)
Immediate Anesthesia Transfer of Care Note  Patient: RYELEIGH KIMES  Procedure(s) Performed: ANORECTAL EXAM UNDER ANESTHESIA WITH HEMORRHOIDECTOMY, HEMORRHOIDAL LIGATION/PEXY (N/A Rectum)  Patient Location: PACU  Anesthesia Type:General  Level of Consciousness: awake, alert  and oriented  Airway & Oxygen Therapy: Patient Spontanous Breathing and Patient connected to nasal cannula oxygen  Post-op Assessment: Report given to RN and Post -op Vital signs reviewed and stable  Post vital signs: Reviewed and stable  Last Vitals:  Vitals Value Taken Time  BP 91/60 11/25/19 1026  Temp 36.6 C 11/25/19 1021  Pulse 69 11/25/19 1028  Resp 17 11/25/19 1028  SpO2 100 % 11/25/19 1028  Vitals shown include unvalidated device data.  Last Pain:  Vitals:   11/25/19 0735  TempSrc: Oral  PainSc: 0-No pain         Complications: No apparent anesthesia complications

## 2019-11-25 NOTE — Progress Notes (Signed)
I discussed operative findings, updated the patient's status, discussed probable steps to recovery, and gave postoperative recommendations to the her sister, Hoyle Sauer.  Recommendations were made.  Questions were answered.  She expressed understanding & appreciation.  Adin Hector, MD, FACS, MASCRS Gastrointestinal and Minimally Invasive Surgery  [facility] [address] [place], [place] [address] [phone] Main / Paging [phone] Fax

## 2019-11-25 NOTE — Progress Notes (Signed)
Attempted to ambulate patient x 2 to BR to void, patient was unsuccessful. Patient c/o abdominal pressure/discomfort. VSS. Bladder scan performed, 197 ml urine retention noted. Dr. Johney Maine' office notified, office RN Abigail Butts gave order to cath and leave indwelling foley catheter in place until Monday 11/28/19 and have patient return to office for D/C of foley. Advised patient to D/C home with foley. 16 fr foley catheter inserted using sterile technique per MD order.

## 2019-11-25 NOTE — Anesthesia Procedure Notes (Signed)
Procedure Name: Intubation Date/Time: 11/25/2019 8:58 AM Performed by: Bufford Spikes, CRNA Pre-anesthesia Checklist: Patient identified, Emergency Drugs available, Suction available and Patient being monitored Patient Re-evaluated:Patient Re-evaluated prior to induction Oxygen Delivery Method: Circle system utilized Preoxygenation: Pre-oxygenation with 100% oxygen Induction Type: IV induction Ventilation: Mask ventilation without difficulty Laryngoscope Size: Miller and 2 Grade View: Grade I Tube type: Oral Tube size: 7.0 mm Number of attempts: 1 Airway Equipment and Method: Stylet and Oral airway Placement Confirmation: ETT inserted through vocal cords under direct vision,  positive ETCO2 and breath sounds checked- equal and bilateral Secured at: 20 cm Tube secured with: Tape Dental Injury: Teeth and Oropharynx as per pre-operative assessment

## 2019-11-25 NOTE — H&P (Signed)
Valerie Hull   DOB: 1941/06/23   Patient Care Team: Leighton Ruff, MD as PCP - General (Family Medicine)  Marland Kitchen  `  Patient sent for surgical consultation at the request of Dr Earlean Shawl  Chief Complaint: Persistent bleeding hemorrhoid status post prior banding.  `  `  The patient is a woman that is struggle with rectal bleeding for some time. She underwent colonoscopy by Dr. Earlean Shawl. One tubular adenoma removed. Enlarged hemorrhoid seen. This was in February 2020. She is had a few rounds of the sigmoidoscopy [facility] banding by Dr. Earlean Shawl with persistent bleeding. Surgical consultation requested to have more definitive intervention. She does not smoke. No cardiac or stroke issues. Can walk 1/2-hour without difficulty. Rather active and independent. She moves her bowels about every 2 days with the help of one Benefiber. Occasional hard stools but no major straining. They think she has undergone banding over 6 times. She recalls having hemorrhoid surgery in the 1970s. No other anorectal surgery.  No personal nor family history of GI/colon cancer, inflammatory bowel disease, irritable bowel syndrome, allergy such as Celiac Sprue, dietary/dairy problems, colitis, ulcers nor gastritis. No recent sick contacts/gastroenteritis. No travel outside the country. No changes in diet. No dysphagia to solids or liquids. No significant heartburn or reflux. No hematochezia, hematemesis, coffee ground emesis. No evidence of prior gastric/peptic ulceration.   No new events.  Ready for surgery  (Review of systems as stated in this history (HPI) or in the review of systems. Otherwise all other 12 point ROS are negative)  `  `  `  Past Surgical History (Tanisha A. Owens Shark, [place]; 09/27/2019 9:11 AM)  Cataract Surgery Bilateral.  Gallbladder Surgery - Laparoscopic  Hemorrhoidectomy  Diagnostic Studies History (Tanisha A. Owens Shark, [place]; 09/27/2019 9:11 AM)  Colonoscopy within last year  Mammogram within last year  Pap  Smear 1-5 years ago  Allergies (Tanisha A. Owens Shark, RMA; 09/27/2019 9:13 AM)  Codeine Phosphate *ANALGESICS - OPIOID*  Sulfa Antibiotics  Ampicillin *PENICILLINS*  Septra *ANTI-INFECTIVE AGENTS - MISC.*  Nitroglycerin *ANTIANGINAL AGENTS*  Lipitor *ANTIHYPERLIPIDEMICS*  Crestor *ANTIHYPERLIPIDEMICS*  Adhesive Tape  Allergies Reconciled  Medication History (Tanisha A. Owens Shark, [place]; 09/27/2019 9:14 AM)  Omeprazole (40MG  Capsule DR, Oral) Active.  Gabapentin (300MG  Capsule, Oral) Active.  Alendronate Sodium (70MG  Tablet, Oral) Active.  Medications Reconciled  Social History (Tanisha A. Owens Shark, RMA; 09/27/2019 9:11 AM)  Caffeine use Carbonated beverages.  No alcohol use  No drug use  Tobacco use Never smoker.  Family History (Tanisha A. Owens Shark, [place]; 09/27/2019 9:11 AM)  Alcohol Abuse Father.  Arthritis Mother.  Heart Disease Father, Sister.  Pregnancy / Birth History (Tanisha A. Owens Shark, [place]; 09/27/2019 9:11 AM)  Age at menarche 67 years.  Age of menopause <46  Gravida 0  Para 0  Other Problems (Tanisha A. Owens Shark, [place]; 09/27/2019 9:11 AM)  Back Pain  Diverticulosis  Gastroesophageal Reflux Disease  Hemorrhoids  Hypercholesterolemia  Review of Systems (Tanisha A. Brown RMA; 09/27/2019 9:11 AM)  General Present- Weight Gain. Not Present- Appetite Loss, Chills, Fatigue, Fever, Night Sweats and Weight Loss.  Skin Present- Dryness. Not Present- Change in Wart/Mole, Hives, Jaundice, New Lesions, Non-Healing Wounds, Rash and Ulcer.  HEENT Present- Sinus Pain and Wears glasses/contact lenses. Not Present- Earache, Hearing Loss, Hoarseness, Nose Bleed, Oral Ulcers, Ringing in the Ears, Seasonal Allergies, Sore Throat, Visual Disturbances and Yellow Eyes.  Respiratory Present- Snoring. Not Present- Bloody sputum, Chronic Cough, Difficulty Breathing and Wheezing.  Cardiovascular Not Present- Chest Pain, Difficulty Breathing Lying Down,  Leg Cramps, Palpitations, Rapid Heart Rate, Shortness of Breath  and Swelling of Extremities.  Gastrointestinal Present- Constipation and Indigestion. Not Present- Abdominal Pain, Bloating, Bloody Stool, Change in Bowel Habits, Chronic diarrhea, Difficulty Swallowing, Excessive gas, Gets full quickly at meals, Hemorrhoids, Nausea, Rectal Pain and Vomiting.  Female Genitourinary Not Present- Frequency, Nocturia, Painful Urination, Pelvic Pain and Urgency.  Musculoskeletal Present- Back Pain. Not Present- Joint Pain, Joint Stiffness, Muscle Pain, Muscle Weakness and Swelling of Extremities.  Neurological Not Present- Decreased Memory, Fainting, Headaches, Numbness, Seizures, Tingling, Tremor, Trouble walking and Weakness.  Psychiatric Not Present- Anxiety, Bipolar, Change in Sleep Pattern, Depression, Fearful and Frequent crying.  Endocrine Not Present- Cold Intolerance, Excessive Hunger, Hair Changes, Heat Intolerance, Hot flashes and New Diabetes.  Hematology Not Present- Blood Thinners, Easy Bruising, Excessive bleeding, Gland problems, HIV and Persistent Infections.    Vitals (Tanisha A. Brown RMA; 09/27/2019 9:12 AM)  09/27/2019 9:11 AM  Weight: 117.6 lb Height: 58.5 in  Body Surface Area: 1.46 m Body Mass Index: 24.16 kg/m  Temp.: 97.3 F Pulse: 104 (Regular)  BP: 138/86 (Sitting, Left Arm, Standard)    11/25/2019 BP 116/89   Pulse 97   Temp (!) 97.5 F (36.4 C) (Oral)   Resp 14   Ht 4\' 10"  (1.473 m)   Wt 52.6 kg   SpO2 99%   BMI 24.24 kg/m    Physical Exam Adin Hector MD; 09/27/2019 9:50 AM)  General  Mental Status - Alert.  General Appearance - Not in acute distress, Not Sickly.  Orientation - Oriented X3.  Hydration - Well hydrated.  Voice - Normal.  Integumentary  Global Assessment  Upon inspection and palpation of skin surfaces of the - Axillae: non-tender, no inflammation or ulceration, no drainage. and Distribution of scalp and body hair is normal.  General Characteristics  Temperature - normal warmth is noted.  Head and  Neck  Head - normocephalic, atraumatic with no lesions or palpable masses.  Face  Global Assessment - atraumatic, no absence of expression.  Neck  Global Assessment - no abnormal movements, no bruit auscultated on the right, no bruit auscultated on the left, no decreased range of motion, non-tender.  Trachea - midline.  Thyroid  Gland Characteristics - non-tender.  Eye  Eyeball - Left - Extraocular movements intact, No Nystagmus - Left.  Eyeball - Right - Extraocular movements intact, No Nystagmus - Right.  Cornea - Left - No Hazy - Left.  Cornea - Right - No Hazy - Right.  Sclera/Conjunctiva - Left - No scleral icterus, No Discharge - Left.  Sclera/Conjunctiva - Right - No scleral icterus, No Discharge - Right.  Pupil - Left - Direct reaction to light normal.  Pupil - Right - Direct reaction to light normal.  Note: Inward gaze of right eye. Wears glasses. Vision corrected  ENMT  Ears  Pinna - Left - no drainage observed, no generalized tenderness observed. Pinna - Right - no drainage observed, no generalized tenderness observed.  Nose and Sinuses  External Inspection of the Nose - no destructive lesion observed. Inspection of the nares - Left - quiet respiration. Inspection of the nares - Right - quiet respiration.  Mouth and Throat  Lips - Upper Lip - no fissures observed, no pallor noted. Lower Lip - no fissures observed, no pallor noted. Nasopharynx - no discharge present. Oral Cavity/Oropharynx - Tongue - no dryness observed. Oral Mucosa - no cyanosis observed. Hypopharynx - no evidence of airway distress observed.  Note: Hard  of hearing  Chest and Lung Exam  Inspection  Movements - Normal and Symmetrical. Accessory muscles - No use of accessory muscles in breathing.  Palpation  Palpation of the chest reveals - Non-tender.  Auscultation  Breath sounds - Normal and Clear.  Cardiovascular  Auscultation  Rhythm - Regular. Murmurs & Other Heart Sounds - Auscultation of the heart  reveals - No Murmurs and No Systolic Clicks.  Abdomen  Inspection  Inspection of the abdomen reveals - No Visible peristalsis and No Abnormal pulsations. Umbilicus - No Bleeding, No Urine drainage.  Palpation/Percussion  Palpation and Percussion of the abdomen reveal - Soft, Non Tender, No Rebound tenderness, No Rigidity (guarding) and No Cutaneous hyperesthesia.  Note: Abdomen soft. Not severely distended. No distasis recti. No umbilical or other anterior abdominal wall hernias  Female Genitourinary  Sexual Maturity  Tanner 5 - Adult hair pattern.  Note: No vaginal bleeding nor discharge  Rectal  Note: Please refer to anoscopy section. External hemorrhoids with at least grade 2 probable 3 right posterior internal hemorrhoid as well.  Peripheral Vascular  Upper Extremity  Inspection - Left - No Cyanotic nailbeds - Left, Not Ischemic. Inspection - Right - No Cyanotic nailbeds - Right, Not Ischemic.  Neurologic  Neurologic evaluation reveals - normal attention span and ability to concentrate, able to name objects and repeat phrases. Appropriate fund of knowledge , normal sensation and normal coordination.  Mental Status  Affect - not angry, not paranoid.  Cranial Nerves - Normal Bilaterally.  Gait - Normal.  Neuropsychiatric  Mental status exam performed with findings of - able to articulate well with normal speech/language, rate, volume and coherence, thought content normal with ability to perform basic computations and apply abstract reasoning and no evidence of hallucinations, delusions, obsessions or homicidal/suicidal ideation.  Musculoskeletal  Global Assessment  Spine, Ribs and Pelvis - no instability, subluxation or laxity. Right Upper Extremity - no instability, subluxation or laxity.  Lymphatic  Head & Neck  General Head & Neck Lymphatics: Bilateral - Description - No Localized lymphadenopathy.  Axillary  General Axillary Region: Bilateral - Description - No Localized  lymphadenopathy.  Femoral & Inguinal  Generalized Femoral & Inguinal Lymphatics: Left - Description - No Localized lymphadenopathy. Right - Description - No Localized lymphadenopathy.  Results Adin Hector MD; 09/27/2019 12:29 PM)  Procedures  Name  Value  Date  Hemorrhoids  Procedure  Anal exam: External Hemorrhoid  Internal exam:  Internal Hemorroids ( non-bleeding)  Internal Hemorrhoids (bleeding)  Other: Enlarged right posterior internal hemorrhoid with some partial prolapse. Friable. Scarring left lateral hemorrhoidal aspect more persistent grade 1/2 hemorrhoid. Right anterior was some scarring and irritation as well. Probably grade 2. Left lateral external hemorrhoidal tags. Right anterior external hemorrhoidal tag............Marland KitchenPerianal skin clean with good hygiene. No pruritis ani. No pilonidal disease. No fissure. No abscess/fistula. Normal sphincter tone. ....Marland KitchenMarland KitchenNo condyloma warts. Tolerates digital and anoscopic rectal exam. No rectal masses. Hemorrhoidal piles normal. Redundant rectum with internal rectal prolapse proximal to middle. However not going out the actual sphincter complex. Mild rectocele....Marland KitchenMarland KitchenExam done with assistance of female Medical Assistant in the room.  Performed: 09/27/2019 9:45 AM   Assessment & Plan  INTERNAL BLEEDING HEMORRHOIDS (K64.8)  Impression: Persistent rectal bleeding from hemorrhoids. No other proximal source. Has had repeated banding's. She thinks at least 5 times. At least a few this year with persistent bleeding.  While I could potentially band the right posterior one, I'm skeptical that it will work a sixth time. Patient wishes to  be more aggressively consider surgery. Would plan hemorrhoidal ligation and pexy. Removal of external masses. She is interested in proceeding.   I strongly recommend that she correct her constipation and increase her Benefiber. Go from once a day to at least twice a day. Can go up to 6 doses a day. Still  struggling, revisit with her gastroenterologist. The hemorrhoids are side effect of her constipation and irritable bowel. Get that under control, and I will help keep the hemorrhoids from worsening.    The anatomy & physiology of the anorectal region was discussed. The pathophysiology of hemorrhoids and differential diagnosis was discussed. Natural history progression was discussed. I stressed the importance of a bowel regimen to have daily soft bowel movements to minimize progression of disease. Goal of one BM / day ideal. Use of wet wipes, warm baths, avoiding straining, etc were emphasized.  Educational handouts further explaining the pathology, treatment options, and bowel regimen were given as well. The patient expressed understanding.    Current Plans  You are being scheduled for surgery - Our schedulers will call you.  You should hear from our office's scheduling department within 5 working days about the location, date, and time of surgery. We try to make accommodations for patient's preferences in scheduling surgery, but sometimes the OR schedule or the surgeon's schedule prevents [place] from making those accommodations.  If you have not heard from our office [phone]) in 5 working days, call the office and ask for your surgeon's nurse.  If you have other questions about your diagnosis, plan, or surgery, call the office and ask for your surgeon's nurse.  Pt Education - Pamphlet Given - The Hemorrhoid Book: discussed with patient and provided information.  The anatomy & physiology of the anorectal region was discussed. The pathophysiology of hemorrhoids and differential diagnosis was discussed. Natural history risks without surgery was discussed. I stressed the importance of a bowel regimen to have daily soft bowel movements to minimize progression of disease. Interventions such as sclerotherapy & banding were discussed.  The patient's symptoms are not adequately controlled by medicines and  other non-operative treatments. I feel the risks & problems of no surgery outweigh the operative risks; therefore, I recommended surgery to treat the hemorrhoids by ligation, pexy, and possible resection.  Risks such as bleeding, infection, urinary difficulties, need for further treatment, heart attack, death, and other risks were discussed. I noted a good likelihood this will help address the problem. Goals of post-operative recovery were discussed as well. Possibility that this will not correct all symptoms was explained. Post-operative pain, bleeding, constipation, and other problems after surgery were discussed. We will work to minimize complications. Educational handouts further explaining the pathology, treatment options, and bowel regimen were given as well. Questions were answered. The patient expresses understanding & wishes to proceed with surgery.  PROLAPSED INTERNAL HEMORRHOIDS, GRADE 2 (K64.1)  Current Plans  ANOSCOPY, DIAGNOSTIC [id number])  Pt Education - CCS Hemorrhoids (Juliya Magill): discussed with patient and provided information.  EXTERNAL HEMORRHOIDS WITH COMPLICATION (0000000)  ENCOUNTER FOR PREOPERATIVE EXAMINATION FOR GENERAL SURGICAL PROCEDURE (Z01.818)  Current Plans  Pt Education - CCS Rectal Prep for Anorectal outpatient/office surgery: discussed with patient and provided information.  Pt Education - [facility]CS Rectal Surgery HCI (Ameri Cahoon): discussed with patient and provided information.  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  HISTORY OF ADENOMATOUS POLYP OF  COLON (Z86.010)  Impression: Small tubular adenoma removed without incident 12/2018. For follow-up screening per her gastroenterologist.  Current Plans  Pt Education - Polyps in the Colon and Rectum (Colonic and Rectal Polyps): colonic polyps  Adin Hector, MD, FACS, MASCRS    Gastrointestinal and Minimally Invasive Surgery  [facility]  [address] [place]  [place], [place] [address]  [phone] Main / Paging  [phone] Fax

## 2019-11-28 LAB — SURGICAL PATHOLOGY

## 2019-12-11 ENCOUNTER — Ambulatory Visit: Payer: Medicare Other | Attending: Internal Medicine

## 2019-12-11 DIAGNOSIS — Z23 Encounter for immunization: Secondary | ICD-10-CM | POA: Insufficient documentation

## 2019-12-11 NOTE — Progress Notes (Signed)
   Covid-19 Vaccination Clinic  Name:  Valerie Hull    MRN: [id number] DOB: 01-27-1941  12/11/2019  [name] was observed post Covid-19 immunization for 15 minutes without incidence. She was provided with Vaccine Information Sheet and instruction to access the V-Safe system.   [name] was instructed to call 911 with any severe reactions post vaccine: Marland Kitchen Difficulty breathing  . Swelling of your face and throat  . A fast heartbeat  . A bad rash all over your body  . Dizziness and weakness    Immunizations Administered    Name Date Dose VIS Date Route   Pfizer COVID-19 Vaccine 12/11/2019 10:49 AM 0.3 mL 10/14/2019 Intramuscular   Manufacturer: [place]   Lot: [id number]   Show Low: [id number]

## 2019-12-19 DIAGNOSIS — E7800 Pure hypercholesterolemia, unspecified: Secondary | ICD-10-CM | POA: Diagnosis not present

## 2019-12-19 DIAGNOSIS — E559 Vitamin D deficiency, unspecified: Secondary | ICD-10-CM | POA: Diagnosis not present

## 2019-12-26 DIAGNOSIS — M5416 Radiculopathy, lumbar region: Secondary | ICD-10-CM | POA: Diagnosis not present

## 2020-01-24 DIAGNOSIS — M5416 Radiculopathy, lumbar region: Secondary | ICD-10-CM | POA: Diagnosis not present

## 2020-01-24 DIAGNOSIS — M4120 Other idiopathic scoliosis, site unspecified: Secondary | ICD-10-CM | POA: Diagnosis not present

## 2020-01-24 DIAGNOSIS — E559 Vitamin D deficiency, unspecified: Secondary | ICD-10-CM | POA: Diagnosis not present

## 2020-02-20 DIAGNOSIS — E559 Vitamin D deficiency, unspecified: Secondary | ICD-10-CM | POA: Diagnosis not present

## 2020-02-20 DIAGNOSIS — E7800 Pure hypercholesterolemia, unspecified: Secondary | ICD-10-CM | POA: Diagnosis not present

## 2020-03-26 DIAGNOSIS — M5416 Radiculopathy, lumbar region: Secondary | ICD-10-CM | POA: Diagnosis not present

## 2020-04-24 DIAGNOSIS — M5416 Radiculopathy, lumbar region: Secondary | ICD-10-CM | POA: Diagnosis not present

## 2020-04-24 DIAGNOSIS — M4120 Other idiopathic scoliosis, site unspecified: Secondary | ICD-10-CM | POA: Diagnosis not present

## 2020-05-19 DIAGNOSIS — S2231XA Fracture of one rib, right side, initial encounter for closed fracture: Secondary | ICD-10-CM | POA: Diagnosis not present

## 2020-05-19 DIAGNOSIS — S42032D Displaced fracture of lateral end of left clavicle, subsequent encounter for fracture with routine healing: Secondary | ICD-10-CM | POA: Diagnosis not present

## 2020-05-19 DIAGNOSIS — M84362S Stress fracture, left tibia, sequela: Secondary | ICD-10-CM | POA: Diagnosis not present

## 2020-05-19 DIAGNOSIS — S59902A Unspecified injury of left elbow, initial encounter: Secondary | ICD-10-CM | POA: Diagnosis not present

## 2020-05-19 DIAGNOSIS — Z888 Allergy status to other drugs, medicaments and biological substances status: Secondary | ICD-10-CM | POA: Diagnosis not present

## 2020-05-19 DIAGNOSIS — S2239XA Fracture of one rib, unspecified side, initial encounter for closed fracture: Secondary | ICD-10-CM | POA: Diagnosis not present

## 2020-05-19 DIAGNOSIS — S139XXA Sprain of joints and ligaments of unspecified parts of neck, initial encounter: Secondary | ICD-10-CM | POA: Diagnosis not present

## 2020-05-19 DIAGNOSIS — Z9181 History of falling: Secondary | ICD-10-CM | POA: Diagnosis not present

## 2020-05-19 DIAGNOSIS — M6281 Muscle weakness (generalized): Secondary | ICD-10-CM | POA: Diagnosis not present

## 2020-05-19 DIAGNOSIS — S36112A Contusion of liver, initial encounter: Secondary | ICD-10-CM | POA: Diagnosis not present

## 2020-05-19 DIAGNOSIS — S42032A Displaced fracture of lateral end of left clavicle, initial encounter for closed fracture: Secondary | ICD-10-CM | POA: Diagnosis present

## 2020-05-19 DIAGNOSIS — M810 Age-related osteoporosis without current pathological fracture: Secondary | ICD-10-CM | POA: Diagnosis not present

## 2020-05-19 DIAGNOSIS — S82132A Displaced fracture of medial condyle of left tibia, initial encounter for closed fracture: Secondary | ICD-10-CM | POA: Diagnosis not present

## 2020-05-19 DIAGNOSIS — R278 Other lack of coordination: Secondary | ICD-10-CM | POA: Diagnosis not present

## 2020-05-19 DIAGNOSIS — M419 Scoliosis, unspecified: Secondary | ICD-10-CM | POA: Diagnosis not present

## 2020-05-19 DIAGNOSIS — R52 Pain, unspecified: Secondary | ICD-10-CM | POA: Diagnosis not present

## 2020-05-19 DIAGNOSIS — S82832A Other fracture of upper and lower end of left fibula, initial encounter for closed fracture: Secondary | ICD-10-CM | POA: Diagnosis not present

## 2020-05-19 DIAGNOSIS — S82142A Displaced bicondylar fracture of left tibia, initial encounter for closed fracture: Secondary | ICD-10-CM | POA: Diagnosis not present

## 2020-05-19 DIAGNOSIS — S42009A Fracture of unspecified part of unspecified clavicle, initial encounter for closed fracture: Secondary | ICD-10-CM | POA: Diagnosis not present

## 2020-05-19 DIAGNOSIS — R109 Unspecified abdominal pain: Secondary | ICD-10-CM | POA: Diagnosis not present

## 2020-05-19 DIAGNOSIS — S82102A Unspecified fracture of upper end of left tibia, initial encounter for closed fracture: Secondary | ICD-10-CM | POA: Diagnosis not present

## 2020-05-19 DIAGNOSIS — E876 Hypokalemia: Secondary | ICD-10-CM | POA: Diagnosis not present

## 2020-05-19 DIAGNOSIS — Z885 Allergy status to narcotic agent status: Secondary | ICD-10-CM | POA: Diagnosis not present

## 2020-05-19 DIAGNOSIS — K219 Gastro-esophageal reflux disease without esophagitis: Secondary | ICD-10-CM | POA: Diagnosis present

## 2020-05-19 DIAGNOSIS — S199XXA Unspecified injury of neck, initial encounter: Secondary | ICD-10-CM | POA: Diagnosis not present

## 2020-05-19 DIAGNOSIS — S51012A Laceration without foreign body of left elbow, initial encounter: Secondary | ICD-10-CM | POA: Diagnosis present

## 2020-05-19 DIAGNOSIS — Z01818 Encounter for other preprocedural examination: Secondary | ICD-10-CM | POA: Diagnosis not present

## 2020-05-19 DIAGNOSIS — D72829 Elevated white blood cell count, unspecified: Secondary | ICD-10-CM | POA: Diagnosis present

## 2020-05-19 DIAGNOSIS — S82142D Displaced bicondylar fracture of left tibia, subsequent encounter for closed fracture with routine healing: Secondary | ICD-10-CM | POA: Diagnosis not present

## 2020-05-19 DIAGNOSIS — S2241XD Multiple fractures of ribs, right side, subsequent encounter for fracture with routine healing: Secondary | ICD-10-CM | POA: Diagnosis not present

## 2020-05-19 DIAGNOSIS — S2242XA Multiple fractures of ribs, left side, initial encounter for closed fracture: Secondary | ICD-10-CM | POA: Diagnosis present

## 2020-05-19 DIAGNOSIS — Z880 Allergy status to penicillin: Secondary | ICD-10-CM | POA: Diagnosis not present

## 2020-05-19 DIAGNOSIS — S82122A Displaced fracture of lateral condyle of left tibia, initial encounter for closed fracture: Secondary | ICD-10-CM | POA: Diagnosis not present

## 2020-05-19 DIAGNOSIS — G4700 Insomnia, unspecified: Secondary | ICD-10-CM | POA: Diagnosis not present

## 2020-05-19 DIAGNOSIS — S42002A Fracture of unspecified part of left clavicle, initial encounter for closed fracture: Secondary | ICD-10-CM | POA: Diagnosis not present

## 2020-05-19 DIAGNOSIS — R0689 Other abnormalities of breathing: Secondary | ICD-10-CM | POA: Diagnosis not present

## 2020-05-19 DIAGNOSIS — S0990XA Unspecified injury of head, initial encounter: Secondary | ICD-10-CM | POA: Diagnosis not present

## 2020-05-19 DIAGNOSIS — M542 Cervicalgia: Secondary | ICD-10-CM | POA: Diagnosis not present

## 2020-05-19 DIAGNOSIS — S299XXA Unspecified injury of thorax, initial encounter: Secondary | ICD-10-CM | POA: Diagnosis not present

## 2020-05-19 DIAGNOSIS — D649 Anemia, unspecified: Secondary | ICD-10-CM | POA: Diagnosis not present

## 2020-05-19 DIAGNOSIS — M21062 Valgus deformity, not elsewhere classified, left knee: Secondary | ICD-10-CM | POA: Diagnosis not present

## 2020-05-19 DIAGNOSIS — G8911 Acute pain due to trauma: Secondary | ICD-10-CM | POA: Diagnosis present

## 2020-05-19 DIAGNOSIS — I10 Essential (primary) hypertension: Secondary | ICD-10-CM | POA: Diagnosis not present

## 2020-05-19 DIAGNOSIS — M25422 Effusion, left elbow: Secondary | ICD-10-CM | POA: Diagnosis not present

## 2020-05-19 DIAGNOSIS — R2681 Unsteadiness on feet: Secondary | ICD-10-CM | POA: Diagnosis not present

## 2020-05-19 DIAGNOSIS — S82402A Unspecified fracture of shaft of left fibula, initial encounter for closed fracture: Secondary | ICD-10-CM | POA: Diagnosis not present

## 2020-05-19 DIAGNOSIS — S2241XA Multiple fractures of ribs, right side, initial encounter for closed fracture: Secondary | ICD-10-CM | POA: Diagnosis not present

## 2020-05-19 DIAGNOSIS — S82202D Unspecified fracture of shaft of left tibia, subsequent encounter for closed fracture with routine healing: Secondary | ICD-10-CM | POA: Diagnosis not present

## 2020-05-19 DIAGNOSIS — S3991XA Unspecified injury of abdomen, initial encounter: Secondary | ICD-10-CM | POA: Diagnosis not present

## 2020-05-19 DIAGNOSIS — W19XXXA Unspecified fall, initial encounter: Secondary | ICD-10-CM | POA: Diagnosis not present

## 2020-05-19 DIAGNOSIS — R2689 Other abnormalities of gait and mobility: Secondary | ICD-10-CM | POA: Diagnosis not present

## 2020-05-19 DIAGNOSIS — R0902 Hypoxemia: Secondary | ICD-10-CM | POA: Diagnosis not present

## 2020-05-19 DIAGNOSIS — Z20822 Contact with and (suspected) exposure to covid-19: Secondary | ICD-10-CM | POA: Diagnosis present

## 2020-05-19 DIAGNOSIS — S134XXA Sprain of ligaments of cervical spine, initial encounter: Secondary | ICD-10-CM | POA: Diagnosis not present

## 2020-05-20 DIAGNOSIS — S51012A Laceration without foreign body of left elbow, initial encounter: Secondary | ICD-10-CM | POA: Diagnosis present

## 2020-05-20 DIAGNOSIS — S2242XA Multiple fractures of ribs, left side, initial encounter for closed fracture: Secondary | ICD-10-CM | POA: Diagnosis not present

## 2020-05-20 DIAGNOSIS — S82832A Other fracture of upper and lower end of left fibula, initial encounter for closed fracture: Secondary | ICD-10-CM | POA: Diagnosis not present

## 2020-05-20 DIAGNOSIS — R278 Other lack of coordination: Secondary | ICD-10-CM | POA: Diagnosis not present

## 2020-05-20 DIAGNOSIS — M419 Scoliosis, unspecified: Secondary | ICD-10-CM | POA: Diagnosis not present

## 2020-05-20 DIAGNOSIS — G8911 Acute pain due to trauma: Secondary | ICD-10-CM | POA: Diagnosis not present

## 2020-05-20 DIAGNOSIS — S42032D Displaced fracture of lateral end of left clavicle, subsequent encounter for fracture with routine healing: Secondary | ICD-10-CM | POA: Diagnosis not present

## 2020-05-20 DIAGNOSIS — Z880 Allergy status to penicillin: Secondary | ICD-10-CM | POA: Diagnosis not present

## 2020-05-20 DIAGNOSIS — S82142A Displaced bicondylar fracture of left tibia, initial encounter for closed fracture: Secondary | ICD-10-CM | POA: Diagnosis not present

## 2020-05-20 DIAGNOSIS — G4700 Insomnia, unspecified: Secondary | ICD-10-CM | POA: Diagnosis not present

## 2020-05-20 DIAGNOSIS — S82202D Unspecified fracture of shaft of left tibia, subsequent encounter for closed fracture with routine healing: Secondary | ICD-10-CM | POA: Diagnosis not present

## 2020-05-20 DIAGNOSIS — Z888 Allergy status to other drugs, medicaments and biological substances status: Secondary | ICD-10-CM | POA: Diagnosis not present

## 2020-05-20 DIAGNOSIS — Z885 Allergy status to narcotic agent status: Secondary | ICD-10-CM | POA: Diagnosis not present

## 2020-05-20 DIAGNOSIS — S42032A Displaced fracture of lateral end of left clavicle, initial encounter for closed fracture: Secondary | ICD-10-CM | POA: Diagnosis not present

## 2020-05-20 DIAGNOSIS — S42009A Fracture of unspecified part of unspecified clavicle, initial encounter for closed fracture: Secondary | ICD-10-CM | POA: Diagnosis not present

## 2020-05-20 DIAGNOSIS — S42002A Fracture of unspecified part of left clavicle, initial encounter for closed fracture: Secondary | ICD-10-CM | POA: Diagnosis not present

## 2020-05-20 DIAGNOSIS — Z01818 Encounter for other preprocedural examination: Secondary | ICD-10-CM | POA: Diagnosis not present

## 2020-05-20 DIAGNOSIS — D649 Anemia, unspecified: Secondary | ICD-10-CM | POA: Diagnosis not present

## 2020-05-20 DIAGNOSIS — E876 Hypokalemia: Secondary | ICD-10-CM | POA: Diagnosis not present

## 2020-05-20 DIAGNOSIS — Z9181 History of falling: Secondary | ICD-10-CM | POA: Diagnosis not present

## 2020-05-20 DIAGNOSIS — S82142D Displaced bicondylar fracture of left tibia, subsequent encounter for closed fracture with routine healing: Secondary | ICD-10-CM | POA: Diagnosis not present

## 2020-05-20 DIAGNOSIS — S82102A Unspecified fracture of upper end of left tibia, initial encounter for closed fracture: Secondary | ICD-10-CM | POA: Diagnosis not present

## 2020-05-20 DIAGNOSIS — D72829 Elevated white blood cell count, unspecified: Secondary | ICD-10-CM | POA: Diagnosis not present

## 2020-05-20 DIAGNOSIS — S2241XD Multiple fractures of ribs, right side, subsequent encounter for fracture with routine healing: Secondary | ICD-10-CM | POA: Diagnosis not present

## 2020-05-20 DIAGNOSIS — M810 Age-related osteoporosis without current pathological fracture: Secondary | ICD-10-CM | POA: Diagnosis not present

## 2020-05-20 DIAGNOSIS — S2241XA Multiple fractures of ribs, right side, initial encounter for closed fracture: Secondary | ICD-10-CM | POA: Diagnosis not present

## 2020-05-20 DIAGNOSIS — K219 Gastro-esophageal reflux disease without esophagitis: Secondary | ICD-10-CM | POA: Diagnosis not present

## 2020-05-20 DIAGNOSIS — S2239XA Fracture of one rib, unspecified side, initial encounter for closed fracture: Secondary | ICD-10-CM | POA: Diagnosis not present

## 2020-05-20 DIAGNOSIS — R2681 Unsteadiness on feet: Secondary | ICD-10-CM | POA: Diagnosis not present

## 2020-05-20 DIAGNOSIS — S82132A Displaced fracture of medial condyle of left tibia, initial encounter for closed fracture: Secondary | ICD-10-CM | POA: Diagnosis not present

## 2020-05-20 DIAGNOSIS — Z20822 Contact with and (suspected) exposure to covid-19: Secondary | ICD-10-CM | POA: Diagnosis present

## 2020-05-20 DIAGNOSIS — S82122A Displaced fracture of lateral condyle of left tibia, initial encounter for closed fracture: Secondary | ICD-10-CM | POA: Diagnosis not present

## 2020-05-20 DIAGNOSIS — M6281 Muscle weakness (generalized): Secondary | ICD-10-CM | POA: Diagnosis not present

## 2020-05-20 DIAGNOSIS — M84362S Stress fracture, left tibia, sequela: Secondary | ICD-10-CM | POA: Diagnosis not present

## 2020-05-20 DIAGNOSIS — R2689 Other abnormalities of gait and mobility: Secondary | ICD-10-CM | POA: Diagnosis not present

## 2020-05-20 DIAGNOSIS — M542 Cervicalgia: Secondary | ICD-10-CM | POA: Diagnosis not present

## 2020-06-06 DIAGNOSIS — S82142A Displaced bicondylar fracture of left tibia, initial encounter for closed fracture: Secondary | ICD-10-CM | POA: Diagnosis not present

## 2020-06-06 DIAGNOSIS — D509 Iron deficiency anemia, unspecified: Secondary | ICD-10-CM | POA: Diagnosis not present

## 2020-06-06 DIAGNOSIS — R278 Other lack of coordination: Secondary | ICD-10-CM | POA: Diagnosis not present

## 2020-06-06 DIAGNOSIS — S42022A Displaced fracture of shaft of left clavicle, initial encounter for closed fracture: Secondary | ICD-10-CM | POA: Diagnosis not present

## 2020-06-06 DIAGNOSIS — S42032D Displaced fracture of lateral end of left clavicle, subsequent encounter for fracture with routine healing: Secondary | ICD-10-CM | POA: Diagnosis not present

## 2020-06-06 DIAGNOSIS — K219 Gastro-esophageal reflux disease without esophagitis: Secondary | ICD-10-CM | POA: Diagnosis not present

## 2020-06-06 DIAGNOSIS — M810 Age-related osteoporosis without current pathological fracture: Secondary | ICD-10-CM | POA: Diagnosis not present

## 2020-06-06 DIAGNOSIS — R2689 Other abnormalities of gait and mobility: Secondary | ICD-10-CM | POA: Diagnosis not present

## 2020-06-06 DIAGNOSIS — Z9181 History of falling: Secondary | ICD-10-CM | POA: Diagnosis not present

## 2020-06-06 DIAGNOSIS — W109XXD Fall (on) (from) unspecified stairs and steps, subsequent encounter: Secondary | ICD-10-CM | POA: Diagnosis not present

## 2020-06-06 DIAGNOSIS — M84362S Stress fracture, left tibia, sequela: Secondary | ICD-10-CM | POA: Diagnosis not present

## 2020-06-06 DIAGNOSIS — S42022D Displaced fracture of shaft of left clavicle, subsequent encounter for fracture with routine healing: Secondary | ICD-10-CM | POA: Diagnosis not present

## 2020-06-06 DIAGNOSIS — S82142D Displaced bicondylar fracture of left tibia, subsequent encounter for closed fracture with routine healing: Secondary | ICD-10-CM | POA: Diagnosis not present

## 2020-06-06 DIAGNOSIS — S82192D Other fracture of upper end of left tibia, subsequent encounter for closed fracture with routine healing: Secondary | ICD-10-CM | POA: Diagnosis not present

## 2020-06-06 DIAGNOSIS — R2681 Unsteadiness on feet: Secondary | ICD-10-CM | POA: Diagnosis not present

## 2020-06-06 DIAGNOSIS — G8911 Acute pain due to trauma: Secondary | ICD-10-CM | POA: Diagnosis not present

## 2020-06-06 DIAGNOSIS — M419 Scoliosis, unspecified: Secondary | ICD-10-CM | POA: Diagnosis not present

## 2020-06-06 DIAGNOSIS — D649 Anemia, unspecified: Secondary | ICD-10-CM | POA: Diagnosis not present

## 2020-06-06 DIAGNOSIS — M6281 Muscle weakness (generalized): Secondary | ICD-10-CM | POA: Diagnosis not present

## 2020-06-06 DIAGNOSIS — S2241XD Multiple fractures of ribs, right side, subsequent encounter for fracture with routine healing: Secondary | ICD-10-CM | POA: Diagnosis not present

## 2020-06-06 DIAGNOSIS — G4700 Insomnia, unspecified: Secondary | ICD-10-CM | POA: Diagnosis not present

## 2020-06-08 DIAGNOSIS — K219 Gastro-esophageal reflux disease without esophagitis: Secondary | ICD-10-CM | POA: Diagnosis not present

## 2020-06-08 DIAGNOSIS — W109XXD Fall (on) (from) unspecified stairs and steps, subsequent encounter: Secondary | ICD-10-CM | POA: Diagnosis not present

## 2020-06-08 DIAGNOSIS — S82192D Other fracture of upper end of left tibia, subsequent encounter for closed fracture with routine healing: Secondary | ICD-10-CM | POA: Diagnosis not present

## 2020-06-08 DIAGNOSIS — D509 Iron deficiency anemia, unspecified: Secondary | ICD-10-CM | POA: Diagnosis not present

## 2020-06-12 DIAGNOSIS — S82142A Displaced bicondylar fracture of left tibia, initial encounter for closed fracture: Secondary | ICD-10-CM | POA: Diagnosis not present

## 2020-06-12 DIAGNOSIS — S42022A Displaced fracture of shaft of left clavicle, initial encounter for closed fracture: Secondary | ICD-10-CM | POA: Diagnosis not present

## 2020-07-03 DIAGNOSIS — S42022D Displaced fracture of shaft of left clavicle, subsequent encounter for fracture with routine healing: Secondary | ICD-10-CM | POA: Diagnosis not present

## 2020-07-03 DIAGNOSIS — S82142D Displaced bicondylar fracture of left tibia, subsequent encounter for closed fracture with routine healing: Secondary | ICD-10-CM | POA: Diagnosis not present

## 2020-07-13 DIAGNOSIS — K219 Gastro-esophageal reflux disease without esophagitis: Secondary | ICD-10-CM | POA: Diagnosis not present

## 2020-07-13 DIAGNOSIS — G4700 Insomnia, unspecified: Secondary | ICD-10-CM | POA: Diagnosis not present

## 2020-07-13 DIAGNOSIS — M80012D Age-related osteoporosis with current pathological fracture, left shoulder, subsequent encounter for fracture with routine healing: Secondary | ICD-10-CM | POA: Diagnosis not present

## 2020-07-13 DIAGNOSIS — Z9181 History of falling: Secondary | ICD-10-CM | POA: Diagnosis not present

## 2020-07-13 DIAGNOSIS — M419 Scoliosis, unspecified: Secondary | ICD-10-CM | POA: Diagnosis not present

## 2020-07-13 DIAGNOSIS — S82142D Displaced bicondylar fracture of left tibia, subsequent encounter for closed fracture with routine healing: Secondary | ICD-10-CM | POA: Diagnosis not present

## 2020-07-13 DIAGNOSIS — M80062D Age-related osteoporosis with current pathological fracture, left lower leg, subsequent encounter for fracture with routine healing: Secondary | ICD-10-CM | POA: Diagnosis not present

## 2020-07-13 DIAGNOSIS — M800AXD Age-related osteoporosis with current pathological fracture, other site, subsequent encounter for fracture with routine healing: Secondary | ICD-10-CM | POA: Diagnosis not present

## 2020-07-14 DIAGNOSIS — G4700 Insomnia, unspecified: Secondary | ICD-10-CM | POA: Diagnosis not present

## 2020-07-14 DIAGNOSIS — M80012D Age-related osteoporosis with current pathological fracture, left shoulder, subsequent encounter for fracture with routine healing: Secondary | ICD-10-CM | POA: Diagnosis not present

## 2020-07-14 DIAGNOSIS — M800AXD Age-related osteoporosis with current pathological fracture, other site, subsequent encounter for fracture with routine healing: Secondary | ICD-10-CM | POA: Diagnosis not present

## 2020-07-14 DIAGNOSIS — M419 Scoliosis, unspecified: Secondary | ICD-10-CM | POA: Diagnosis not present

## 2020-07-14 DIAGNOSIS — K219 Gastro-esophageal reflux disease without esophagitis: Secondary | ICD-10-CM | POA: Diagnosis not present

## 2020-07-14 DIAGNOSIS — M80062D Age-related osteoporosis with current pathological fracture, left lower leg, subsequent encounter for fracture with routine healing: Secondary | ICD-10-CM | POA: Diagnosis not present

## 2020-07-17 DIAGNOSIS — M419 Scoliosis, unspecified: Secondary | ICD-10-CM | POA: Diagnosis not present

## 2020-07-17 DIAGNOSIS — M80012D Age-related osteoporosis with current pathological fracture, left shoulder, subsequent encounter for fracture with routine healing: Secondary | ICD-10-CM | POA: Diagnosis not present

## 2020-07-17 DIAGNOSIS — K219 Gastro-esophageal reflux disease without esophagitis: Secondary | ICD-10-CM | POA: Diagnosis not present

## 2020-07-17 DIAGNOSIS — M800AXD Age-related osteoporosis with current pathological fracture, other site, subsequent encounter for fracture with routine healing: Secondary | ICD-10-CM | POA: Diagnosis not present

## 2020-07-17 DIAGNOSIS — G4700 Insomnia, unspecified: Secondary | ICD-10-CM | POA: Diagnosis not present

## 2020-07-17 DIAGNOSIS — M80062D Age-related osteoporosis with current pathological fracture, left lower leg, subsequent encounter for fracture with routine healing: Secondary | ICD-10-CM | POA: Diagnosis not present

## 2020-07-19 DIAGNOSIS — K219 Gastro-esophageal reflux disease without esophagitis: Secondary | ICD-10-CM | POA: Diagnosis not present

## 2020-07-19 DIAGNOSIS — I10 Essential (primary) hypertension: Secondary | ICD-10-CM | POA: Diagnosis not present

## 2020-07-19 DIAGNOSIS — Z23 Encounter for immunization: Secondary | ICD-10-CM | POA: Diagnosis not present

## 2020-07-20 DIAGNOSIS — M800AXD Age-related osteoporosis with current pathological fracture, other site, subsequent encounter for fracture with routine healing: Secondary | ICD-10-CM | POA: Diagnosis not present

## 2020-07-20 DIAGNOSIS — G4700 Insomnia, unspecified: Secondary | ICD-10-CM | POA: Diagnosis not present

## 2020-07-20 DIAGNOSIS — K219 Gastro-esophageal reflux disease without esophagitis: Secondary | ICD-10-CM | POA: Diagnosis not present

## 2020-07-20 DIAGNOSIS — M419 Scoliosis, unspecified: Secondary | ICD-10-CM | POA: Diagnosis not present

## 2020-07-20 DIAGNOSIS — M80062D Age-related osteoporosis with current pathological fracture, left lower leg, subsequent encounter for fracture with routine healing: Secondary | ICD-10-CM | POA: Diagnosis not present

## 2020-07-20 DIAGNOSIS — M80012D Age-related osteoporosis with current pathological fracture, left shoulder, subsequent encounter for fracture with routine healing: Secondary | ICD-10-CM | POA: Diagnosis not present

## 2020-07-25 DIAGNOSIS — G4700 Insomnia, unspecified: Secondary | ICD-10-CM | POA: Diagnosis not present

## 2020-07-25 DIAGNOSIS — M80062D Age-related osteoporosis with current pathological fracture, left lower leg, subsequent encounter for fracture with routine healing: Secondary | ICD-10-CM | POA: Diagnosis not present

## 2020-07-25 DIAGNOSIS — M419 Scoliosis, unspecified: Secondary | ICD-10-CM | POA: Diagnosis not present

## 2020-07-25 DIAGNOSIS — M800AXD Age-related osteoporosis with current pathological fracture, other site, subsequent encounter for fracture with routine healing: Secondary | ICD-10-CM | POA: Diagnosis not present

## 2020-07-25 DIAGNOSIS — M80012D Age-related osteoporosis with current pathological fracture, left shoulder, subsequent encounter for fracture with routine healing: Secondary | ICD-10-CM | POA: Diagnosis not present

## 2020-07-25 DIAGNOSIS — K219 Gastro-esophageal reflux disease without esophagitis: Secondary | ICD-10-CM | POA: Diagnosis not present

## 2020-07-27 DIAGNOSIS — K219 Gastro-esophageal reflux disease without esophagitis: Secondary | ICD-10-CM | POA: Diagnosis not present

## 2020-07-27 DIAGNOSIS — M800AXD Age-related osteoporosis with current pathological fracture, other site, subsequent encounter for fracture with routine healing: Secondary | ICD-10-CM | POA: Diagnosis not present

## 2020-07-27 DIAGNOSIS — G4700 Insomnia, unspecified: Secondary | ICD-10-CM | POA: Diagnosis not present

## 2020-07-27 DIAGNOSIS — M80062D Age-related osteoporosis with current pathological fracture, left lower leg, subsequent encounter for fracture with routine healing: Secondary | ICD-10-CM | POA: Diagnosis not present

## 2020-07-27 DIAGNOSIS — M419 Scoliosis, unspecified: Secondary | ICD-10-CM | POA: Diagnosis not present

## 2020-07-27 DIAGNOSIS — M80012D Age-related osteoporosis with current pathological fracture, left shoulder, subsequent encounter for fracture with routine healing: Secondary | ICD-10-CM | POA: Diagnosis not present

## 2020-07-30 DIAGNOSIS — M800AXD Age-related osteoporosis with current pathological fracture, other site, subsequent encounter for fracture with routine healing: Secondary | ICD-10-CM | POA: Diagnosis not present

## 2020-07-30 DIAGNOSIS — G4700 Insomnia, unspecified: Secondary | ICD-10-CM | POA: Diagnosis not present

## 2020-07-30 DIAGNOSIS — M80012D Age-related osteoporosis with current pathological fracture, left shoulder, subsequent encounter for fracture with routine healing: Secondary | ICD-10-CM | POA: Diagnosis not present

## 2020-07-30 DIAGNOSIS — K219 Gastro-esophageal reflux disease without esophagitis: Secondary | ICD-10-CM | POA: Diagnosis not present

## 2020-07-30 DIAGNOSIS — M419 Scoliosis, unspecified: Secondary | ICD-10-CM | POA: Diagnosis not present

## 2020-07-30 DIAGNOSIS — M80062D Age-related osteoporosis with current pathological fracture, left lower leg, subsequent encounter for fracture with routine healing: Secondary | ICD-10-CM | POA: Diagnosis not present

## 2020-07-31 DIAGNOSIS — S82142D Displaced bicondylar fracture of left tibia, subsequent encounter for closed fracture with routine healing: Secondary | ICD-10-CM | POA: Diagnosis not present

## 2020-07-31 DIAGNOSIS — M80012D Age-related osteoporosis with current pathological fracture, left shoulder, subsequent encounter for fracture with routine healing: Secondary | ICD-10-CM | POA: Diagnosis not present

## 2020-07-31 DIAGNOSIS — M800AXD Age-related osteoporosis with current pathological fracture, other site, subsequent encounter for fracture with routine healing: Secondary | ICD-10-CM | POA: Diagnosis not present

## 2020-07-31 DIAGNOSIS — S42022D Displaced fracture of shaft of left clavicle, subsequent encounter for fracture with routine healing: Secondary | ICD-10-CM | POA: Diagnosis not present

## 2020-07-31 DIAGNOSIS — K219 Gastro-esophageal reflux disease without esophagitis: Secondary | ICD-10-CM | POA: Diagnosis not present

## 2020-07-31 DIAGNOSIS — M80062D Age-related osteoporosis with current pathological fracture, left lower leg, subsequent encounter for fracture with routine healing: Secondary | ICD-10-CM | POA: Diagnosis not present

## 2020-07-31 DIAGNOSIS — G4700 Insomnia, unspecified: Secondary | ICD-10-CM | POA: Diagnosis not present

## 2020-07-31 DIAGNOSIS — M419 Scoliosis, unspecified: Secondary | ICD-10-CM | POA: Diagnosis not present

## 2020-08-02 DIAGNOSIS — M419 Scoliosis, unspecified: Secondary | ICD-10-CM | POA: Diagnosis not present

## 2020-08-02 DIAGNOSIS — K219 Gastro-esophageal reflux disease without esophagitis: Secondary | ICD-10-CM | POA: Diagnosis not present

## 2020-08-02 DIAGNOSIS — G4700 Insomnia, unspecified: Secondary | ICD-10-CM | POA: Diagnosis not present

## 2020-08-02 DIAGNOSIS — M800AXD Age-related osteoporosis with current pathological fracture, other site, subsequent encounter for fracture with routine healing: Secondary | ICD-10-CM | POA: Diagnosis not present

## 2020-08-02 DIAGNOSIS — M80012D Age-related osteoporosis with current pathological fracture, left shoulder, subsequent encounter for fracture with routine healing: Secondary | ICD-10-CM | POA: Diagnosis not present

## 2020-08-02 DIAGNOSIS — M80062D Age-related osteoporosis with current pathological fracture, left lower leg, subsequent encounter for fracture with routine healing: Secondary | ICD-10-CM | POA: Diagnosis not present

## 2020-08-06 DIAGNOSIS — M818 Other osteoporosis without current pathological fracture: Secondary | ICD-10-CM | POA: Diagnosis not present

## 2020-08-06 DIAGNOSIS — R262 Difficulty in walking, not elsewhere classified: Secondary | ICD-10-CM | POA: Diagnosis not present

## 2020-08-06 DIAGNOSIS — M6281 Muscle weakness (generalized): Secondary | ICD-10-CM | POA: Diagnosis not present

## 2020-08-06 DIAGNOSIS — R2681 Unsteadiness on feet: Secondary | ICD-10-CM | POA: Diagnosis not present

## 2020-08-08 DIAGNOSIS — R262 Difficulty in walking, not elsewhere classified: Secondary | ICD-10-CM | POA: Diagnosis not present

## 2020-08-08 DIAGNOSIS — R2681 Unsteadiness on feet: Secondary | ICD-10-CM | POA: Diagnosis not present

## 2020-08-08 DIAGNOSIS — M6281 Muscle weakness (generalized): Secondary | ICD-10-CM | POA: Diagnosis not present

## 2020-08-08 DIAGNOSIS — M818 Other osteoporosis without current pathological fracture: Secondary | ICD-10-CM | POA: Diagnosis not present

## 2020-08-10 DIAGNOSIS — M6281 Muscle weakness (generalized): Secondary | ICD-10-CM | POA: Diagnosis not present

## 2020-08-10 DIAGNOSIS — R262 Difficulty in walking, not elsewhere classified: Secondary | ICD-10-CM | POA: Diagnosis not present

## 2020-08-10 DIAGNOSIS — M818 Other osteoporosis without current pathological fracture: Secondary | ICD-10-CM | POA: Diagnosis not present

## 2020-08-10 DIAGNOSIS — R2681 Unsteadiness on feet: Secondary | ICD-10-CM | POA: Diagnosis not present

## 2020-08-12 DIAGNOSIS — S82142D Displaced bicondylar fracture of left tibia, subsequent encounter for closed fracture with routine healing: Secondary | ICD-10-CM | POA: Diagnosis not present

## 2020-08-13 DIAGNOSIS — M818 Other osteoporosis without current pathological fracture: Secondary | ICD-10-CM | POA: Diagnosis not present

## 2020-08-13 DIAGNOSIS — M6281 Muscle weakness (generalized): Secondary | ICD-10-CM | POA: Diagnosis not present

## 2020-08-13 DIAGNOSIS — R2681 Unsteadiness on feet: Secondary | ICD-10-CM | POA: Diagnosis not present

## 2020-08-13 DIAGNOSIS — R262 Difficulty in walking, not elsewhere classified: Secondary | ICD-10-CM | POA: Diagnosis not present

## 2020-08-15 DIAGNOSIS — R2681 Unsteadiness on feet: Secondary | ICD-10-CM | POA: Diagnosis not present

## 2020-08-15 DIAGNOSIS — M6281 Muscle weakness (generalized): Secondary | ICD-10-CM | POA: Diagnosis not present

## 2020-08-15 DIAGNOSIS — M818 Other osteoporosis without current pathological fracture: Secondary | ICD-10-CM | POA: Diagnosis not present

## 2020-08-15 DIAGNOSIS — R262 Difficulty in walking, not elsewhere classified: Secondary | ICD-10-CM | POA: Diagnosis not present

## 2020-08-17 DIAGNOSIS — R2681 Unsteadiness on feet: Secondary | ICD-10-CM | POA: Diagnosis not present

## 2020-08-17 DIAGNOSIS — M6281 Muscle weakness (generalized): Secondary | ICD-10-CM | POA: Diagnosis not present

## 2020-08-17 DIAGNOSIS — M818 Other osteoporosis without current pathological fracture: Secondary | ICD-10-CM | POA: Diagnosis not present

## 2020-08-17 DIAGNOSIS — R262 Difficulty in walking, not elsewhere classified: Secondary | ICD-10-CM | POA: Diagnosis not present

## 2020-08-20 DIAGNOSIS — R2681 Unsteadiness on feet: Secondary | ICD-10-CM | POA: Diagnosis not present

## 2020-08-20 DIAGNOSIS — M6281 Muscle weakness (generalized): Secondary | ICD-10-CM | POA: Diagnosis not present

## 2020-08-20 DIAGNOSIS — M818 Other osteoporosis without current pathological fracture: Secondary | ICD-10-CM | POA: Diagnosis not present

## 2020-08-20 DIAGNOSIS — R262 Difficulty in walking, not elsewhere classified: Secondary | ICD-10-CM | POA: Diagnosis not present

## 2020-08-22 DIAGNOSIS — M6281 Muscle weakness (generalized): Secondary | ICD-10-CM | POA: Diagnosis not present

## 2020-08-22 DIAGNOSIS — Z23 Encounter for immunization: Secondary | ICD-10-CM | POA: Diagnosis not present

## 2020-08-22 DIAGNOSIS — M818 Other osteoporosis without current pathological fracture: Secondary | ICD-10-CM | POA: Diagnosis not present

## 2020-08-22 DIAGNOSIS — R2681 Unsteadiness on feet: Secondary | ICD-10-CM | POA: Diagnosis not present

## 2020-08-22 DIAGNOSIS — R262 Difficulty in walking, not elsewhere classified: Secondary | ICD-10-CM | POA: Diagnosis not present

## 2020-08-24 DIAGNOSIS — R2681 Unsteadiness on feet: Secondary | ICD-10-CM | POA: Diagnosis not present

## 2020-08-24 DIAGNOSIS — M818 Other osteoporosis without current pathological fracture: Secondary | ICD-10-CM | POA: Diagnosis not present

## 2020-08-24 DIAGNOSIS — M6281 Muscle weakness (generalized): Secondary | ICD-10-CM | POA: Diagnosis not present

## 2020-08-24 DIAGNOSIS — R262 Difficulty in walking, not elsewhere classified: Secondary | ICD-10-CM | POA: Diagnosis not present

## 2020-08-29 DIAGNOSIS — R262 Difficulty in walking, not elsewhere classified: Secondary | ICD-10-CM | POA: Diagnosis not present

## 2020-08-29 DIAGNOSIS — M6281 Muscle weakness (generalized): Secondary | ICD-10-CM | POA: Diagnosis not present

## 2020-08-29 DIAGNOSIS — M818 Other osteoporosis without current pathological fracture: Secondary | ICD-10-CM | POA: Diagnosis not present

## 2020-09-03 DIAGNOSIS — M6281 Muscle weakness (generalized): Secondary | ICD-10-CM | POA: Diagnosis not present

## 2020-09-03 DIAGNOSIS — M818 Other osteoporosis without current pathological fracture: Secondary | ICD-10-CM | POA: Diagnosis not present

## 2020-09-03 DIAGNOSIS — R262 Difficulty in walking, not elsewhere classified: Secondary | ICD-10-CM | POA: Diagnosis not present

## 2020-09-05 DIAGNOSIS — M818 Other osteoporosis without current pathological fracture: Secondary | ICD-10-CM | POA: Diagnosis not present

## 2020-09-05 DIAGNOSIS — M6281 Muscle weakness (generalized): Secondary | ICD-10-CM | POA: Diagnosis not present

## 2020-09-05 DIAGNOSIS — R262 Difficulty in walking, not elsewhere classified: Secondary | ICD-10-CM | POA: Diagnosis not present

## 2020-09-07 DIAGNOSIS — R262 Difficulty in walking, not elsewhere classified: Secondary | ICD-10-CM | POA: Diagnosis not present

## 2020-09-07 DIAGNOSIS — M6281 Muscle weakness (generalized): Secondary | ICD-10-CM | POA: Diagnosis not present

## 2020-09-07 DIAGNOSIS — M818 Other osteoporosis without current pathological fracture: Secondary | ICD-10-CM | POA: Diagnosis not present

## 2020-09-10 DIAGNOSIS — M6281 Muscle weakness (generalized): Secondary | ICD-10-CM | POA: Diagnosis not present

## 2020-09-10 DIAGNOSIS — M818 Other osteoporosis without current pathological fracture: Secondary | ICD-10-CM | POA: Diagnosis not present

## 2020-09-10 DIAGNOSIS — R262 Difficulty in walking, not elsewhere classified: Secondary | ICD-10-CM | POA: Diagnosis not present

## 2020-09-11 DIAGNOSIS — S82142D Displaced bicondylar fracture of left tibia, subsequent encounter for closed fracture with routine healing: Secondary | ICD-10-CM | POA: Diagnosis not present

## 2020-09-11 DIAGNOSIS — S42022D Displaced fracture of shaft of left clavicle, subsequent encounter for fracture with routine healing: Secondary | ICD-10-CM | POA: Diagnosis not present

## 2020-09-12 DIAGNOSIS — M6281 Muscle weakness (generalized): Secondary | ICD-10-CM | POA: Diagnosis not present

## 2020-09-12 DIAGNOSIS — M818 Other osteoporosis without current pathological fracture: Secondary | ICD-10-CM | POA: Diagnosis not present

## 2020-09-12 DIAGNOSIS — R262 Difficulty in walking, not elsewhere classified: Secondary | ICD-10-CM | POA: Diagnosis not present

## 2020-09-14 DIAGNOSIS — R262 Difficulty in walking, not elsewhere classified: Secondary | ICD-10-CM | POA: Diagnosis not present

## 2020-09-14 DIAGNOSIS — M6281 Muscle weakness (generalized): Secondary | ICD-10-CM | POA: Diagnosis not present

## 2020-09-14 DIAGNOSIS — M818 Other osteoporosis without current pathological fracture: Secondary | ICD-10-CM | POA: Diagnosis not present

## 2020-09-17 DIAGNOSIS — R262 Difficulty in walking, not elsewhere classified: Secondary | ICD-10-CM | POA: Diagnosis not present

## 2020-09-17 DIAGNOSIS — M6281 Muscle weakness (generalized): Secondary | ICD-10-CM | POA: Diagnosis not present

## 2020-09-17 DIAGNOSIS — M818 Other osteoporosis without current pathological fracture: Secondary | ICD-10-CM | POA: Diagnosis not present

## 2020-09-19 DIAGNOSIS — R262 Difficulty in walking, not elsewhere classified: Secondary | ICD-10-CM | POA: Diagnosis not present

## 2020-09-19 DIAGNOSIS — M818 Other osteoporosis without current pathological fracture: Secondary | ICD-10-CM | POA: Diagnosis not present

## 2020-09-19 DIAGNOSIS — M6281 Muscle weakness (generalized): Secondary | ICD-10-CM | POA: Diagnosis not present

## 2020-09-21 DIAGNOSIS — M818 Other osteoporosis without current pathological fracture: Secondary | ICD-10-CM | POA: Diagnosis not present

## 2020-09-21 DIAGNOSIS — R262 Difficulty in walking, not elsewhere classified: Secondary | ICD-10-CM | POA: Diagnosis not present

## 2020-09-21 DIAGNOSIS — M6281 Muscle weakness (generalized): Secondary | ICD-10-CM | POA: Diagnosis not present

## 2020-09-24 DIAGNOSIS — M5416 Radiculopathy, lumbar region: Secondary | ICD-10-CM | POA: Diagnosis not present

## 2020-09-25 DIAGNOSIS — M810 Age-related osteoporosis without current pathological fracture: Secondary | ICD-10-CM | POA: Diagnosis not present

## 2020-09-25 DIAGNOSIS — Z01419 Encounter for gynecological examination (general) (routine) without abnormal findings: Secondary | ICD-10-CM | POA: Diagnosis not present

## 2020-10-01 DIAGNOSIS — M818 Other osteoporosis without current pathological fracture: Secondary | ICD-10-CM | POA: Diagnosis not present

## 2020-10-01 DIAGNOSIS — M6281 Muscle weakness (generalized): Secondary | ICD-10-CM | POA: Diagnosis not present

## 2020-10-01 DIAGNOSIS — R262 Difficulty in walking, not elsewhere classified: Secondary | ICD-10-CM | POA: Diagnosis not present

## 2020-10-03 DIAGNOSIS — M6281 Muscle weakness (generalized): Secondary | ICD-10-CM | POA: Diagnosis not present

## 2020-10-03 DIAGNOSIS — M818 Other osteoporosis without current pathological fracture: Secondary | ICD-10-CM | POA: Diagnosis not present

## 2020-10-03 DIAGNOSIS — R262 Difficulty in walking, not elsewhere classified: Secondary | ICD-10-CM | POA: Diagnosis not present

## 2020-10-05 DIAGNOSIS — M818 Other osteoporosis without current pathological fracture: Secondary | ICD-10-CM | POA: Diagnosis not present

## 2020-10-05 DIAGNOSIS — M6281 Muscle weakness (generalized): Secondary | ICD-10-CM | POA: Diagnosis not present

## 2020-10-05 DIAGNOSIS — R262 Difficulty in walking, not elsewhere classified: Secondary | ICD-10-CM | POA: Diagnosis not present

## 2020-10-08 DIAGNOSIS — H5213 Myopia, bilateral: Secondary | ICD-10-CM | POA: Diagnosis not present

## 2020-10-08 DIAGNOSIS — R262 Difficulty in walking, not elsewhere classified: Secondary | ICD-10-CM | POA: Diagnosis not present

## 2020-10-08 DIAGNOSIS — Z961 Presence of intraocular lens: Secondary | ICD-10-CM | POA: Diagnosis not present

## 2020-10-08 DIAGNOSIS — M6281 Muscle weakness (generalized): Secondary | ICD-10-CM | POA: Diagnosis not present

## 2020-10-08 DIAGNOSIS — M818 Other osteoporosis without current pathological fracture: Secondary | ICD-10-CM | POA: Diagnosis not present

## 2020-10-08 DIAGNOSIS — H31003 Unspecified chorioretinal scars, bilateral: Secondary | ICD-10-CM | POA: Diagnosis not present

## 2020-10-08 DIAGNOSIS — H43813 Vitreous degeneration, bilateral: Secondary | ICD-10-CM | POA: Diagnosis not present

## 2020-10-09 DIAGNOSIS — F419 Anxiety disorder, unspecified: Secondary | ICD-10-CM | POA: Diagnosis not present

## 2020-10-09 DIAGNOSIS — E7800 Pure hypercholesterolemia, unspecified: Secondary | ICD-10-CM | POA: Diagnosis not present

## 2020-10-09 DIAGNOSIS — Z0000 Encounter for general adult medical examination without abnormal findings: Secondary | ICD-10-CM | POA: Diagnosis not present

## 2020-10-09 DIAGNOSIS — I10 Essential (primary) hypertension: Secondary | ICD-10-CM | POA: Diagnosis not present

## 2020-10-09 DIAGNOSIS — K219 Gastro-esophageal reflux disease without esophagitis: Secondary | ICD-10-CM | POA: Diagnosis not present

## 2020-10-09 DIAGNOSIS — M5416 Radiculopathy, lumbar region: Secondary | ICD-10-CM | POA: Diagnosis not present

## 2020-10-09 DIAGNOSIS — E559 Vitamin D deficiency, unspecified: Secondary | ICD-10-CM | POA: Diagnosis not present

## 2020-10-09 DIAGNOSIS — M810 Age-related osteoporosis without current pathological fracture: Secondary | ICD-10-CM | POA: Diagnosis not present

## 2020-10-10 DIAGNOSIS — M6281 Muscle weakness (generalized): Secondary | ICD-10-CM | POA: Diagnosis not present

## 2020-10-10 DIAGNOSIS — R262 Difficulty in walking, not elsewhere classified: Secondary | ICD-10-CM | POA: Diagnosis not present

## 2020-10-10 DIAGNOSIS — M818 Other osteoporosis without current pathological fracture: Secondary | ICD-10-CM | POA: Diagnosis not present

## 2020-10-12 DIAGNOSIS — Z1231 Encounter for screening mammogram for malignant neoplasm of breast: Secondary | ICD-10-CM | POA: Diagnosis not present

## 2020-10-12 DIAGNOSIS — R262 Difficulty in walking, not elsewhere classified: Secondary | ICD-10-CM | POA: Diagnosis not present

## 2020-10-12 DIAGNOSIS — M818 Other osteoporosis without current pathological fracture: Secondary | ICD-10-CM | POA: Diagnosis not present

## 2020-10-12 DIAGNOSIS — M6281 Muscle weakness (generalized): Secondary | ICD-10-CM | POA: Diagnosis not present

## 2020-10-22 DIAGNOSIS — M4120 Other idiopathic scoliosis, site unspecified: Secondary | ICD-10-CM | POA: Diagnosis not present

## 2020-10-22 DIAGNOSIS — M5416 Radiculopathy, lumbar region: Secondary | ICD-10-CM | POA: Diagnosis not present

## 2020-12-11 DIAGNOSIS — S82142D Displaced bicondylar fracture of left tibia, subsequent encounter for closed fracture with routine healing: Secondary | ICD-10-CM | POA: Diagnosis not present

## 2020-12-27 DIAGNOSIS — M5416 Radiculopathy, lumbar region: Secondary | ICD-10-CM | POA: Diagnosis not present

## 2021-01-31 DIAGNOSIS — M5416 Radiculopathy, lumbar region: Secondary | ICD-10-CM | POA: Diagnosis not present

## 2021-01-31 DIAGNOSIS — M4120 Other idiopathic scoliosis, site unspecified: Secondary | ICD-10-CM | POA: Diagnosis not present

## 2021-03-28 DIAGNOSIS — M5416 Radiculopathy, lumbar region: Secondary | ICD-10-CM | POA: Diagnosis not present

## 2021-04-30 DIAGNOSIS — M5416 Radiculopathy, lumbar region: Secondary | ICD-10-CM | POA: Diagnosis not present

## 2021-04-30 DIAGNOSIS — M4120 Other idiopathic scoliosis, site unspecified: Secondary | ICD-10-CM | POA: Diagnosis not present

## 2021-05-29 DIAGNOSIS — Z23 Encounter for immunization: Secondary | ICD-10-CM | POA: Diagnosis not present

## 2021-06-22 DIAGNOSIS — R0981 Nasal congestion: Secondary | ICD-10-CM | POA: Diagnosis not present

## 2021-06-22 DIAGNOSIS — J3489 Other specified disorders of nose and nasal sinuses: Secondary | ICD-10-CM | POA: Diagnosis not present

## 2021-06-22 DIAGNOSIS — R051 Acute cough: Secondary | ICD-10-CM | POA: Diagnosis not present

## 2021-06-22 DIAGNOSIS — U071 COVID-19: Secondary | ICD-10-CM | POA: Diagnosis not present

## 2021-06-22 DIAGNOSIS — J069 Acute upper respiratory infection, unspecified: Secondary | ICD-10-CM | POA: Diagnosis not present

## 2021-07-01 DIAGNOSIS — M5416 Radiculopathy, lumbar region: Secondary | ICD-10-CM | POA: Diagnosis not present

## 2021-07-23 ENCOUNTER — Other Ambulatory Visit: Payer: Self-pay | Admitting: Neurosurgery

## 2021-07-23 DIAGNOSIS — M5416 Radiculopathy, lumbar region: Secondary | ICD-10-CM | POA: Diagnosis not present

## 2021-07-23 DIAGNOSIS — R030 Elevated blood-pressure reading, without diagnosis of hypertension: Secondary | ICD-10-CM | POA: Diagnosis not present

## 2021-07-23 DIAGNOSIS — M4120 Other idiopathic scoliosis, site unspecified: Secondary | ICD-10-CM | POA: Diagnosis not present

## 2021-07-25 ENCOUNTER — Other Ambulatory Visit: Payer: Self-pay | Admitting: Neurosurgery

## 2021-07-25 DIAGNOSIS — M5416 Radiculopathy, lumbar region: Secondary | ICD-10-CM

## 2021-08-06 ENCOUNTER — Other Ambulatory Visit: Payer: Self-pay | Admitting: Neurosurgery

## 2021-08-06 DIAGNOSIS — M5416 Radiculopathy, lumbar region: Secondary | ICD-10-CM

## 2021-08-13 DIAGNOSIS — Z23 Encounter for immunization: Secondary | ICD-10-CM | POA: Diagnosis not present

## 2021-08-16 ENCOUNTER — Other Ambulatory Visit: Payer: Self-pay

## 2021-08-16 ENCOUNTER — Ambulatory Visit
Admission: RE | Admit: 2021-08-16 | Discharge: 2021-08-16 | Disposition: A | Discharge status: Home / Self Care | Payer: Medicare Other | Source: Ambulatory Visit | Attending: Neurosurgery | Admitting: Neurosurgery

## 2021-08-16 DIAGNOSIS — M48061 Spinal stenosis, lumbar region without neurogenic claudication: Secondary | ICD-10-CM | POA: Diagnosis not present

## 2021-08-16 DIAGNOSIS — M5416 Radiculopathy, lumbar region: Secondary | ICD-10-CM

## 2021-08-23 DIAGNOSIS — Z23 Encounter for immunization: Secondary | ICD-10-CM | POA: Diagnosis not present

## 2021-08-30 DIAGNOSIS — M533 Sacrococcygeal disorders, not elsewhere classified: Secondary | ICD-10-CM | POA: Diagnosis not present

## 2021-08-30 DIAGNOSIS — M5416 Radiculopathy, lumbar region: Secondary | ICD-10-CM | POA: Diagnosis not present

## 2021-08-30 DIAGNOSIS — M4120 Other idiopathic scoliosis, site unspecified: Secondary | ICD-10-CM | POA: Diagnosis not present

## 2021-10-03 DIAGNOSIS — M5416 Radiculopathy, lumbar region: Secondary | ICD-10-CM | POA: Diagnosis not present

## 2021-10-09 DIAGNOSIS — H5000 Unspecified esotropia: Secondary | ICD-10-CM | POA: Diagnosis not present

## 2021-10-09 DIAGNOSIS — Z961 Presence of intraocular lens: Secondary | ICD-10-CM | POA: Diagnosis not present

## 2021-10-09 DIAGNOSIS — H5213 Myopia, bilateral: Secondary | ICD-10-CM | POA: Diagnosis not present

## 2021-10-14 DIAGNOSIS — Z1231 Encounter for screening mammogram for malignant neoplasm of breast: Secondary | ICD-10-CM | POA: Diagnosis not present

## 2021-10-23 DIAGNOSIS — M5416 Radiculopathy, lumbar region: Secondary | ICD-10-CM | POA: Diagnosis not present

## 2021-10-23 DIAGNOSIS — M810 Age-related osteoporosis without current pathological fracture: Secondary | ICD-10-CM | POA: Diagnosis not present

## 2021-10-23 DIAGNOSIS — E7800 Pure hypercholesterolemia, unspecified: Secondary | ICD-10-CM | POA: Diagnosis not present

## 2021-10-23 DIAGNOSIS — I10 Essential (primary) hypertension: Secondary | ICD-10-CM | POA: Diagnosis not present

## 2021-10-23 DIAGNOSIS — Z79899 Other long term (current) drug therapy: Secondary | ICD-10-CM | POA: Diagnosis not present

## 2021-10-23 DIAGNOSIS — Z0000 Encounter for general adult medical examination without abnormal findings: Secondary | ICD-10-CM | POA: Diagnosis not present

## 2021-10-23 DIAGNOSIS — K219 Gastro-esophageal reflux disease without esophagitis: Secondary | ICD-10-CM | POA: Diagnosis not present

## 2021-10-23 DIAGNOSIS — E559 Vitamin D deficiency, unspecified: Secondary | ICD-10-CM | POA: Diagnosis not present

## 2021-11-06 DIAGNOSIS — M533 Sacrococcygeal disorders, not elsewhere classified: Secondary | ICD-10-CM | POA: Diagnosis not present

## 2021-11-06 DIAGNOSIS — M4120 Other idiopathic scoliosis, site unspecified: Secondary | ICD-10-CM | POA: Diagnosis not present

## 2021-11-07 DIAGNOSIS — M810 Age-related osteoporosis without current pathological fracture: Secondary | ICD-10-CM | POA: Diagnosis not present

## 2021-11-07 DIAGNOSIS — Z780 Asymptomatic menopausal state: Secondary | ICD-10-CM | POA: Diagnosis not present

## 2021-11-07 DIAGNOSIS — M85852 Other specified disorders of bone density and structure, left thigh: Secondary | ICD-10-CM | POA: Diagnosis not present

## 2021-12-26 DIAGNOSIS — H5005 Alternating esotropia: Secondary | ICD-10-CM | POA: Diagnosis not present

## 2021-12-26 DIAGNOSIS — H532 Diplopia: Secondary | ICD-10-CM | POA: Diagnosis not present

## 2021-12-26 DIAGNOSIS — Z9889 Other specified postprocedural states: Secondary | ICD-10-CM | POA: Diagnosis not present

## 2022-01-02 DIAGNOSIS — M5416 Radiculopathy, lumbar region: Secondary | ICD-10-CM | POA: Diagnosis not present

## 2022-01-15 DIAGNOSIS — K219 Gastro-esophageal reflux disease without esophagitis: Secondary | ICD-10-CM | POA: Diagnosis not present

## 2022-01-15 DIAGNOSIS — Z9049 Acquired absence of other specified parts of digestive tract: Secondary | ICD-10-CM | POA: Diagnosis not present

## 2022-01-15 DIAGNOSIS — H5005 Alternating esotropia: Secondary | ICD-10-CM | POA: Diagnosis not present

## 2022-01-15 DIAGNOSIS — Z79899 Other long term (current) drug therapy: Secondary | ICD-10-CM | POA: Diagnosis not present

## 2022-01-15 DIAGNOSIS — I10 Essential (primary) hypertension: Secondary | ICD-10-CM | POA: Diagnosis not present

## 2022-01-15 DIAGNOSIS — Z9889 Other specified postprocedural states: Secondary | ICD-10-CM | POA: Diagnosis not present

## 2022-01-15 DIAGNOSIS — H532 Diplopia: Secondary | ICD-10-CM | POA: Diagnosis not present

## 2022-01-29 DIAGNOSIS — E538 Deficiency of other specified B group vitamins: Secondary | ICD-10-CM | POA: Diagnosis not present

## 2022-03-21 DIAGNOSIS — H0011 Chalazion right upper eyelid: Secondary | ICD-10-CM | POA: Diagnosis not present

## 2022-04-07 ENCOUNTER — Telehealth: Payer: Self-pay | Admitting: Internal Medicine

## 2022-04-07 NOTE — Telephone Encounter (Signed)
Hi Dr. Henrene Pastor,  We received records on this patient requesting a transfer of care to you as she was a patient of Dr. Earlean Shawl, who is retiring.  Her records are being sent to you for your review.  Please advise if you approve the transfer of care and how you'd like to proceed.  Thank you.

## 2022-04-08 DIAGNOSIS — M5416 Radiculopathy, lumbar region: Secondary | ICD-10-CM | POA: Diagnosis not present

## 2022-04-08 NOTE — Telephone Encounter (Signed)
Okay to transfer care.

## 2022-04-09 ENCOUNTER — Encounter: Payer: Self-pay | Admitting: Internal Medicine

## 2022-05-08 DIAGNOSIS — M5416 Radiculopathy, lumbar region: Secondary | ICD-10-CM | POA: Diagnosis not present

## 2022-05-08 DIAGNOSIS — M4120 Other idiopathic scoliosis, site unspecified: Secondary | ICD-10-CM | POA: Diagnosis not present

## 2022-05-21 ENCOUNTER — Encounter: Payer: Self-pay | Admitting: Internal Medicine

## 2022-05-21 ENCOUNTER — Ambulatory Visit (INDEPENDENT_AMBULATORY_CARE_PROVIDER_SITE_OTHER): Payer: Medicare Other | Admitting: Internal Medicine

## 2022-05-21 VITALS — BP 122/70 | HR 100 | Ht <= 58 in | Wt 106.8 lb

## 2022-05-21 DIAGNOSIS — R159 Full incontinence of feces: Secondary | ICD-10-CM | POA: Diagnosis not present

## 2022-05-21 DIAGNOSIS — Z86010 Personal history of colonic polyps: Secondary | ICD-10-CM | POA: Diagnosis not present

## 2022-05-21 DIAGNOSIS — K219 Gastro-esophageal reflux disease without esophagitis: Secondary | ICD-10-CM

## 2022-05-21 NOTE — Patient Instructions (Signed)
If you are age 80 or older, your body mass index should be between 23-30. Your Body mass index is 22.32 kg/m. If this is out of the aforementioned range listed, please consider follow up with your Primary Care Provider.  If you are age 40 or younger, your body mass index should be between 19-25. Your Body mass index is 22.32 kg/m. If this is out of the aformentioned range listed, please consider follow up with your Primary Care Provider.   ________________________________________________________  The [facility] providers would like to encourage you to use [facility] to communicate with providers for non-urgent requests or questions.  Due to long hold times on the telephone, sending your provider a message by [facility] may be a faster and more efficient way to get a response.  Please allow 48 business hours for a response.  Please remember that this is for non-urgent requests.  _______________________________________________________  Take 1 tablespoon of Citrucel the first week and the increase to 2

## 2022-05-21 NOTE — Progress Notes (Signed)
HISTORY OF PRESENT ILLNESS:  Valerie Hull is a 81 y.o. female past medical history as listed below.  She presents today regarding fecal incontinence.  Previous patient of Dr. Richmond Hull until his retirement.  I reviewed the myriad of outside records.  Patient is an excellent historian.  She reports having had a hemorrhoidectomy remotely by Dr. Nicoletta Hull for issues with bleeding.  Procedure went well and she had no problems for many years.  She subsequently saw Dr. Earlean Hull for rectal bleeding.  She underwent complete colonoscopy December 06, 2018.  She was found to have a diminutive adenoma, sigmoid diverticulosis, and internal hemorrhoids.  She underwent as many as 10 banding sessions with the most frequent session dated April 13, 2019 after same day anoscopy to rule out other pathology.  Because of ongoing bleeding she was sent to colorectal surgeon Dr. Remo Lipps Hull who performed hemorrhoidectomy January 2021.  Since that surgery the patient has had incontinence of feces.  She reports seepage of feces without awareness on most days.  She also finds it difficult to clean post defecation.  She describes her bowel movements as daily and soft.  May have 1 or 2 bowel movements per day.  She does wear protective undergarments at all times.  She denies any pain or significant bleeding.  She is found that this problem significantly affects her lifestyle and ability to go out.  No nocturnal incontinence she also has a history of GERD which is controlled with PPI.  She is status post cholecystectomy  REVIEW OF SYSTEMS:  All non-GI ROS negative unless otherwise stated in HPI except for back pain, arthritis, sinus and allergy  Past Medical History:  Diagnosis Date   Acid reflux    Arthritis    Colon polyps    DJD (degenerative joint disease)    Hemorrhoids    History of hiatal hernia    Hypercholesteremia    IBS (irritable bowel syndrome)    Osteoporosis    PONV (postoperative nausea and vomiting)     Scoliosis     Past Surgical History:  Procedure Laterality Date   cataracts Bilateral    CHOLECYSTECTOMY     laparscopic   COLONOSCOPY  2020   DILATION AND CURETTAGE OF UTERUS     x2   EVALUATION UNDER ANESTHESIA WITH HEMORRHOIDECTOMY N/A 11/25/2019   Procedure: ANORECTAL EXAM UNDER ANESTHESIA WITH HEMORRHOIDECTOMY, HEMORRHOIDAL LIGATION/PEXY;  Surgeon: Michael Boston, MD;  Location: [facility];  Service: General;  Laterality: N/A;   eye lid surgery Bilateral    EYE SURGERY     age 79 both eyes muscle, sx at [facility] for seeing double 1 eye   HIP SURGERY Bilateral    left 1988, right done 1988   NASAL ENDOSCOPY     esophagus stretched   NASAL SEPTUM SURGERY     thumb surgery Left    joint replaced   WRIST SURGERY Left     Social History Valerie Hull  reports that she has never smoked. She has never used smokeless tobacco. She reports that she does not drink alcohol and does not use drugs.  family history includes Cancer in her mother; Colitis in her sister and sister; Colon cancer in her maternal aunt; Heart failure in her father; Rheum arthritis in her mother.  Allergies  Allergen Reactions   Ampicillin Hives and Nausea And Vomiting    Has patient had a PCN reaction causing immediate rash, facial/tongue/throat swelling, SOB or lightheadedness with hypotension: No Has  patient had a PCN reaction causing severe rash involving mucus membranes or skin necrosis: No Has patient had a PCN reaction that required hospitalization No Has patient had a PCN reaction occurring within the last 10 years: No If all of the above answers are "NO", then may proceed with Cephalosporin use.   Codeine Nausea And Vomiting   Crestor [Rosuvastatin] Other (See Comments)    Pain in joints   Lipitor [Atorvastatin]     elevated liver enzymes   Nitroglycerin Other (See Comments)    Blood pressure dropped with nitroglycerin cream   Sulfa Antibiotics Hives   Adhesive [Tape] Rash        PHYSICAL EXAMINATION: Vital signs: BP 122/70   Pulse 100   Ht '4\' 10"'  (1.473 m)   Wt 106 lb 12.8 oz (48.4 kg)   SpO2 (!) 79%   BMI 22.32 kg/m   Constitutional: generally well-appearing, no acute distress Psychiatric: alert and oriented x3, cooperative Eyes: extraocular movements intact, anicteric, conjunctiva pink Mouth: oral pharynx moist, no lesions Neck: supple no lymphadenopathy Cardiovascular: heart regular rate and rhythm, no murmur Lungs: clear to auscultation bilaterally Abdomen: soft, nontender, nondistended, no obvious ascites, no peritoneal signs, normal bowel sounds, no organomegaly Rectal: Omitted Extremities: no clubbing, cyanosis, or lower extremity edema bilaterally Skin: no lesions on visible extremities Neuro: No focal deficits.  Cranial nerves intact  ASSESSMENT:  1.  Chronic fecal incontinence post hemorrhoidectomy.  Patient certainly has a compromised sphincter given the timing and her lack of awareness regarding her incontinence. 2.  History of hemorrhoids status post remote hemorrhoidectomy, multiple banding procedures, and more recent hemorrhoidectomy as described 3.  GERD.  Symptoms controlled with PPI.  Continue PPI 4.  History of diminutive adenoma on colonoscopy 2020.  Aged out of surveillance   PLAN:  1.  Citrucel 2 tablespoons daily 2.  Schedule regular bathroom visits 3.  Continue with protective undergarments 4.  Office follow-up in 2 months Total time of 45 minutes was spent preparing to see the patient, reviewing myriad of outside records, reviewing prior procedure reports, obtaining comprehensive history, performing medically appropriate physical examination, counseling and educating patient regarding the above listed issues, directing medical therapy, arranging follow-up, and documenting clinical information in the health record

## 2022-07-14 DIAGNOSIS — M5416 Radiculopathy, lumbar region: Secondary | ICD-10-CM | POA: Diagnosis not present

## 2022-08-26 DIAGNOSIS — Z23 Encounter for immunization: Secondary | ICD-10-CM | POA: Diagnosis not present

## 2022-09-29 DIAGNOSIS — N763 Subacute and chronic vulvitis: Secondary | ICD-10-CM | POA: Diagnosis not present

## 2022-09-29 DIAGNOSIS — N898 Other specified noninflammatory disorders of vagina: Secondary | ICD-10-CM | POA: Diagnosis not present

## 2022-09-29 DIAGNOSIS — Z01419 Encounter for gynecological examination (general) (routine) without abnormal findings: Secondary | ICD-10-CM | POA: Diagnosis not present

## 2022-10-08 ENCOUNTER — Emergency Department (HOSPITAL_COMMUNITY): Payer: Medicare Other

## 2022-10-08 ENCOUNTER — Other Ambulatory Visit: Payer: Self-pay

## 2022-10-08 ENCOUNTER — Encounter (HOSPITAL_COMMUNITY): Payer: Self-pay | Admitting: *Deleted

## 2022-10-08 ENCOUNTER — Inpatient Hospital Stay (HOSPITAL_COMMUNITY)
Admission: EM | Admit: 2022-10-08 | Discharge: 2022-10-11 | DRG: 481 | Disposition: A | Discharge status: Skilled Nursing Facility | Payer: Medicare Other | Attending: Family Medicine | Admitting: Family Medicine

## 2022-10-08 ENCOUNTER — Inpatient Hospital Stay (HOSPITAL_COMMUNITY): Payer: Medicare Other

## 2022-10-08 ENCOUNTER — Emergency Department (HOSPITAL_COMMUNITY): Payer: Medicare Other | Admitting: Anesthesiology

## 2022-10-08 ENCOUNTER — Encounter (HOSPITAL_COMMUNITY)
Admission: EM | Disposition: A | Discharge status: Skilled Nursing Facility | Payer: Self-pay | Source: Home / Self Care | Attending: Family Medicine

## 2022-10-08 ENCOUNTER — Other Ambulatory Visit (HOSPITAL_COMMUNITY): Payer: Medicare Other

## 2022-10-08 DIAGNOSIS — M80031A Age-related osteoporosis with current pathological fracture, right forearm, initial encounter for fracture: Secondary | ICD-10-CM | POA: Diagnosis present

## 2022-10-08 DIAGNOSIS — Z800 Family history of malignant neoplasm of digestive organs: Secondary | ICD-10-CM

## 2022-10-08 DIAGNOSIS — S72301A Unspecified fracture of shaft of right femur, initial encounter for closed fracture: Principal | ICD-10-CM

## 2022-10-08 DIAGNOSIS — S6291XA Unspecified fracture of right wrist and hand, initial encounter for closed fracture: Secondary | ICD-10-CM | POA: Diagnosis not present

## 2022-10-08 DIAGNOSIS — Z91048 Other nonmedicinal substance allergy status: Secondary | ICD-10-CM

## 2022-10-08 DIAGNOSIS — K449 Diaphragmatic hernia without obstruction or gangrene: Secondary | ICD-10-CM | POA: Diagnosis not present

## 2022-10-08 DIAGNOSIS — Z888 Allergy status to other drugs, medicaments and biological substances status: Secondary | ICD-10-CM | POA: Diagnosis not present

## 2022-10-08 DIAGNOSIS — S62101A Fracture of unspecified carpal bone, right wrist, initial encounter for closed fracture: Secondary | ICD-10-CM

## 2022-10-08 DIAGNOSIS — E872 Acidosis, unspecified: Secondary | ICD-10-CM | POA: Diagnosis present

## 2022-10-08 DIAGNOSIS — E7800 Pure hypercholesterolemia, unspecified: Secondary | ICD-10-CM | POA: Diagnosis present

## 2022-10-08 DIAGNOSIS — Z882 Allergy status to sulfonamides status: Secondary | ICD-10-CM

## 2022-10-08 DIAGNOSIS — M810 Age-related osteoporosis without current pathological fracture: Secondary | ICD-10-CM | POA: Diagnosis not present

## 2022-10-08 DIAGNOSIS — K649 Unspecified hemorrhoids: Secondary | ICD-10-CM | POA: Diagnosis not present

## 2022-10-08 DIAGNOSIS — Z8261 Family history of arthritis: Secondary | ICD-10-CM | POA: Diagnosis not present

## 2022-10-08 DIAGNOSIS — K589 Irritable bowel syndrome without diarrhea: Secondary | ICD-10-CM | POA: Diagnosis not present

## 2022-10-08 DIAGNOSIS — G8929 Other chronic pain: Secondary | ICD-10-CM | POA: Diagnosis present

## 2022-10-08 DIAGNOSIS — Z8249 Family history of ischemic heart disease and other diseases of the circulatory system: Secondary | ICD-10-CM

## 2022-10-08 DIAGNOSIS — M4120 Other idiopathic scoliosis, site unspecified: Secondary | ICD-10-CM | POA: Diagnosis present

## 2022-10-08 DIAGNOSIS — Z7983 Long term (current) use of bisphosphonates: Secondary | ICD-10-CM

## 2022-10-08 DIAGNOSIS — M8000XD Age-related osteoporosis with current pathological fracture, unspecified site, subsequent encounter for fracture with routine healing: Secondary | ICD-10-CM | POA: Diagnosis not present

## 2022-10-08 DIAGNOSIS — Z880 Allergy status to penicillin: Secondary | ICD-10-CM

## 2022-10-08 DIAGNOSIS — S7290XA Unspecified fracture of unspecified femur, initial encounter for closed fracture: Secondary | ICD-10-CM | POA: Diagnosis present

## 2022-10-08 DIAGNOSIS — M1990 Unspecified osteoarthritis, unspecified site: Secondary | ICD-10-CM

## 2022-10-08 DIAGNOSIS — W19XXXD Unspecified fall, subsequent encounter: Secondary | ICD-10-CM | POA: Diagnosis not present

## 2022-10-08 DIAGNOSIS — S72301D Unspecified fracture of shaft of right femur, subsequent encounter for closed fracture with routine healing: Secondary | ICD-10-CM | POA: Diagnosis not present

## 2022-10-08 DIAGNOSIS — E876 Hypokalemia: Secondary | ICD-10-CM | POA: Diagnosis present

## 2022-10-08 DIAGNOSIS — I10 Essential (primary) hypertension: Secondary | ICD-10-CM | POA: Diagnosis present

## 2022-10-08 DIAGNOSIS — M7989 Other specified soft tissue disorders: Secondary | ICD-10-CM | POA: Diagnosis not present

## 2022-10-08 DIAGNOSIS — S72331A Displaced oblique fracture of shaft of right femur, initial encounter for closed fracture: Secondary | ICD-10-CM | POA: Diagnosis not present

## 2022-10-08 DIAGNOSIS — W19XXXA Unspecified fall, initial encounter: Secondary | ICD-10-CM | POA: Diagnosis not present

## 2022-10-08 DIAGNOSIS — S52501A Unspecified fracture of the lower end of right radius, initial encounter for closed fracture: Secondary | ICD-10-CM | POA: Diagnosis not present

## 2022-10-08 DIAGNOSIS — S7221XA Displaced subtrochanteric fracture of right femur, initial encounter for closed fracture: Secondary | ICD-10-CM | POA: Diagnosis not present

## 2022-10-08 DIAGNOSIS — D649 Anemia, unspecified: Secondary | ICD-10-CM | POA: Diagnosis not present

## 2022-10-08 DIAGNOSIS — Z885 Allergy status to narcotic agent status: Secondary | ICD-10-CM

## 2022-10-08 DIAGNOSIS — W1830XA Fall on same level, unspecified, initial encounter: Secondary | ICD-10-CM | POA: Diagnosis present

## 2022-10-08 DIAGNOSIS — S3993XA Unspecified injury of pelvis, initial encounter: Secondary | ICD-10-CM | POA: Diagnosis not present

## 2022-10-08 DIAGNOSIS — M80051A Age-related osteoporosis with current pathological fracture, right femur, initial encounter for fracture: Secondary | ICD-10-CM | POA: Diagnosis present

## 2022-10-08 DIAGNOSIS — M419 Scoliosis, unspecified: Secondary | ICD-10-CM | POA: Diagnosis present

## 2022-10-08 DIAGNOSIS — T1490XA Injury, unspecified, initial encounter: Secondary | ICD-10-CM | POA: Diagnosis not present

## 2022-10-08 DIAGNOSIS — M549 Dorsalgia, unspecified: Secondary | ICD-10-CM | POA: Insufficient documentation

## 2022-10-08 DIAGNOSIS — Z043 Encounter for examination and observation following other accident: Secondary | ICD-10-CM | POA: Diagnosis not present

## 2022-10-08 DIAGNOSIS — Z79899 Other long term (current) drug therapy: Secondary | ICD-10-CM

## 2022-10-08 DIAGNOSIS — E538 Deficiency of other specified B group vitamins: Secondary | ICD-10-CM | POA: Diagnosis not present

## 2022-10-08 DIAGNOSIS — S79929A Unspecified injury of unspecified thigh, initial encounter: Secondary | ICD-10-CM | POA: Diagnosis not present

## 2022-10-08 DIAGNOSIS — M519 Unspecified thoracic, thoracolumbar and lumbosacral intervertebral disc disorder: Secondary | ICD-10-CM | POA: Diagnosis not present

## 2022-10-08 DIAGNOSIS — K219 Gastro-esophageal reflux disease without esophagitis: Secondary | ICD-10-CM | POA: Diagnosis not present

## 2022-10-08 DIAGNOSIS — Y92009 Unspecified place in unspecified non-institutional (private) residence as the place of occurrence of the external cause: Secondary | ICD-10-CM | POA: Diagnosis not present

## 2022-10-08 DIAGNOSIS — R531 Weakness: Secondary | ICD-10-CM | POA: Diagnosis not present

## 2022-10-08 DIAGNOSIS — G4700 Insomnia, unspecified: Secondary | ICD-10-CM | POA: Diagnosis not present

## 2022-10-08 DIAGNOSIS — Z9181 History of falling: Secondary | ICD-10-CM | POA: Diagnosis not present

## 2022-10-08 DIAGNOSIS — M47812 Spondylosis without myelopathy or radiculopathy, cervical region: Secondary | ICD-10-CM | POA: Diagnosis not present

## 2022-10-08 DIAGNOSIS — M545 Low back pain, unspecified: Secondary | ICD-10-CM | POA: Diagnosis not present

## 2022-10-08 DIAGNOSIS — S52601A Unspecified fracture of lower end of right ulna, initial encounter for closed fracture: Secondary | ICD-10-CM | POA: Diagnosis not present

## 2022-10-08 DIAGNOSIS — S62101D Fracture of unspecified carpal bone, right wrist, subsequent encounter for fracture with routine healing: Secondary | ICD-10-CM | POA: Diagnosis not present

## 2022-10-08 DIAGNOSIS — Z7401 Bed confinement status: Secondary | ICD-10-CM | POA: Diagnosis not present

## 2022-10-08 HISTORY — PX: FEMUR IM NAIL: SHX1597

## 2022-10-08 LAB — I-STAT CHEM 8, ED
BUN: 14 mg/dL (ref 8–23)
Calcium, Ion: 1.16 mmol/L (ref 1.15–1.40)
Chloride: 105 mmol/L (ref 98–111)
Creatinine, Ser: 0.5 mg/dL (ref 0.44–1.00)
Glucose, Bld: 114 mg/dL — ABNORMAL HIGH (ref 70–99)
HCT: 42 % (ref 36.0–46.0)
Hemoglobin: 14.3 g/dL (ref 12.0–15.0)
Potassium: 3 mmol/L — ABNORMAL LOW (ref 3.5–5.1)
Sodium: 141 mmol/L (ref 135–145)
TCO2: 22 mmol/L (ref 22–32)

## 2022-10-08 LAB — COMPREHENSIVE METABOLIC PANEL
ALT: 11 U/L (ref 0–44)
AST: 23 U/L (ref 15–41)
Albumin: 3.9 g/dL (ref 3.5–5.0)
Alkaline Phosphatase: 62 U/L (ref 38–126)
Anion gap: 11 (ref 5–15)
BUN: 13 mg/dL (ref 8–23)
CO2: 21 mmol/L — ABNORMAL LOW (ref 22–32)
Calcium: 9.1 mg/dL (ref 8.9–10.3)
Chloride: 106 mmol/L (ref 98–111)
Creatinine, Ser: 0.68 mg/dL (ref 0.44–1.00)
GFR, Estimated: >60 mL/min (ref >60)
Glucose, Bld: 115 mg/dL — ABNORMAL HIGH (ref 70–99)
Potassium: 2.9 mmol/L — ABNORMAL LOW (ref 3.5–5.1)
Sodium: 138 mmol/L (ref 135–145)
Total Bilirubin: 0.8 mg/dL (ref 0.3–1.2)
Total Protein: 7.5 g/dL (ref 6.5–8.1)

## 2022-10-08 LAB — CBC
HCT: 42.8 % (ref 36.0–46.0)
Hemoglobin: 13.4 g/dL (ref 12.0–15.0)
MCH: 27.5 pg (ref 26.0–34.0)
MCHC: 31.3 g/dL (ref 30.0–36.0)
MCV: 87.7 fL (ref 80.0–100.0)
Platelets: 202 10*3/uL (ref 150–400)
RBC: 4.88 MIL/uL (ref 3.87–5.11)
RDW: 14.6 % (ref 11.5–15.5)
WBC: 11.5 10*3/uL — ABNORMAL HIGH (ref 4.0–10.5)
nRBC: 0 % (ref 0.0–0.2)

## 2022-10-08 LAB — ETHANOL: Alcohol, Ethyl (B): <10 mg/dL (ref <10)

## 2022-10-08 LAB — PROTIME-INR
INR: 1.1 (ref 0.8–1.2)
Prothrombin Time: 13.8 seconds (ref 11.4–15.2)

## 2022-10-08 LAB — LACTIC ACID, PLASMA: Lactic Acid, Venous: 2.1 mmol/L (ref 0.5–1.9)

## 2022-10-08 SURGERY — INSERTION, INTRAMEDULLARY ROD, FEMUR
Anesthesia: General | Laterality: Right

## 2022-10-08 MED ORDER — 0.9 % SODIUM CHLORIDE (POUR BTL) OPTIME
TOPICAL | Status: DC | PRN
  Administered 2022-10-08: 1000 mL

## 2022-10-08 MED ORDER — ONDANSETRON HCL 4 MG/2ML IJ SOLN
4.0000 mg | Freq: Once | INTRAMUSCULAR | Status: AC
  Administered 2022-10-08: 4 mg via INTRAVENOUS
  Filled 2022-10-08: qty 2

## 2022-10-08 MED ORDER — PHENYLEPHRINE 80 MCG/ML (10ML) SYRINGE FOR IV PUSH (FOR BLOOD PRESSURE SUPPORT)
PREFILLED_SYRINGE | INTRAVENOUS | Status: DC | PRN
  Administered 2022-10-08 (×2): 160 ug via INTRAVENOUS

## 2022-10-08 MED ORDER — ENOXAPARIN SODIUM 40 MG/0.4ML IJ SOSY: 40.0000 mg | PREFILLED_SYRINGE | INTRAMUSCULAR | 0 refills | Status: DC

## 2022-10-08 MED ORDER — ETOMIDATE 2 MG/ML IV SOLN: INTRAVENOUS | Status: AC | PRN

## 2022-10-08 MED ORDER — ACETAMINOPHEN 10 MG/ML IV SOLN
INTRAVENOUS | Status: DC | PRN
  Administered 2022-10-08: 1000 mg via INTRAVENOUS

## 2022-10-08 MED ORDER — CHLORHEXIDINE GLUCONATE 0.12 % MT SOLN: 15.0000 mL | Freq: Once | OROMUCOSAL | Status: AC

## 2022-10-08 MED ORDER — POVIDONE-IODINE 10 % EX SWAB
2.0000 | Freq: Once | CUTANEOUS | Status: AC
  Administered 2022-10-08: 2 via TOPICAL

## 2022-10-08 MED ORDER — PHENYLEPHRINE HCL-NACL 20-0.9 MG/250ML-% IV SOLN
INTRAVENOUS | Status: DC | PRN
  Administered 2022-10-08: 25 ug/min via INTRAVENOUS

## 2022-10-08 MED ORDER — TRANEXAMIC ACID-NACL 1000-0.7 MG/100ML-% IV SOLN
INTRAVENOUS | Status: DC | PRN
  Administered 2022-10-08: 1000 mg via INTRAVENOUS

## 2022-10-08 MED ORDER — ORAL CARE MOUTH RINSE: 15.0000 mL | Freq: Once | OROMUCOSAL | Status: AC

## 2022-10-08 MED ORDER — SUCCINYLCHOLINE 20MG/ML (10ML) SYRINGE FOR MEDFUSION PUMP - OPTIME
INTRAMUSCULAR | Status: DC | PRN
  Administered 2022-10-08: 80 mg via INTRAVENOUS

## 2022-10-08 MED ORDER — LIDOCAINE 2% (20 MG/ML) 5 ML SYRINGE
INTRAMUSCULAR | Status: DC | PRN
  Administered 2022-10-08: 60 mg via INTRAVENOUS

## 2022-10-08 MED ORDER — AMISULPRIDE (ANTIEMETIC) 5 MG/2ML IV SOLN: 10.0000 mg | Freq: Once | INTRAVENOUS | Status: DC | PRN

## 2022-10-08 MED ORDER — ACETAMINOPHEN 10 MG/ML IV SOLN
INTRAVENOUS | Status: AC
  Filled 2022-10-08: qty 100

## 2022-10-08 MED ORDER — CEFAZOLIN SODIUM-DEXTROSE 2-4 GM/100ML-% IV SOLN
INTRAVENOUS | Status: AC
  Filled 2022-10-08: qty 100

## 2022-10-08 MED ORDER — POTASSIUM CHLORIDE 10 MEQ/100ML IV SOLN
10.0000 meq | Freq: Once | INTRAVENOUS | Status: AC
  Administered 2022-10-08: 10 meq via INTRAVENOUS
  Filled 2022-10-08: qty 100

## 2022-10-08 MED ORDER — FENTANYL CITRATE (PF) 100 MCG/2ML IJ SOLN
INTRAMUSCULAR | Status: AC
  Filled 2022-10-08: qty 2

## 2022-10-08 MED ORDER — TRANEXAMIC ACID-NACL 1000-0.7 MG/100ML-% IV SOLN
INTRAVENOUS | Status: AC
  Filled 2022-10-08: qty 100

## 2022-10-08 MED ORDER — POTASSIUM CHLORIDE CRYS ER 20 MEQ PO TBCR
40.0000 meq | EXTENDED_RELEASE_TABLET | Freq: Once | ORAL | Status: DC
  Filled 2022-10-08: qty 2

## 2022-10-08 MED ORDER — DEXAMETHASONE SODIUM PHOSPHATE 10 MG/ML IJ SOLN
INTRAMUSCULAR | Status: DC | PRN
  Administered 2022-10-08: 5 mg via INTRAVENOUS

## 2022-10-08 MED ORDER — VANCOMYCIN HCL 1000 MG IV SOLR
INTRAVENOUS | Status: DC | PRN
  Administered 2022-10-08: 1000 mg via TOPICAL

## 2022-10-08 MED ORDER — FENTANYL CITRATE PF 50 MCG/ML IJ SOSY
25.0000 ug | PREFILLED_SYRINGE | Freq: Once | INTRAMUSCULAR | Status: AC
  Administered 2022-10-08: 25 ug via INTRAVENOUS
  Filled 2022-10-08: qty 1

## 2022-10-08 MED ORDER — PROPOFOL 500 MG/50ML IV EMUL: INTRAVENOUS | Status: DC | PRN

## 2022-10-08 MED ORDER — AMISULPRIDE (ANTIEMETIC) 5 MG/2ML IV SOLN
10.0000 mg | Freq: Once | INTRAVENOUS | Status: AC
  Administered 2022-10-08: 10 mg via INTRAVENOUS

## 2022-10-08 MED ORDER — FENTANYL CITRATE (PF) 250 MCG/5ML IJ SOLN
INTRAMUSCULAR | Status: DC | PRN
  Administered 2022-10-08 (×3): 50 ug via INTRAVENOUS

## 2022-10-08 MED ORDER — ROCURONIUM BROMIDE 10 MG/ML (PF) SYRINGE
PREFILLED_SYRINGE | INTRAVENOUS | Status: DC | PRN
  Administered 2022-10-08: 40 mg via INTRAVENOUS

## 2022-10-08 MED ORDER — PROPOFOL 10 MG/ML IV BOLUS
INTRAVENOUS | Status: DC | PRN
  Administered 2022-10-08: 100 mg via INTRAVENOUS
  Administered 2022-10-08: 125 ug/kg/min via INTRAVENOUS

## 2022-10-08 MED ORDER — LACTATED RINGERS IV SOLN: INTRAVENOUS | Status: DC

## 2022-10-08 MED ORDER — CHLORHEXIDINE GLUCONATE 4 % EX LIQD: 60.0000 mL | Freq: Once | CUTANEOUS | Status: DC

## 2022-10-08 MED ORDER — HYDROCODONE-ACETAMINOPHEN 5-325 MG PO TABS: 1.0000 | ORAL_TABLET | ORAL | 0 refills | Status: DC | PRN

## 2022-10-08 MED ORDER — AMISULPRIDE (ANTIEMETIC) 5 MG/2ML IV SOLN
INTRAVENOUS | Status: AC
  Filled 2022-10-08: qty 4

## 2022-10-08 MED ORDER — SUCCINYLCHOLINE CHLORIDE 20 MG/ML IJ SOLN: INTRAMUSCULAR | Status: AC | PRN

## 2022-10-08 MED ORDER — CHLORHEXIDINE GLUCONATE 0.12 % MT SOLN
OROMUCOSAL | Status: AC
  Administered 2022-10-08: 15 mL via OROMUCOSAL
  Filled 2022-10-08: qty 15

## 2022-10-08 MED ORDER — ONDANSETRON HCL 4 MG/2ML IJ SOLN
INTRAMUSCULAR | Status: DC | PRN
  Administered 2022-10-08: 4 mg via INTRAVENOUS

## 2022-10-08 MED ORDER — FENTANYL CITRATE (PF) 250 MCG/5ML IJ SOLN
INTRAMUSCULAR | Status: AC
  Filled 2022-10-08: qty 5

## 2022-10-08 MED ORDER — CEFAZOLIN SODIUM-DEXTROSE 2-4 GM/100ML-% IV SOLN
2.0000 g | INTRAVENOUS | Status: AC
  Administered 2022-10-08: 2 g via INTRAVENOUS

## 2022-10-08 MED ORDER — VANCOMYCIN HCL 1000 MG IV SOLR
INTRAVENOUS | Status: AC
  Filled 2022-10-08: qty 20

## 2022-10-08 MED ORDER — FENTANYL CITRATE (PF) 100 MCG/2ML IJ SOLN
25.0000 ug | INTRAMUSCULAR | Status: DC | PRN
  Administered 2022-10-08 (×3): 50 ug via INTRAVENOUS

## 2022-10-08 SURGICAL SUPPLY — 50 items
BAG COUNTER SPONGE SURGICOUNT (BAG) ×1 IMPLANT
BIT DRILL CANN 16 HIP (BIT) IMPLANT
BIT DRILL CANN STP 6/9 HIP (BIT) IMPLANT
BIT DRILL SHORT 4.2 (BIT) IMPLANT
BNDG COHESIVE 6X5 TAN NS LF (GAUZE/BANDAGES/DRESSINGS) ×1 IMPLANT
CHLORAPREP W/TINT 26 (MISCELLANEOUS) ×1 IMPLANT
COVER PERINEAL POST (MISCELLANEOUS) ×1 IMPLANT
COVER SURGICAL LIGHT HANDLE (MISCELLANEOUS) ×1 IMPLANT
DERMABOND ADVANCED .7 DNX12 (GAUZE/BANDAGES/DRESSINGS) ×2 IMPLANT
DERMABOND ADVANCED .7 DNX6 (GAUZE/BANDAGES/DRESSINGS) IMPLANT
DRAPE C-ARM 42X72 X-RAY (DRAPES) ×1 IMPLANT
DRAPE C-ARMOR (DRAPES) ×1 IMPLANT
DRAPE STERI IOBAN 125X83 (DRAPES) ×1 IMPLANT
DRESSING AQUACEL AG SP 3.5X6 (GAUZE/BANDAGES/DRESSINGS) IMPLANT
DRILL BIT SHORT 4.2 (BIT) ×2
DRSG AQUACEL AG SP 3.5X6 (GAUZE/BANDAGES/DRESSINGS) ×3
DRSG MEPILEX BORDER 4X8 (GAUZE/BANDAGES/DRESSINGS) ×2 IMPLANT
ELECT REM PT RETURN 9FT ADLT (ELECTROSURGICAL) ×1
ELECTRODE REM PT RTRN 9FT ADLT (ELECTROSURGICAL) ×1 IMPLANT
GLOVE BIOGEL PI IND STRL 8 (GLOVE) ×2 IMPLANT
GLOVE ECLIPSE 7.5 STRL STRAW (GLOVE) ×2 IMPLANT
GLOVE SURG ORTHO LTX SZ7.5 (GLOVE) ×2 IMPLANT
GOWN STRL REIN XL XLG (GOWN DISPOSABLE) ×1 IMPLANT
GOWN STRL REUS W/ TWL LRG LVL3 (GOWN DISPOSABLE) ×1 IMPLANT
GOWN STRL REUS W/ TWL XL LVL3 (GOWN DISPOSABLE) ×1 IMPLANT
GOWN STRL REUS W/TWL LRG LVL3 (GOWN DISPOSABLE) ×2
GOWN STRL REUS W/TWL XL LVL3 (GOWN DISPOSABLE) ×1
GUIDEWIRE 3.2X400 (WIRE) IMPLANT
KIT BASIN OR (CUSTOM PROCEDURE TRAY) ×1 IMPLANT
MANIFOLD NEPTUNE II (INSTRUMENTS) ×1 IMPLANT
NAIL TI CANN 11MM 130D HIP (Nail) IMPLANT
NS IRRIG 1000ML POUR BTL (IV SOLUTION) ×1 IMPLANT
PACK GENERAL/GYN (CUSTOM PROCEDURE TRAY) ×1 IMPLANT
PAD ARMBOARD 7.5X6 YLW CONV (MISCELLANEOUS) ×2 IMPLANT
REAMER ROD DEEP FLUTE 2.5X950 (INSTRUMENTS) IMPLANT
SCREW FENS TFNA 85 (Screw) IMPLANT
SCREW LOCK IM 5X36 (Screw) ×1 IMPLANT
SCREW LOCK IM NL 5X38 (Screw) IMPLANT
SCREW LOCK X25 36X5X IM (Screw) IMPLANT
SPONGE T-LAP 18X18 ~~LOC~~+RFID (SPONGE) ×2 IMPLANT
SUT MNCRL AB 3-0 PS2 18 (SUTURE) ×2 IMPLANT
SUT MNCRL+ AB 3-0 CT1 36 (SUTURE) IMPLANT
SUT MON AB 2-0 CT1 36 (SUTURE) ×2 IMPLANT
SUT MONOCRYL AB 3-0 CT1 36IN (SUTURE) ×1
SUT PDS AB 1 CT  36 (SUTURE) ×4
SUT PDS AB 1 CT 36 (SUTURE) ×2 IMPLANT
SUT VIC AB 0 CT1 27 (SUTURE) ×2
SUT VIC AB 0 CT1 27XBRD ANBCTR (SUTURE) ×2 IMPLANT
TAPE STRIPS DRAPE STRL (GAUZE/BANDAGES/DRESSINGS) IMPLANT
TOWEL GREEN STERILE (TOWEL DISPOSABLE) ×2 IMPLANT

## 2022-10-08 NOTE — ED Notes (Signed)
RN paged Dr. Mable Fill about pt being delayed for CT neck. Dr. Mable Fill advises to leave c-collar on and get her CT scan post surgery.

## 2022-10-08 NOTE — Consult Note (Signed)
Orthopaedic Consult  Date/Time: 10/08/22 7:40 PM  Patient Name: Valerie Hull  Attending Physician: No att. providers found    ASSESSMENT & PLAN  Orthopaedic Assessment: 81 y.o. female with right femoral shaft fracture and right nondisplaced impacted distal radius distal radius fracture (S52.501A).  Plan: My recommendation for treatment of the right nondisplaced impacted distal radius fracture (S52.501A) is closed treatment without manipulation (25600) with a brief period of immobilization in a sugar-tong splint, will transition to a short arm hard cast follow-up.  Explained to the patient that she has sustained a right femur fracture.  Both nonoperative and operative treatment options were discussed.  Nonoperative treatment would consist of prolonged immobilization with a delayed return to weightbearing.  This is associated with significant morbidity, including increased risk of pneumonia, UTI, pressure ulcers, DVT/VTE.  Given the patient's previous level of mobility and functional status, as well as desire to avoid complications and morbidity of nonoperative treatment, she/the family have elected to proceed with operative treatment with reduction and intramedullary nail fixation.  Risks of the proposed surgical treatment were discussed with the patient/family, including bleeding, wound healing complications, infection, damage to surrounding structures, persistent pain, stiffness, lack of improvement, potential for subsequent arthritis or worsening of pre-existing arthritis, nonunion, malunion, and need for further surgery, as well as complications related to anesthesia, cardiovascular complications, and death.  All questions were answered to the satisfaction of the patient/family.  She/the family understand(s) all of this and wishes to proceed with surgery.     Georgeanna Harrison M.D. Orthopaedic Surgery [facility]   Medical Decision Making  Amount/complexity of  data: Is there a current pathologic fracture (e.g. neoplastic, osteoporotic insufficiency fracture)? Yes Independent interpretation of radiographic studies: Yes Review of radiology results (e.g. reports): Yes Tests ordered (e.g. additional radiographic studies, labs): Yes Lab results reviewed: Yes Reviewed old records: Yes History from another source (independent historian, e.g. family/friend/etc.): No Discussion of imaging, clinical data, and or management with independent medical provider: Yes Risk: Patient receiving IV controlled substances for pain: Yes Fracture requiring manipulation: No Urgent or emergent (non-elective) surgery likely this admission: Yes Presence of medical comorbidities and/or surgical risk factors (e.g. current smoker, CAD, diabetes, COPD, CKD, etc.): Yes Closed fracture management WITHOUT manipulation: Yes Urgent minor procedure (e.g. joint aspiration, compartment pressure measurement, etc.): No Will likely need surgery as an outpatient: No     HPI Valerie Hull is a 81 y.o. female. Orthopaedic consultation has specifically been requested to address this patient's current musculoskeletal presentation. She sustained a fall and had immediate pain in the right hip/thigh.  Pain is sharp and severe.  Pain is worse movement and better with rest and immobilization.  Since the fall the patient was not able to ambulate or bear weight due to pain.  At baseline the patient is ambulatory with no assistive devices.  She denied antecedent pain in the thigh before the injury.  No malignant or metastatic history.  She also reported pain in the right wrist which she believes she is to brace her fall.   PMH Past Medical History:  Diagnosis Date   Acid reflux    Arthritis    Colon polyps    DJD (degenerative joint disease)    Hemorrhoids    History of hiatal hernia    Hypercholesteremia    IBS (irritable bowel syndrome)    Osteoporosis    PONV (postoperative nausea and  vomiting)    Scoliosis      PSH Past  Surgical History:  Procedure Laterality Date   cataracts Bilateral    CHOLECYSTECTOMY     laparscopic   COLONOSCOPY  2020   DILATION AND CURETTAGE OF UTERUS     x2   EVALUATION UNDER ANESTHESIA WITH HEMORRHOIDECTOMY N/A 11/25/2019   Procedure: ANORECTAL EXAM UNDER ANESTHESIA WITH HEMORRHOIDECTOMY, HEMORRHOIDAL LIGATION/PEXY;  Surgeon: Michael Boston, MD;  Location: [place];  Service: General;  Laterality: N/A;   eye lid surgery Bilateral    EYE SURGERY     age 89 both eyes muscle, sx at [facility] for seeing double 1 eye   HIP SURGERY Bilateral    left 1988, right done 1988   NASAL ENDOSCOPY     esophagus stretched   NASAL SEPTUM SURGERY     thumb surgery Left    joint replaced   WRIST SURGERY Left    Home Medications Prior to Admission medications   Medication Sig Start Date End Date Taking? Authorizing Provider  Calcium Citrate-Vitamin D (CALCIUM + D PO) Take 1 tablet by mouth 4 (four) times daily.  Patient not taking: Reported on 05/21/2022    [provider]  diphenhydramine-acetaminophen (TYLENOL PM) 25-500 MG TABS tablet Take 1 tablet by mouth at bedtime as needed (pain/sleep).    [provider]  gabapentin (NEURONTIN) 300 MG capsule Take 300 mg by mouth 2 (two) times daily.    [provider]  Melatonin 5 MG CAPS Take by mouth at bedtime as needed.    [provider]  Multiple Vitamins-Minerals (CENTRUM SILVER ULTRA WOMENS PO) Take by mouth daily.    [provider]  omeprazole (PRILOSEC) 40 MG capsule Take 40 mg by mouth daily.    [provider]  oxyCODONE (OXY IR/ROXICODONE) 5 MG immediate release tablet Take 1-2 tablets (5-10 mg total) by mouth every 6 (six) hours as needed for moderate pain, severe pain or breakthrough pain. 11/25/19   Michael Boston, MD  Turmeric (QC TUMERIC COMPLEX PO) Take by mouth. 1000 mg daily    [provider]  UNABLE TO FIND Vitamin  d 3 50 mg tid    [provider]     Allergies Allergies  Allergen Reactions   Ampicillin Hives and Nausea And Vomiting    Has patient had a PCN reaction causing immediate rash, facial/tongue/throat swelling, SOB or lightheadedness with hypotension: No Has patient had a PCN reaction causing severe rash involving mucus membranes or skin necrosis: No Has patient had a PCN reaction that required hospitalization No Has patient had a PCN reaction occurring within the last 10 years: No If all of the above answers are "NO", then may proceed with Cephalosporin use.   Codeine Nausea And Vomiting   Crestor [Rosuvastatin] Other (See Comments)    Pain in joints   Lipitor [Atorvastatin]     elevated liver enzymes   Nitroglycerin Other (See Comments)    Blood pressure dropped with nitroglycerin cream   Sulfa Antibiotics Hives   Adhesive [Tape] Rash     Family History Family History  Problem Relation Age of Onset   Rheum arthritis Mother    Cancer Mother    Heart failure Father    Colitis Sister    Colitis Sister    Colon cancer Maternal Aunt    Colon polyps Neg Hx    Stomach cancer Neg Hx    Esophageal cancer Neg Hx     Social History Social History   Socioeconomic History   Marital status: Single  Spouse name: Not on file   Number of children: 0   Years of education: Not on file   Highest education level: Not on file  Occupational History   Not on file  Tobacco Use   Smoking status: Never   Smokeless tobacco: Never  Vaping Use   Vaping Use: Never used  Substance and Sexual Activity   Alcohol use: No   Drug use: No   Sexual activity: Not on file  Other Topics Concern   Not on file  Social History Narrative   ** Merged History Encounter **       Social Determinants of Health   Financial Resource Strain: Not on file  Food Insecurity: Not on file  Transportation Needs: Not on file  Physical Activity: Not on file  Stress: Not on file  Social Connections:  Not on file  Intimate Partner Violence: Not on file     Review of Systems MSK: As noted per HPI above GI: No current Nausea/vomiting ENT: Denies sore throat, epistaxis CV: Denies chest pain  Resp: No current shortness of breath  Other than mentioned above, there are no Constitutional, Neurological, Psychiatric, ENT, Ophthalmological, Cardiovascular, Respiratory, GI, GU, Musculoskeletal, Integumentary, Lymphatic, Endocrine or Allergic issues.     Imaging  Independent interpretation of orthopaedic-relevant films: Multiple views of the right wrist demonstrate impacted nondisplaced right distal radius fracture.  There is also widening of the scapholunate interval. 2 views of the right femur demonstrate relatively transverse femoral shaft fracture.  Radiographic results: DG Wrist Complete Right  Result Date: 10/08/2022 CLINICAL DATA:  Trauma EXAM: RIGHT WRIST - COMPLETE 3+ VIEW COMPARISON:  None Available. FINDINGS: Frontal, oblique, lateral views of the right wrist are obtained. There is an impacted fracture of the distal right radial metaphysis, with slight dorsal angulation at the fracture site. I do not see any definite intra-articular extension. There is also an impacted distal right ulnar metaphyseal fracture, with grossly normal anatomic alignment. Widening of the scapholunate interval is identified measuring up to 5 mm, which could reflect acute ligamentous injury, sequela from previous trauma, or chronic degenerative change. Mild proximal migration of the capitate suggests more chronic injury. The remainder of the right wrist is unremarkable. Diffuse soft tissue swelling is noted. IMPRESSION: 1. Acute impacted distal right radial and ulnar metaphyseal fractures as above. 2. Widened scapholunate interval, mild proximal migration of the capitate. Favor sequela of chronic or degenerative change rather than acute injury. If there is focal tenderness at the anatomic snuff box, ulnar deviated  views may be useful to better assess the scaphoid. 3. Diffuse soft tissue swelling. Electronically Signed   By: Randa Ngo M.D.   On: 10/08/2022 15:43   DG Chest Port 1 View  Result Date: 10/08/2022 CLINICAL DATA:  Trauma EXAM: PORTABLE CHEST 1 VIEW COMPARISON:  None Available. FINDINGS: No pleural effusion. No pneumothorax. No focal airspace opacity. No radiographically apparent displaced rib fracture. Normal cardiac and mediastinal contours. 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 Surgical clips in the right upper quadrant. IMPRESSION: 1. No active disease. 2. 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 Electronically Signed   By: Marin Roberts M.D.   On: 10/08/2022 15:41   DG Pelvis Portable  Result Date: 10/08/2022 CLINICAL DATA:  Trauma EXAM: PORTABLE PELVIS 1-2 VIEWS COMPARISON:  07/23/2021 FINDINGS: Two frontal views of the pelvis are obtained. Portions of the right iliac crest are excluded by collimation. There is a transverse fracture through the proximal right femoral diaphysis with varus angulation  at the fracture site. There are no other acute displaced pelvic fractures. 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 Sacroiliac joints are unremarkable. Soft tissue swelling proximal right thigh. IMPRESSION: 1. Displaced proximal right femoral diaphyseal fracture, with varus angulation. 2. No other acute pelvic fractures. 3. Bilateral hip osteoarthritis, left greater than right. Electronically Signed   By: Randa Ngo M.D.   On: 10/08/2022 15:41   DG Femur Portable 1 View Right  Result Date: 10/08/2022 CLINICAL DATA:  Trauma EXAM: RIGHT FEMUR PORTABLE 1 VIEW COMPARISON:  None Available. FINDINGS: Two frontal views of the right femur are obtained. There is a transverse fracture of the proximal right femoral diaphysis, with varus angulation at the fracture site. The right hip and right knee remain in anatomic alignment. Soft tissue swelling of the  proximal right thigh. IMPRESSION: 1. Displaced proximal right femoral diaphyseal fracture as above. Electronically Signed   By: Randa Ngo M.D.   On: 10/08/2022 15:40   Labs  Recent Labs    10/08/22 1518 10/08/22 1536  WBC 11.5*  --   HGB 13.4 14.3  HCT 42.8 42.0  PLT 202  --    Recent Labs    10/08/22 1518 10/08/22 1536  NA 138 141  K 2.9* 3.0*  CL 106 105  CO2 21*  --   BUN 13 14  CREATININE 0.68 0.50  GLUCOSE 115* 114*  CALCIUM 9.1  --    Lab Results  Component Value Date   INR 1.1 10/08/2022        Physical Examination  Patient is a 81 y.o. year old female who is alert, well appearing, and in no distress, mood is calm.  Orientation: oriented to person, place, time, and general circumstances  Vital Signs: BP 138/89   Pulse (!) 106   Temp 97.6 F (36.4 C) (Oral)   Resp 20   Ht '4\' 10"'$  (1.473 m)   Wt 47.6 kg   SpO2 99%   BMI 21.95 kg/m    Gait: Unable to ambulate with current injury.  Supine on stretcher in Buck's traction.  Heart: Normal rate Lungs: Non-labored breathing Abdomen: Soft, Non-tender   Right Upper Extremity: Inspection: Well-fitting well molded sugar-tong splint Palpation: Tender to palpation wrist side no distal radius fracture ROM: Limited due to immobilization in sugar-tong splint Strength: Intact APB, EDC, FDP index small finger, dorsal interossei Sensation: Intact light touch median, radial, ulnar distributions distally Skin: Intact Peripheral Vascular: Warm and well-perfused distally Joint Stability: Examination limited due to immobilization sugar-tong splint Reflexes: No pathologic Lymph Nodes: None Palpable Coordination: Limited by pain and injury   Left Upper Extremity: Inspection: Atraumatic Palpation: Nontender ROM: Full, painless Strength: Normal Sensation: Intact to light touch distally Skin: Intact Peripheral Vascular: Well perfused Joint Stability: No instability Reflexes: No pathologic Lymph Nodes: None  Palpable Coordination: Intact, normal    Right Lower Extremity: Inspection: Shortened Palpation: Tender to palpation at site of known femoral fracture ROM: Limited due to immobilization in Buck's traction Strength: Intact dorsiflexion, plantarflexion, EHL Sensation: Intact light touch distally in superficial peroneal, deep peroneal, tibial distributions Skin: Into active Peripheral Vascular: Normal DP pulse, warm and well-perfused distally Joint Stability: Stability examination deferred due to injury and immobilization in Buck's traction Reflexes: No pathologic Lymph Nodes: None Palpable Coordination: Limited by pain and injury   Left Lower Extremity: Inspection: Atraumatic Palpation: Nontender ROM: Full, painless Strength: Normal Sensation: Intact to light touch distally Skin: Intact Peripheral Vascular: Well perfused Joint Stability: No instability  Reflexes: No pathologic Lymph Nodes: None Palpable Coordination: Intact, normal    Pelvis: Skin: Intact Palpation: None tender Stability: No instability      The review of the patient's medications does not in any way constitute an endorsement, by this clinician,  of their use, dosage, indications, route, efficacy, interactions, or other clinical parameters.  This note was generated within the EP[employer] EMR using Dragon medical speech recognition software and may contain inherent errors or omissions not intended by the user. Grammatical and punctuation errors, random word insertions, deletions, pronoun errors and incomplete sentences are occasional consequences of this technology due to software limitations. Not all errors are caught or corrected.  Although every attempt is made to root out erroneus and incomplete transcription, the note may still not fully represent the intent or opinion of the author. If there are questions or concerns about the content of this note or information contained within the body of this dictation they should  be addressed directly with the author for clarification.

## 2022-10-08 NOTE — ED Triage Notes (Signed)
Pt BIB EMS from home due to a fall. Pt states right leg gave out and she collapsed. Pt has right forearm skin tear, deformity to right lateral femur and wrist. Pt denies LOC and blood thinners. Pt received 75 mcgs of fentanyl. Pt is axox4 on arrival.

## 2022-10-08 NOTE — ED Notes (Signed)
Dr Mable Fill paged for RN

## 2022-10-08 NOTE — Anesthesia Preprocedure Evaluation (Addendum)
Anesthesia Evaluation  Patient identified by MRN, date of birth, ID band Patient awake    Reviewed: Allergy & Precautions, NPO status , Patient's Chart, lab work & pertinent test results  History of Anesthesia Complications (+) PONV and history of anesthetic complications  Airway Mallampati: II  TM Distance: >3 FB Neck ROM: Full    Dental  (+) Dental Advisory Given   Pulmonary neg pulmonary ROS   breath sounds clear to auscultation       Cardiovascular negative cardio ROS  Rhythm:Regular Rate:Normal     Neuro/Psych negative neurological ROS     GI/Hepatic Neg liver ROS, hiatal hernia,GERD  ,,  Endo/Other  negative endocrine ROS    Renal/GU negative Renal ROS     Musculoskeletal  (+) Arthritis ,    Abdominal   Peds  Hematology negative hematology ROS (+)   Anesthesia Other Findings   Reproductive/Obstetrics                             Anesthesia Physical Anesthesia Plan  ASA: 3  Anesthesia Plan: General   Post-op Pain Management: Ofirmev IV (intra-op)*   Induction: Intravenous  PONV Risk Score and Plan: 4 or greater and Dexamethasone, Ondansetron and Treatment may vary due to age or medical condition  Airway Management Planned: Oral ETT  Additional Equipment:   Intra-op Plan:   Post-operative Plan: Extubation in OR  Informed Consent: I have reviewed the patients History and Physical, chart, labs and discussed the procedure including the risks, benefits and alternatives for the proposed anesthesia with the patient or authorized representative who has indicated his/her understanding and acceptance.     Dental advisory given  Plan Discussed with: CRNA  Anesthesia Plan Comments:         Anesthesia Quick Evaluation

## 2022-10-08 NOTE — ED Notes (Incomplete)
Trauma Response Nurse Documentation   JANAYAH ZAVADA is a 81 y.o. female arriving to [facility] ED via [facility]  On No antithrombotic. Trauma was activated as a Level 2 by Charge  based on the following trauma criteria Stable femur, humerus, or pelvic fracture via any mechanism except GLF. Trauma team at the bedside on patient arrival.   Patient cleared for CT by Dr. Maylon Peppers. Pt transported to CT with trauma response nurse present to monitor. RN remained with the patient throughout their absence from the department for clinical observation.   GCS 15.  History   Past Medical History:  Diagnosis Date   Acid reflux    Arthritis    Colon polyps    DJD (degenerative joint disease)    Hemorrhoids    History of hiatal hernia    Hypercholesteremia    IBS (irritable bowel syndrome)    Osteoporosis    PONV (postoperative nausea and vomiting)    Scoliosis      Past Surgical History:  Procedure Laterality Date   cataracts Bilateral    CHOLECYSTECTOMY     laparscopic   COLONOSCOPY  2020   DILATION AND CURETTAGE OF UTERUS     x2   EVALUATION UNDER ANESTHESIA WITH HEMORRHOIDECTOMY N/A 11/25/2019   Procedure: ANORECTAL EXAM UNDER ANESTHESIA WITH HEMORRHOIDECTOMY, HEMORRHOIDAL LIGATION/PEXY;  Surgeon: Michael Boston, MD;  Location: [place];  Service: General;  Laterality: N/A;   eye lid surgery Bilateral    EYE SURGERY     age 16 both eyes muscle, sx at [facility] for seeing double 1 eye   HIP SURGERY Bilateral    left 1988, right done 1988   NASAL ENDOSCOPY     esophagus stretched   NASAL SEPTUM SURGERY     thumb surgery Left    joint replaced   WRIST SURGERY Left        Initial Focused Assessment (If applicable, or please see trauma documentation): Airway-- clear Breathing - unlabored Circulation-- peripheral pulses strong, pedal pulse present in both feet  GCS - 15   CT's Completed:   CT Head and CT C-Spine   Interventions:  Xrays CT  scans Pain control  Plan for disposition:  {Trauma Dispo:26867}   Consults completed:  {Trauma Consults:26862} at ***.  Event Summary:  MTP Summary (If applicable):   Bedside handoff with {Trauma handoff:26863::"ED RN"} ***.    Lezlie Octave Tyjai Charbonnet  Trauma Response RN  Please call TRN at [phone] for further assistance.

## 2022-10-08 NOTE — ED Notes (Signed)
Pt transferred to hospital bed, ortho tech at bedside

## 2022-10-08 NOTE — Consult Note (Signed)
Reason for Consult:Right femur fx Referring Physician: Leanord Asal Time called: 9924 Time at bedside: [place] is an 81 y.o. female.  HPI: Valerie Hull was at home when her foot caught on the floor and she fell. She thinks her leg may have broken on the way down. She put out her right hand to break her fall and had pain in that as well. She was brought to the ED as a level 2 trauma activation and orthopedic surgery was consulted. She lives at home alone and uses a RW to ambulate.  Past Medical History:  Diagnosis Date   Acid reflux    Arthritis    Colon polyps    DJD (degenerative joint disease)    Hemorrhoids    History of hiatal hernia    Hypercholesteremia    IBS (irritable bowel syndrome)    Osteoporosis    PONV (postoperative nausea and vomiting)    Scoliosis     Past Surgical History:  Procedure Laterality Date   cataracts Bilateral    CHOLECYSTECTOMY     laparscopic   COLONOSCOPY  2020   DILATION AND CURETTAGE OF UTERUS     x2   EVALUATION UNDER ANESTHESIA WITH HEMORRHOIDECTOMY N/A 11/25/2019   Procedure: ANORECTAL EXAM UNDER ANESTHESIA WITH HEMORRHOIDECTOMY, HEMORRHOIDAL LIGATION/PEXY;  Surgeon: Jamelah Sitzer Boston, MD;  Location: [facility];  Service: General;  Laterality: N/A;   eye lid surgery Bilateral    EYE SURGERY     age 66 both eyes muscle, sx at [facility] for seeing double 1 eye   HIP SURGERY Bilateral    left 1988, right done 1988   NASAL ENDOSCOPY     esophagus stretched   NASAL SEPTUM SURGERY     thumb surgery Left    joint replaced   WRIST SURGERY Left     Family History  Problem Relation Age of Onset   Rheum arthritis Mother    Cancer Mother    Heart failure Father    Colitis Sister    Colitis Sister    Colon cancer Maternal Aunt    Colon polyps Neg Hx    Stomach cancer Neg Hx    Esophageal cancer Neg Hx     Social History:  reports that she has never smoked. She has never used smokeless tobacco. She reports that  she does not drink alcohol and does not use drugs.  Allergies:  Allergies  Allergen Reactions   Ampicillin Hives and Nausea And Vomiting    Has patient had a PCN reaction causing immediate rash, facial/tongue/throat swelling, SOB or lightheadedness with hypotension: No Has patient had a PCN reaction causing severe rash involving mucus membranes or skin necrosis: No Has patient had a PCN reaction that required hospitalization No Has patient had a PCN reaction occurring within the last 10 years: No If all of the above answers are "NO", then may proceed with Cephalosporin use.   Codeine Nausea And Vomiting   Crestor [Rosuvastatin] Other (See Comments)    Pain in joints   Lipitor [Atorvastatin]     elevated liver enzymes   Nitroglycerin Other (See Comments)    Blood pressure dropped with nitroglycerin cream   Sulfa Antibiotics Hives   Adhesive [Tape] Rash    Medications: I have reviewed the patient's current medications.  No results found for this or any previous visit (from the past 48 hour(s)).  DG Wrist Complete Right  Result Date: 10/08/2022 CLINICAL DATA:  Trauma EXAM: RIGHT WRIST - COMPLETE  3+ VIEW COMPARISON:  None Available. FINDINGS: Frontal, oblique, lateral views of the right wrist are obtained. There is an impacted fracture of the distal right radial metaphysis, with slight dorsal angulation at the fracture site. I do not see any definite intra-articular extension. There is also an impacted distal right ulnar metaphyseal fracture, with grossly normal anatomic alignment. Widening of the scapholunate interval is identified measuring up to 5 mm, which could reflect acute ligamentous injury, sequela from previous trauma, or chronic degenerative change. Mild proximal migration of the capitate suggests more chronic injury. The remainder of the right wrist is unremarkable. Diffuse soft tissue swelling is noted. IMPRESSION: 1. Acute impacted distal right radial and ulnar metaphyseal  fractures as above. 2. Widened scapholunate interval, mild proximal migration of the capitate. Favor sequela of chronic or degenerative change rather than acute injury. If there is focal tenderness at the anatomic snuff box, ulnar deviated views may be useful to better assess the scaphoid. 3. Diffuse soft tissue swelling. Electronically Signed   By: Randa Ngo M.D.   On: 10/08/2022 15:43   DG Chest Port 1 View  Result Date: 10/08/2022 CLINICAL DATA:  Trauma EXAM: PORTABLE CHEST 1 VIEW COMPARISON:  None Available. FINDINGS: No pleural effusion. No pneumothorax. No focal airspace opacity. No radiographically apparent displaced rib fracture. Normal cardiac and mediastinal contours. 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 Surgical clips in the right upper quadrant. IMPRESSION: 1. No active disease. 2. 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 Electronically Signed   By: Marin Roberts M.D.   On: 10/08/2022 15:41   DG Pelvis Portable  Result Date: 10/08/2022 CLINICAL DATA:  Trauma EXAM: PORTABLE PELVIS 1-2 VIEWS COMPARISON:  07/23/2021 FINDINGS: Two frontal views of the pelvis are obtained. Portions of the right iliac crest are excluded by collimation. There is a transverse fracture through the proximal right femoral diaphysis with varus angulation at the fracture site. There are no other acute displaced pelvic fractures. 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 Sacroiliac joints are unremarkable. Soft tissue swelling proximal right thigh. IMPRESSION: 1. Displaced proximal right femoral diaphyseal fracture, with varus angulation. 2. No other acute pelvic fractures. 3. Bilateral hip osteoarthritis, left greater than right. Electronically Signed   By: Randa Ngo M.D.   On: 10/08/2022 15:41   DG Femur Portable 1 View Right  Result Date: 10/08/2022 CLINICAL DATA:  Trauma EXAM: RIGHT FEMUR PORTABLE 1 VIEW COMPARISON:  None Available. FINDINGS: Two frontal views  of the right femur are obtained. There is a transverse fracture of the proximal right femoral diaphysis, with varus angulation at the fracture site. The right hip and right knee remain in anatomic alignment. Soft tissue swelling of the proximal right thigh. IMPRESSION: 1. Displaced proximal right femoral diaphyseal fracture as above. Electronically Signed   By: Randa Ngo M.D.   On: 10/08/2022 15:40    Review of Systems  HENT:  Negative for ear discharge, ear pain, hearing loss and tinnitus.   Eyes:  Negative for photophobia and pain.  Respiratory:  Negative for cough and shortness of breath.   Cardiovascular:  Negative for chest pain.  Gastrointestinal:  Negative for abdominal pain, nausea and vomiting.  Genitourinary:  Negative for dysuria, flank pain, frequency and urgency.  Musculoskeletal:  Positive for arthralgias (Right wrist and thigh). Negative for back pain, myalgias and neck pain.  Neurological:  Negative for dizziness and headaches.  Hematological:  Does not bruise/bleed easily.  Psychiatric/Behavioral:  The patient is not nervous/anxious.  Blood pressure 132/84, pulse 93, temperature 97.6 F (36.4 C), temperature source Oral, resp. rate (!) 26, height '4\' 10"'$  (1.473 m), weight 47.6 kg, SpO2 95 %. Physical Exam Constitutional:      General: She is not in acute distress.    Appearance: She is well-developed. She is not diaphoretic.  HENT:     Head: Normocephalic and atraumatic.  Eyes:     General: No scleral icterus.       Right eye: No discharge.        Left eye: No discharge.     Conjunctiva/sclera: Conjunctivae normal.  Neck:     Comments: C-collar Cardiovascular:     Rate and Rhythm: Normal rate and regular rhythm.  Pulmonary:     Effort: Pulmonary effort is normal. No respiratory distress.  Musculoskeletal:     Comments: Right shoulder, elbow, wrist, digits- no skin wounds, mod wrist TTP, no instability, no blocks to motion  Sens  Ax/R/M/U intact  Mot   Ax/ R/  PIN/ M/ AIN/ U intact  Rad 2+  RLE No traumatic wounds, ecchymosis, or rash  Thigh deformity and TTP  No knee or ankle effusion  Knee stable to varus/ valgus and anterior/posterior stress  Sens DPN, SPN, TN intact  Motor EHL, ext, flex, evers 5/5  DP 2+, PT 2+, No significant edema  Skin:    General: Skin is warm and dry.  Neurological:     Mental Status: She is alert.  Psychiatric:        Mood and Affect: Mood normal.        Behavior: Behavior normal.     Assessment/Plan: Right femur fx -- Plan IMN tonight by Dr. Mable Fill. Please keep NPO. Right wrist fx -- Should do well with non-operative management and NWB. Multiple medical problems including GERD and chronic back pain    Lisette Abu, PA-C Orthopedic Surgery [phone] 10/08/2022, 4:04 PM

## 2022-10-08 NOTE — Progress Notes (Signed)
Orthopedic Tech Progress Note Patient Details:  EVELINE SAUVE May 08, 1941 [id number] Level 2 Trauma requiring a sugartong splint to the right arm and 5lbs bucks traction to the right leg.  Ortho Devices Type of Ortho Device: Sugartong splint Ortho Device/Splint Location: Right arm Ortho Device/Splint Interventions: Application   Post Interventions Patient Tolerated: Well  Musculoskeletal Traction Type of Traction: Bucks Skin Traction Traction Location: Right leg Traction Weight: 5 lbs   Post Interventions Patient Tolerated: Well  Linus Salmons Maebell Lyvers 10/08/2022, 4:35 PM

## 2022-10-08 NOTE — ED Provider Notes (Signed)
[facility] Provider Note   CSN: [id number] Arrival date & time: 10/08/22  1506     History  Chief Complaint  Patient presents with   Valerie Hull is a 81 y.o. female.  Patient is an 81 year old female who presented to the emergency department after a fall.  Patient states that her leg gave out on her and she collapsed and fell.  She states that she heard a crack in her right thigh and was unable to get up and walk.  She states that she crawled to the phone to call 911.  She denies hitting her head or losing consciousness.  She denies any blood thinner use.  She denies any numbness or weakness.  She states that she is also having pain in her right wrist.  The history is provided by the patient and the EMS personnel. The history is limited by the condition of the patient (Critical acuity).  Fall       Home Medications Prior to Admission medications   Medication Sig Start Date End Date Taking? Authorizing Provider  Calcium Citrate-Vitamin D (CALCIUM + D PO) Take 1 tablet by mouth 4 (four) times daily.  Patient not taking: Reported on 05/21/2022    [provider]  diphenhydramine-acetaminophen (TYLENOL PM) 25-500 MG TABS tablet Take 1 tablet by mouth at bedtime as needed (pain/sleep).    [provider]  gabapentin (NEURONTIN) 300 MG capsule Take 300 mg by mouth 2 (two) times daily.    [provider]  Melatonin 5 MG CAPS Take by mouth at bedtime as needed.    [provider]  Multiple Vitamins-Minerals (CENTRUM SILVER ULTRA WOMENS PO) Take by mouth daily.    [provider]  omeprazole (PRILOSEC) 40 MG capsule Take 40 mg by mouth daily.    [provider]  oxyCODONE (OXY IR/ROXICODONE) 5 MG immediate release tablet Take 1-2 tablets (5-10 mg total) by mouth every 6 (six) hours as needed for moderate pain, severe pain or breakthrough pain. 11/25/19   Michael Boston, MD  Turmeric (QC TUMERIC COMPLEX PO)  Take by mouth. 1000 mg daily    [provider]  UNABLE TO FIND Vitamin d 3 50 mg tid    [provider]      Allergies    Ampicillin, Codeine, Crestor [rosuvastatin], Lipitor [atorvastatin], Nitroglycerin, Sulfa antibiotics, and Adhesive [tape]    Review of Systems   Review of Systems  Physical Exam Updated Vital Signs BP 138/89   Pulse (!) 106   Temp 97.6 F (36.4 C) (Oral)   Resp 20   Ht '4\' 10"'$  (1.473 m)   Wt 47.6 kg   SpO2 99%   BMI 21.95 kg/m  Physical Exam Vitals and nursing note reviewed.  Constitutional:      General: She is not in acute distress.    Appearance: Normal appearance.  HENT:     Head: Normocephalic and atraumatic.     Nose: Nose normal.     Mouth/Throat:     Mouth: Mucous membranes are moist.     Pharynx: Oropharynx is clear.  Eyes:     Extraocular Movements: Extraocular movements intact.     Conjunctiva/sclera: Conjunctivae normal.     Pupils: Pupils are equal, round, and reactive to light.  Neck:     Comments: No midline neck tenderness, c-collar in place Cardiovascular:     Rate and Rhythm: Normal rate and regular rhythm.     Pulses: Normal  pulses.     Heart sounds: Normal heart sounds.  Pulmonary:     Effort: Pulmonary effort is normal.     Breath sounds: Normal breath sounds.  Abdominal:     General: Abdomen is flat.     Palpations: Abdomen is soft.     Tenderness: There is no abdominal tenderness.  Musculoskeletal:     Comments: Tenderness with obvious deformity to right thigh, no bony tenderness to remainder of right lower extremity Tenderness to palpation of right wrist No tenderness to palpation of left upper or left lower extremity Pelvis stable, nontender No midline back tenderness  Skin:    General: Skin is warm and dry.     Comments: Skin tear to right forearm  Neurological:     General: No focal deficit present.     Mental Status: She is alert and oriented to person, place, and time.     Sensory: No  sensory deficit.     Motor: No weakness.  Psychiatric:        Mood and Affect: Mood normal.        Behavior: Behavior normal.     ED Results / Procedures / Treatments   Labs (all labs ordered are listed, but only abnormal results are displayed) Labs Reviewed  COMPREHENSIVE METABOLIC PANEL - Abnormal; Notable for the following components:      Result Value   Potassium 2.9 (*)    CO2 21 (*)    Glucose, Bld 115 (*)    All other components within normal limits  CBC - Abnormal; Notable for the following components:   WBC 11.5 (*)    All other components within normal limits  LACTIC ACID, PLASMA - Abnormal; Notable for the following components:   Lactic Acid, Venous 2.1 (*)    All other components within normal limits  I-STAT CHEM 8, ED - Abnormal; Notable for the following components:   Potassium 3.0 (*)    Glucose, Bld 114 (*)    All other components within normal limits  SURGICAL PCR SCREEN  ETHANOL  PROTIME-INR  URINALYSIS, ROUTINE W REFLEX MICROSCOPIC    EKG None  Radiology DG Wrist Complete Right  Result Date: 10/08/2022 CLINICAL DATA:  Trauma EXAM: RIGHT WRIST - COMPLETE 3+ VIEW COMPARISON:  None Available. FINDINGS: Frontal, oblique, lateral views of the right wrist are obtained. There is an impacted fracture of the distal right radial metaphysis, with slight dorsal angulation at the fracture site. I do not see any definite intra-articular extension. There is also an impacted distal right ulnar metaphyseal fracture, with grossly normal anatomic alignment. Widening of the scapholunate interval is identified measuring up to 5 mm, which could reflect acute ligamentous injury, sequela from previous trauma, or chronic degenerative change. Mild proximal migration of the capitate suggests more chronic injury. The remainder of the right wrist is unremarkable. Diffuse soft tissue swelling is noted. IMPRESSION: 1. Acute impacted distal right radial and ulnar metaphyseal fractures as  above. 2. Widened scapholunate interval, mild proximal migration of the capitate. Favor sequela of chronic or degenerative change rather than acute injury. If there is focal tenderness at the anatomic snuff box, ulnar deviated views may be useful to better assess the scaphoid. 3. Diffuse soft tissue swelling. Electronically Signed   By: Randa Ngo M.D.   On: 10/08/2022 15:43   DG Chest Port 1 View  Result Date: 10/08/2022 CLINICAL DATA:  Trauma EXAM: PORTABLE CHEST 1 VIEW COMPARISON:  None Available. FINDINGS: No pleural effusion. No pneumothorax. No  focal airspace opacity. No radiographically apparent displaced rib fracture. Normal cardiac and mediastinal contours. 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 Surgical clips in the right upper quadrant. IMPRESSION: 1. No active disease. 2. 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 Electronically Signed   By: Marin Roberts M.D.   On: 10/08/2022 15:41   DG Pelvis Portable  Result Date: 10/08/2022 CLINICAL DATA:  Trauma EXAM: PORTABLE PELVIS 1-2 VIEWS COMPARISON:  07/23/2021 FINDINGS: Two frontal views of the pelvis are obtained. Portions of the right iliac crest are excluded by collimation. There is a transverse fracture through the proximal right femoral diaphysis with varus angulation at the fracture site. There are no other acute displaced pelvic fractures. 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 Sacroiliac joints are unremarkable. Soft tissue swelling proximal right thigh. IMPRESSION: 1. Displaced proximal right femoral diaphyseal fracture, with varus angulation. 2. No other acute pelvic fractures. 3. Bilateral hip osteoarthritis, left greater than right. Electronically Signed   By: Randa Ngo M.D.   On: 10/08/2022 15:41   DG Femur Portable 1 View Right  Result Date: 10/08/2022 CLINICAL DATA:  Trauma EXAM: RIGHT FEMUR PORTABLE 1 VIEW COMPARISON:  None Available. FINDINGS: Two frontal views of the right  femur are obtained. There is a transverse fracture of the proximal right femoral diaphysis, with varus angulation at the fracture site. The right hip and right knee remain in anatomic alignment. Soft tissue swelling of the proximal right thigh. IMPRESSION: 1. Displaced proximal right femoral diaphyseal fracture as above. Electronically Signed   By: Randa Ngo M.D.   On: 10/08/2022 15:40    Procedures .Critical Care  Performed by: Kemper Durie, DO Authorized by: Kemper Durie, DO   Critical care provider statement:    Critical care time (minutes):  35   Critical care time was exclusive of:  Separately billable procedures and treating other patients   Critical care was necessary to treat or prevent imminent or life-threatening deterioration of the following conditions:  Trauma   Critical care was time spent personally by me on the following activities:  Development of treatment plan with patient or surrogate, discussions with consultants, evaluation of patient's response to treatment, examination of patient, obtaining history from patient or surrogate, ordering and performing treatments and interventions, ordering and review of laboratory studies, ordering and review of radiographic studies and re-evaluation of patient's condition   I assumed direction of critical care for this patient from another provider in my specialty: no     Care discussed with: admitting provider       Medications Ordered in ED Medications  chlorhexidine (HIBICLENS) 4 % liquid 4 Application (has no administration in time range)  potassium chloride SA (KLOR-CON M) CR tablet 40 mEq ( Oral MAR Hold 10/08/22 1910)  lactated ringers infusion ( Intravenous Anesthesia Volume Adjustment 10/08/22 2105)  0.9 % irrigation (POUR BTL) (1,000 mLs Irrigation Given 10/08/22 2040)  vancomycin (VANCOCIN) powder (1,000 mg Topical Given 10/08/22 2040)  fentaNYL (SUBLIMAZE) injection 25 mcg (25 mcg Intravenous Given 10/08/22  1516)  ondansetron (ZOFRAN) injection 4 mg (4 mg Intravenous Given 10/08/22 1528)  povidone-iodine 10 % swab 2 Application (2 Applications Topical Given 10/08/22 1918)  ceFAZolin (ANCEF) IVPB 2g/100 mL premix (2 g Intravenous Given 10/08/22 2015)  ondansetron (ZOFRAN) injection 4 mg (4 mg Intravenous Given 10/08/22 1627)  potassium chloride 10 mEq in 100 mL IVPB (10 mEq Intravenous New Bag/Given 10/08/22 1920)  etomidate (AMIDATE) injection (20 mg Intravenous Not Given 10/08/22  1735)  succinylcholine (ANECTINE) injection (100 mg Intravenous Not Given 10/08/22 1735)  ceFAZolin (ANCEF) 2-4 GM/100ML-% IVPB (  Override pull for Anesthesia 10/08/22 2032)  chlorhexidine (PERIDEX) 0.12 % solution 15 mL (15 mLs Mouth/Throat Given 10/08/22 1925)    Or  Oral care mouth rinse ( Mouth Rinse See Alternative 10/08/22 1925)    ED Course/ Medical Decision Making/ A&P Clinical Course as of 10/08/22 2146  Wed Oct 08, 2022  1707 Patient evaluated with orthopedics with likely plan for OR tonight pending CT reads. [VK]    Clinical Course User Index [VK] Kemper Durie, DO                           Medical Decision Making This patient presents to the ED with chief complaint(s) of fall with pertinent past medical history of osteoporosis, degenerative disc disease which further complicates the presenting complaint. The complaint involves an extensive differential diagnosis and also carries with it a high risk of complications and morbidity.    The differential diagnosis includes ICH, mass effect, femur fracture, hip fracture, wrist fracture, no other blunt traumatic injuries on exam  Additional history obtained: Additional history obtained from EMS  Records reviewed N/A  ED Course and Reassessment: Due to patient's leg deformity, she was made a prehospital arrival level 2 trauma.  I was immediately present at patient's arrival.  Primary survey was intact.  Secondary survey was significant to an obvious femur  deformity, right lower extremity was neurovascularly intact.  Secondary survey also significant for right wrist tenderness and skin tear.  Orthopedic surgery was notified.  She will additionally have CT head and C-spine due to her age and distracting injury.  X-rays will be performed.  She was given pain control.  Independent labs interpretation:  The following labs were independently interpreted: Mild lactate elevation otherwise within normal range  Independent visualization of imaging: - I independently visualized the following imaging with scope of interpretation limited to determining acute life threatening conditions related to emergency care: Chest x-ray, pelvis x-ray, right femur x-ray, right wrist x-ray, which revealed right distal radius and ulna fracture, right femur midshaft fracture  Consultation: - Consulted or discussed management/test interpretation w/ external professional: Orthopedics  Consideration for admission or further workup: Patient will require admission for operative management Social Determinants of health: N/A    Amount and/or Complexity of Data Reviewed Labs: ordered. Radiology: ordered.  Risk Prescription drug management.          Final Clinical Impression(s) / ED Diagnoses Final diagnoses:  Closed fracture of shaft of right femur, unspecified fracture morphology, initial encounter ([place])  Closed fracture of right wrist, initial encounter    Rx / DC Orders ED Discharge Orders     None         Kemper Durie, DO 10/08/22 2147

## 2022-10-08 NOTE — Op Note (Signed)
OPERATIVE NOTE  Valerie Hull female 81 y.o. 10/08/2022  PREOPERATIVE DIAGNOSIS: Right proximal femoral shaft fracture Osteoporosis with current right proximal femoral shaft insufficiency fracture  POSTOPERATIVE DIAGNOSIS: Right proximal femoral shaft fracture (S72.331A) Osteoporosis with current right proximal femoral shaft insufficiency fracture (M80.051A)  PROCEDURE(S): Open treatment right femoral shaft fracture with cephalomedullary nail (06237) Operative use of fluoroscopy for above procedure(s) (62831)   SURGEON: Georgeanna Harrison, M.D.  ASSISTANT(S): None  ANESTHESIA: General  FINDINGS: Preoperative Examination: Right Lower Extremity: Inspection: Shortened Palpation: Tender to palpation at site of known femoral fracture ROM: Limited due to immobilization in Buck's traction Strength: Intact dorsiflexion, plantarflexion, EHL Sensation: Intact light touch distally in superficial peroneal, deep peroneal, tibial distributions Skin: Into active Peripheral Vascular: Normal DP pulse, warm and well-perfused distally  Operative Findings: Displaced oblique fracture of the proximal femoral shaft redemonstrated on operative fluoroscopy.  Reduction achieved without opening fracture site with a combination of traction ligamentotaxis on Hana table, pressure on the posterior aspect of the distal fragment, and intramedullary reduction with the finger reduction tool.  This resulted in reduction based on a cortical reasonable restoration of length and alignment.  Reduction was confirmed on orthogonal AP and lateral fluoroscopic views.  Appropriate intramedullary placement of long cephalomedullary nail including 2 distal interlock screws and proximal lag screw.  IMPLANTS: Implant Name Type Inv. Item Serial No. Manufacturer Lot No. LRB No. Used Action  NAIL TI CANN 11MM 130D HIP - [id number] S Nail NAIL TI CANN 11MM 130D HIP [id number] S [employer] [id number] Right 1 Implanted  SCREW FENS  TFNA 85 - [id number] Screw SCREW FENS TFNA 85 [id number] [employer] [id number] Right 1 Implanted  SCREW LOCK IM 5X36 - [id number] Screw SCREW LOCK IM 5X36 [id number] [employer] [id number] Right 1 Implanted  SCREW LOCK IM NL 5X38 - [id number] Screw SCREW LOCK IM NL 5X38 [id number] Dewitt Hoes [id number] Right 1 Implanted    INDICATIONS:  The patient is a 81 y.o. female who sustained mechanical fall at her home and presented to the emergency room she is found to have sustained a right nondisplaced impacted distal radius fracture as well as a displaced right proximal femoral shaft fracture.  Due to the morbidity associated with nonoperative treatment, she did wish to proceed with operative treatment of right femur fracture with an intramedullary implant.  She understood the risks, benefits and alternatives to surgery which include but are not limited to bleeding, wound healing complications, infection, damage to surrounding structures, persistent pain, stiffness, lack of improvement, potential for subsequent arthritis or worsening of pre-existing arthritis, nonunion, malunion, and need for further surgery, as well as complications related to anesthesia, cardiovascular complications, and death.  She also understood the potential for continued pain, and that there were no guarantees of acceptable outcome.  After weighing these risks the patient opted to proceed with surgery.  TECHNIQUE: Patient was identified in the preoperative holding area.  The right femur was confirmed as the appropriate operative site and marked by me.  Consent was signed by myself and the patient and witnessed by the preoperative nurse.  No block was performed by anesthesia in the preoperative holding area.  Patient was taken to the operative suite and placed supine on the operative table.  Anesthesia was induced by the anesthesia team.  The patient was positioned appropriately for the procedure and all bony prominences were well  padded.  A tourniquet was not used.  Preoperative antibiotics were given. The extremity was prepped and draped in the usual sterile fashion  and surgical timeout was performed.  Surgery was performed on the Hana table.  Restoration of length was obtained applying traction.  There was residual angulation with posterior displacement of the proximal aspect of the distal fragment.  Surface anatomy was marked out laterally.  Incision for trochanteric entry nail was identified fluoroscopically.  Incision was marked on the skin with a short curvilinear incision proximal to the greater trochanter.  Skin was sharply.  Underlying subcutaneous fat tissue was dissected with Bovie electrocautery down to fascia layer.  Fascia was split sharply in line with fibers.  Underlying muscle split bluntly in line with fibers posterior trochanteric starting point.  Starting wire was positioned under fluoroscopic guidance at the trochanteric starting point advanced a center center position of the proximal intramedullary canal of the proximal femur.  Proximal femur was cannulated with the entry reamer.  Starting wire entry reamer were withdrawn.  The finger reduction tool was introduced into the proximal femur and under fluoroscopic guidance was advanced across the fracture into the intramedullary canal of the distal fragment.  A ball-tipped guidewire was then passed through the finger reduction tool into the distal fragment and the finger reduction tool was withdrawn.  Ball-tipped guidewire was impacted distally down the femur into a center center position distally at the level of the knee.  The fracture was then held in a more reduced position applying pressure posteriorly over the proximal aspect of the distal fragment, and sequential reaming was carried out up to 12.5 mm diameter with good cortical chatter to accommodate a 11.0 mm diameter nail.  Measurement was obtained off the guidewire.  A cephalomedullary nail implant was selected to  protect the femoral neck given the patient's frailty and osteoporosis.  The nail was impacted into position over the guidewire.  Entry point for the laxity was noted on the skin.  Skin was then sharply.  Underlying subcutaneous tissue and IT band and muscle was dissected sharply exposing lateral femoral cortex.  Triple sleeve trocar was fastened position lateral femoral cortex and guidewire for the lag screw was advanced proximally through femoral neck to the subchondral bone of the femoral head, taking care to avoid subchondral penetration.  Measurement for the last 2 was obtained of the wire.  Drill path for the lag screw was drilled over the wire.  Lecture was Durene Fruits into position and the construct was statically locked proximally.  At this point, the rotational alignment of the fracture was confirmed using radiographic cortical reads, and traction was let off, closing down the fracture And reducing the fracture.  Reduction was once again confirmed on orthogonal AP and lateral fluoroscopic views.  The location for the distal interlock screws was noted fluoroscopically.  In this area skin was incised sharply.  Underlying subcutaneous tissue, IT band, and muscle were split sharply exposing lateral femoral cortex.  2 distal interlock screws were placed using perfect circles technique.  Final x-rays demonstrated reduction of the fracture with appropriate placement of the long cephalomedullary nail including proximal Eksir and 2 distal interlock screws.  Was were copiously irrigated and hemostasis was obtained.  1 g vancomycin powder was placed deep in all wounds.  Fascial layer was closed with interrupted #1 figure-of-eight PDS, followed by simple inverted erupted #0 Vicryl in the deep fat, followed by simple inverted erupted #2-0 Monocryl deep dermal, followed by running #3 Monocryl subcuticular.  Skin was sealed Dermabond.  Sutured to skin with Steri-Strips.  Aquacel dressings placed over the incisions.  Patient was  awakened from  anesthesia and transferred to PACU in stable condition.  She tolerated procedure well.  No complications noted intraoperatively.  POSTOPERATIVE INSTRUCTIONS: Mobility: Out of bed with PT/OT Pain control: Continue to wean/titrate to appropriate oral regimen DVT Prophylaxis: Lovenox 40 mg daily x 6 weeks Further surgical plans: None RLE: Weightbearing as tolerated, no restrictions Dressing care: Keep AQUACEL on and dry for up to 14 days.  Do not allow surgical area to get wet before that.  Remove AQUACEL dressing after 14 days and allow area to get wet in shower but DO NOT SUBMERGE until wound is evaluated in clinic.  In most cases skin glue is used and no additional dressing is necessary.  Disposition: Per primary team is medically appropriate Follow-up: Please call [place] [phone]) to schedule follow-op appointment for 2 weeks after surgery.  TOURNIQUET TIME: * No tourniquets in log *  BLOOD LOSS: 150 mL         DRAINS: None         SPECIMEN: None       COMPLICATIONS:  * No complications entered in OR log *         DISPOSITION: PACU - hemodynamically stable.         CONDITION: stable   Georgeanna Harrison M.D. Orthopaedic Surgery [facility]   Portions of the record have been created with voice recognition software.  Grammatical and punctuation errors, random word insertions, wrong-word or "sound-a-like" substitutions, pronoun errors (inaccuracies and/or substitutions), and/or incomplete sentences may have occurred due to the inherent limitations of voice recognition software.  Not all errors are caught or corrected.  Although every attempt is made to root out erroneous and incomplete transcription, the note may still not fully represent the intent or opinion of the author.  Read the chart carefully and recognize, using context, where errors/substitutions have occurred.  Any questions or concerns about the  content of this note or information contained within the body of this dictation should be addressed directly with the author for clarification.

## 2022-10-08 NOTE — Discharge Instructions (Signed)
Discharge instructions for Dr. Georgeanna Harrison, M.D., Orthopaedic Surgeon, [place]:  Diet: As you were doing prior to hospitalization, unless instructed otherwise by medical team, dietary/nutrition team, etc. Dressing:  Keep AQUACEL on and dry for up to 14 days.  Do not allow surgical area to get wet before that.  Remove AQUACEL dressing after 14 days and allow area to get wet in shower but DO NOT SUBMERGE until wound is evaluated in clinic.  In most cases skin glue is used and no additional dressing is necessary.  Shower:  Unless otherwise specified, may shower but keep the wounds dry, use an occlusive plastic wrap, NO SOAKING IN TUB.  If the bandage gets wet, change with a clean dry gauze.  Unless otherwise specified, after 5 days dressing(s) should be removed (7 days for PREVENA dressings) and wound(s) may get wet in the shower by allowing water to gently run over.  Again, no soaking in tub, and do NOT submerge for at least 2 weeks!!! Activity:  Increase activity slowly as tolerated.  If you right leg is injured or immobilized, no driving for 6 weeks or until discussed with your surgeon.  If you have an injury or immobilization of the left lower extremity you may not operate a clutch. Please note that driving with any kind of immobilization for the upper extremity (sling, shoulder brace, splint, cast, etc.) may also be considered impaired driving and should not be attempted. Weight Bearing: WEIGHTBEARING AS TOLERATED (WBAT) on the RIGHT LOWER EXTREMITY. To prevent constipation: You may use over-the-counter stool softener(s) such as Colace (over the counter) 100 mg by mouth twice a day and/or Miralax (over the counter) for constipation as needed.  Drink plenty of fluids (prune juice may be helpful) and high fiber foods.  Itching:  If you experience itching with your medications, try taking only a single pain pill, or even half a pain pill at a time.  You can also use  benadryl over the counter for itching or also to help with sleep.  Precautions:  If you experience chest pain or shortness of breath - call 911 immediately for transfer to the hospital emergency department!! Medications: Please contact the clinic during office hours (Monday through Friday, 0800 to 1600) if you need a refill on any medications.  Please monitor medications and allow 24 to 48 hours to process refill request!!!!  Please note that only medications directly related to the surgery can be prescribed.  For other medications (e.g. blood pressure medicines, sleeping medicines, etc.), please contact the prescribing physician or your primary care provider. DVT Prophylaxis: Lovenox 40 mg daily x 6 weeks postoperatively  If you develop a fever greater that 101.1 deg F, purulent drainage from wound, increased redness or drainage from wound, or calf pain -- Call the office at [phone].

## 2022-10-08 NOTE — Anesthesia Procedure Notes (Addendum)
Procedure Name: Intubation Date/Time: 10/08/2022 8:08 PM  Performed by: Rande Brunt, CRNAPre-anesthesia Checklist: Patient identified, Emergency Drugs available, Suction available and Patient being monitored Patient Re-evaluated:Patient Re-evaluated prior to induction Oxygen Delivery Method: Circle System Utilized Preoxygenation: Pre-oxygenation with 100% oxygen Induction Type: IV induction, Rapid sequence and Cricoid Pressure applied Laryngoscope Size: Glidescope and 3 Grade View: Grade I Tube type: Oral Tube size: 7.0 mm Number of attempts: 1 Airway Equipment and Method: Stylet Placement Confirmation: ETT inserted through vocal cords under direct vision, positive ETCO2 and breath sounds checked- equal and bilateral Secured at: 21 cm Tube secured with: Tape Dental Injury: Teeth and Oropharynx as per pre-operative assessment  Comments: Neck remained in C collar for stabilization and remained midline for intubation.

## 2022-10-08 NOTE — ED Notes (Signed)
Silvestre Gunner, PA at bedside

## 2022-10-08 NOTE — Transfer of Care (Signed)
Immediate Anesthesia Transfer of Care Note  Patient: Valerie Hull  Procedure(s) Performed: RIGHT INTRAMEDULLARY (IM) NAIL FEMORAL (Right)  Patient Location: PACU  Anesthesia Type:General  Level of Consciousness: drowsy  Airway & Oxygen Therapy: Patient Spontanous Breathing  Post-op Assessment: Report given to RN and Post -op Vital signs reviewed and stable  Post vital signs: Reviewed and stable  Last Vitals:  Vitals Value Taken Time  BP 97/67 10/08/22 2234  Temp    Pulse 112 10/08/22 2242  Resp 26 10/08/22 2242  SpO2 100 % 10/08/22 2242  Vitals shown include unvalidated device data.  Last Pain:  Vitals:   10/08/22 1911  TempSrc: Oral  PainSc: 5          Complications: No notable events documented.

## 2022-10-09 ENCOUNTER — Inpatient Hospital Stay (HOSPITAL_COMMUNITY): Payer: Medicare Other

## 2022-10-09 DIAGNOSIS — K219 Gastro-esophageal reflux disease without esophagitis: Secondary | ICD-10-CM | POA: Diagnosis not present

## 2022-10-09 DIAGNOSIS — M4120 Other idiopathic scoliosis, site unspecified: Secondary | ICD-10-CM

## 2022-10-09 DIAGNOSIS — M545 Low back pain, unspecified: Secondary | ICD-10-CM

## 2022-10-09 DIAGNOSIS — W19XXXA Unspecified fall, initial encounter: Secondary | ICD-10-CM | POA: Diagnosis not present

## 2022-10-09 DIAGNOSIS — M810 Age-related osteoporosis without current pathological fracture: Secondary | ICD-10-CM

## 2022-10-09 DIAGNOSIS — E876 Hypokalemia: Secondary | ICD-10-CM

## 2022-10-09 DIAGNOSIS — Y92009 Unspecified place in unspecified non-institutional (private) residence as the place of occurrence of the external cause: Secondary | ICD-10-CM

## 2022-10-09 DIAGNOSIS — S7221XA Displaced subtrochanteric fracture of right femur, initial encounter for closed fracture: Secondary | ICD-10-CM

## 2022-10-09 DIAGNOSIS — M549 Dorsalgia, unspecified: Secondary | ICD-10-CM | POA: Insufficient documentation

## 2022-10-09 LAB — BASIC METABOLIC PANEL
Anion gap: 10 (ref 5–15)
BUN: 14 mg/dL (ref 8–23)
CO2: 19 mmol/L — ABNORMAL LOW (ref 22–32)
Calcium: 8.3 mg/dL — ABNORMAL LOW (ref 8.9–10.3)
Chloride: 107 mmol/L (ref 98–111)
Creatinine, Ser: 0.69 mg/dL (ref 0.44–1.00)
GFR, Estimated: >60 mL/min (ref >60)
Glucose, Bld: 115 mg/dL — ABNORMAL HIGH (ref 70–99)
Potassium: 3.7 mmol/L (ref 3.5–5.1)
Sodium: 136 mmol/L (ref 135–145)

## 2022-10-09 LAB — MAGNESIUM: Magnesium: 1.7 mg/dL (ref 1.7–2.4)

## 2022-10-09 LAB — CBC
HCT: 34.3 % — ABNORMAL LOW (ref 36.0–46.0)
Hemoglobin: 11.3 g/dL — ABNORMAL LOW (ref 12.0–15.0)
MCH: 28 pg (ref 26.0–34.0)
MCHC: 32.9 g/dL (ref 30.0–36.0)
MCV: 85.1 fL (ref 80.0–100.0)
Platelets: 181 10*3/uL (ref 150–400)
RBC: 4.03 MIL/uL (ref 3.87–5.11)
RDW: 14.6 % (ref 11.5–15.5)
WBC: 14.9 10*3/uL — ABNORMAL HIGH (ref 4.0–10.5)
nRBC: 0 % (ref 0.0–0.2)

## 2022-10-09 MED ORDER — CEFAZOLIN SODIUM-DEXTROSE 2-4 GM/100ML-% IV SOLN
2.0000 g | Freq: Four times a day (QID) | INTRAVENOUS | Status: AC
  Administered 2022-10-09 (×2): 2 g via INTRAVENOUS
  Filled 2022-10-09 (×2): qty 100

## 2022-10-09 MED ORDER — SODIUM CHLORIDE 0.9 % IV SOLN
12.5000 mg | Freq: Four times a day (QID) | INTRAVENOUS | Status: DC | PRN
  Administered 2022-10-09: 12.5 mg via INTRAVENOUS
  Filled 2022-10-09: qty 0.5
  Filled 2022-10-09: qty 12.5

## 2022-10-09 MED ORDER — ENOXAPARIN SODIUM 40 MG/0.4ML IJ SOSY
40.0000 mg | PREFILLED_SYRINGE | Freq: Every day | INTRAMUSCULAR | Status: DC
  Administered 2022-10-09 – 2022-10-11 (×3): 40 mg via SUBCUTANEOUS
  Filled 2022-10-09 (×3): qty 0.4

## 2022-10-09 MED ORDER — ONDANSETRON HCL 4 MG/2ML IJ SOLN
4.0000 mg | Freq: Four times a day (QID) | INTRAMUSCULAR | Status: DC | PRN
  Administered 2022-10-09 (×2): 4 mg via INTRAVENOUS
  Filled 2022-10-09 (×2): qty 2

## 2022-10-09 MED ORDER — GABAPENTIN 300 MG PO CAPS
300.0000 mg | ORAL_CAPSULE | Freq: Two times a day (BID) | ORAL | Status: DC
  Administered 2022-10-10 – 2022-10-11 (×3): 300 mg via ORAL
  Filled 2022-10-09 (×3): qty 1

## 2022-10-09 MED ORDER — MORPHINE SULFATE (PF) 2 MG/ML IV SOLN: 0.5000 mg | INTRAVENOUS | Status: DC | PRN

## 2022-10-09 MED ORDER — TRANEXAMIC ACID-NACL 1000-0.7 MG/100ML-% IV SOLN
1000.0000 mg | Freq: Once | INTRAVENOUS | Status: AC
  Administered 2022-10-09: 1000 mg via INTRAVENOUS
  Filled 2022-10-09: qty 100

## 2022-10-09 MED ORDER — PHENOL 1.4 % MT LIQD: 1.0000 | OROMUCOSAL | Status: DC | PRN

## 2022-10-09 MED ORDER — ONDANSETRON HCL 4 MG PO TABS: 4.0000 mg | ORAL_TABLET | Freq: Four times a day (QID) | ORAL | Status: DC | PRN

## 2022-10-09 MED ORDER — DOCUSATE SODIUM 100 MG PO CAPS
100.0000 mg | ORAL_CAPSULE | Freq: Two times a day (BID) | ORAL | Status: DC
  Administered 2022-10-09 – 2022-10-11 (×5): 100 mg via ORAL
  Filled 2022-10-09 (×6): qty 1

## 2022-10-09 MED ORDER — ACETAMINOPHEN 500 MG PO TABS
500.0000 mg | ORAL_TABLET | Freq: Four times a day (QID) | ORAL | Status: AC
  Administered 2022-10-09: 500 mg via ORAL
  Filled 2022-10-09 (×3): qty 1

## 2022-10-09 MED ORDER — MENTHOL 3 MG MT LOZG: 1.0000 | LOZENGE | OROMUCOSAL | Status: DC | PRN

## 2022-10-09 MED ORDER — PANTOPRAZOLE SODIUM 40 MG PO TBEC
80.0000 mg | DELAYED_RELEASE_TABLET | Freq: Every day | ORAL | Status: DC
  Administered 2022-10-10 – 2022-10-11 (×2): 80 mg via ORAL
  Filled 2022-10-09 (×2): qty 2

## 2022-10-09 MED ORDER — ACETAMINOPHEN 325 MG PO TABS
325.0000 mg | ORAL_TABLET | Freq: Four times a day (QID) | ORAL | Status: DC | PRN
  Administered 2022-10-11: 650 mg via ORAL
  Filled 2022-10-09: qty 2

## 2022-10-09 MED ORDER — ALENDRONATE SODIUM 70 MG PO TABS: 70.0000 mg | ORAL_TABLET | ORAL | Status: DC

## 2022-10-09 MED ORDER — CHLORHEXIDINE GLUCONATE CLOTH 2 % EX PADS
6.0000 | MEDICATED_PAD | Freq: Every day | CUTANEOUS | Status: DC
  Administered 2022-10-10 – 2022-10-11 (×2): 6 via TOPICAL

## 2022-10-09 MED ORDER — HYDROCODONE-ACETAMINOPHEN 5-325 MG PO TABS
1.0000 | ORAL_TABLET | ORAL | Status: DC | PRN
  Administered 2022-10-10: 1 via ORAL
  Administered 2022-10-10: 2 via ORAL
  Filled 2022-10-09 (×2): qty 2

## 2022-10-09 MED ORDER — HYDROCODONE-ACETAMINOPHEN 7.5-325 MG PO TABS: 1.0000 | ORAL_TABLET | ORAL | Status: DC | PRN

## 2022-10-09 NOTE — Evaluation (Signed)
Physical Therapy Evaluation Patient Details Name: Valerie Hull MRN: [id number] DOB: September 08, 1941 Today's Date: 10/09/2022  History of Present Illness  81 yo female presents to [facility] on 12/[facility] with fall, pt sustaining R femoral shaft fracture and R nondisplaced impacted distal radius fracture. s/p IMN R femoral shaft fx on 12/6. PMH includes OA, osteoporosis, IBS, scoliosis.  Clinical Impression   Pt presents with generalized weakness, RLE pain, nausea with vomiting on and off today, impaired balance with history of falls, and decreased activity tolerance. Pt to benefit from acute PT to address deficits. Pt sat EOB without PT assist, declines transfer into standing or OOB given pain and nausea. Pt overall requiring mod physical assist for bed-level mobility. PT to progress mobility as tolerated, and will continue to follow acutely.         Recommendations for follow up therapy are one component of a multi-disciplinary discharge planning process, led by the attending physician.  Recommendations may be updated based on patient status, additional functional criteria and insurance authorization.  Follow Up Recommendations Skilled nursing-short term rehab (<3 hours/day) Can patient physically be transported by private vehicle: No    Assistance Recommended at Discharge Frequent or constant Supervision/Assistance  Patient can return home with the following  A lot of help with walking and/or transfers;A lot of help with bathing/dressing/bathroom    Equipment Recommendations None recommended by PT  Recommendations for Other Services       Functional Status Assessment Patient has had a recent decline in their functional status and demonstrates the ability to make significant improvements in function in a reasonable and predictable amount of time.     Precautions / Restrictions Precautions Precautions: Fall Restrictions Weight Bearing Restrictions: Yes RUE Weight Bearing: Non weight bearing RLE  Weight Bearing: Weight bearing as tolerated      Mobility  Bed Mobility Overal bed mobility: Needs Assistance Bed Mobility: Supine to Sit, Sit to Supine     Supine to sit: Mod assist Sit to supine: Mod assist   General bed mobility comments: assist for trunk and LE management, pt nauseous and dry heaving once EOB    Transfers                   General transfer comment: pt declines due to nausea    Ambulation/Gait                  Stairs            Wheelchair Mobility    Modified Rankin (Stroke Patients Only)       Balance Overall balance assessment: Needs assistance, History of Falls Sitting-balance support: No upper extremity supported, Feet supported Sitting balance-Leahy Scale: Fair Sitting balance - Comments: once assisted with balance by PT, pt able to sit unsupported                                     Pertinent Vitals/Pain Pain Assessment Pain Assessment: Faces Faces Pain Scale: Hurts a little bit Pain Location: RLE and RUE Pain Descriptors / Indicators: Sore, Discomfort Pain Intervention(s): Limited activity within patient's tolerance, Monitored during session, Repositioned    Home Living Family/patient expects to be discharged to:: Private residence Living Arrangements: Alone   Type of Home: House Home Access: Stairs to enter Entrance Stairs-Rails: Chemical engineer of Steps: 2   Home Layout: One level Home Equipment: Conservation officer, nature (2 wheels);BSC/3in1  Prior Function Prior Level of Function : Independent/Modified Independent                     Hand Dominance   Dominant Hand: Right    Extremity/Trunk Assessment   Upper Extremity Assessment Upper Extremity Assessment: Defer to OT evaluation    Lower Extremity Assessment Lower Extremity Assessment: Generalized weakness;RLE deficits/detail RLE Deficits / Details: anticipated post-op weakness; able to perform quad set,  limited ROM heel slide, ankle pump    Cervical / Trunk Assessment Cervical / Trunk Assessment: Normal  Communication   Communication: No difficulties  Cognition Arousal/Alertness: Awake/alert Behavior During Therapy: Anxious Overall Cognitive Status: Within Functional Limits for tasks assessed                                          General Comments      Exercises     Assessment/Plan    PT Assessment Patient needs continued PT services  PT Problem List Decreased strength;Decreased mobility;Decreased range of motion;Decreased activity tolerance;Decreased balance;Decreased knowledge of use of DME;Pain;Decreased safety awareness       PT Treatment Interventions DME instruction;Therapeutic activities;Gait training;Therapeutic exercise;Patient/family education;Balance training;Functional mobility training;Neuromuscular re-education    PT Goals (Current goals can be found in the Care Plan section)  Acute Rehab PT Goals Patient Stated Goal: home PT Goal Formulation: With patient Time For Goal Achievement: 10/23/22 Potential to Achieve Goals: Good    Frequency Min 3X/week     Co-evaluation               AM-PAC PT "6 Clicks" Mobility  Outcome Measure Help needed turning from your back to your side while in a flat bed without using bedrails?: A Little Help needed moving from lying on your back to sitting on the side of a flat bed without using bedrails?: A Lot Help needed moving to and from a bed to a chair (including a wheelchair)?: A Lot Help needed standing up from a chair using your arms (e.g., wheelchair or bedside chair)?: A Lot Help needed to walk in hospital room?: A Lot Help needed climbing 3-5 steps with a railing? : Total 6 Click Score: 12    End of Session Equipment Utilized During Treatment: Oxygen Activity Tolerance: Patient tolerated treatment well;Patient limited by pain Patient left: in bed;with call bell/phone within reach;with bed  alarm set Nurse Communication: Mobility status PT Visit Diagnosis: Muscle weakness (generalized) (M62.81)    Time: 6629-4765 PT Time Calculation (min) (ACUTE ONLY): 27 min   Charges:   PT Evaluation $PT Eval Low Complexity: 1 Low PT Treatments $Therapeutic Activity: 8-22 mins       Stacie Glaze, PT DPT Acute Rehabilitation Services Pager [phone]  Office [phone]   Roxine Caddy E Ruffin Pyo 10/09/2022, 4:25 PM

## 2022-10-09 NOTE — H&P (Signed)
History and Physical   Valerie Hull:[id number] DOB: 1941-05-14 DOA: 10/08/2022  PCP: Kathyrn Lass, MD   Patient coming from: Home  Chief Complaint: Fall  HPI: Valerie Hull is a 81 y.o. female with medical history significant of hypertension, hyperlipidemia**, diverticulosis, GERD, scoliosis, IBS, osteoporosis presenting after fall.  Patient reports that her leg gave out while she was walking and she collapsed and fell.  She heard a crack at her right thigh and thinks she broke her leg.  She was unable to walk afterwards but was able to crawl to the phone and call 911.  She states she did not hit her head but she did land on her right wrist which hurt as well.  She denies fevers, chills, chest pain, shortness of breath, abdominal pain, constipation, diarrhea, nausea vomiting.  Today: Patient reporting some reflux symptoms and nausea postoperatively and due to her not receiving her PPI yet.  She was somewhat frustrated with the delay in her c-collar being cleared.  She is in a better mood when seen and is about to go get her CT scan done to clear her C-spine.  She was updated on the plan of care and has no questions.  Pain remains controlled.  ED/initial hospital course: Blood pressure thus far has been in the 811B to 147 systolic, heart rate in the 90s to 100s, at 1 point required 2 L but this appears to be perioperative currently on room air.  Lab workup included CMP with potassium 2.9, bicarb 21, glucose 115.  CBC with mild leukocytosis 11.5.  PT and INR normal.  Lactic acid borderline at 2.1.  Urinalysis pending.  Ethanol level negative.  Chest x-ray showed no acute abnormality.  Right wrist show distal radius and ulnar fracture.  X-ray of the pelvis and of the right femur showed right diaphyseal fracture.  Patient received supplemental potassium and pain medication in the ED.  Patient was subsequently seen by orthopedic surgery who took patient for surgical repair of this right femur.   This was repaired successfully.  During this initial period of evaluation and perioperative period no consult was placed to hospitalist service for evaluation.  This was noted this morning on rounds by the orthopedic service and they have requested [place] to take over as primary service with their continued consult.  Review of Systems: As per HPI otherwise all other systems reviewed and are negative.  Past Medical History:  Diagnosis Date   Acid reflux    Arthritis    Colon polyps    DJD (degenerative joint disease)    Hemorrhoids    History of hiatal hernia    Hypercholesteremia    IBS (irritable bowel syndrome)    Osteoporosis    PONV (postoperative nausea and vomiting)    Scoliosis     Past Surgical History:  Procedure Laterality Date   cataracts Bilateral    CHOLECYSTECTOMY     laparscopic   COLONOSCOPY  2020   DILATION AND CURETTAGE OF UTERUS     x2   EVALUATION UNDER ANESTHESIA WITH HEMORRHOIDECTOMY N/A 11/25/2019   Procedure: ANORECTAL EXAM UNDER ANESTHESIA WITH HEMORRHOIDECTOMY, HEMORRHOIDAL LIGATION/PEXY;  Surgeon: Michael Boston, MD;  Location: [place];  Service: General;  Laterality: N/A;   eye lid surgery Bilateral    EYE SURGERY     age 21 both eyes muscle, sx at [facility] for seeing double 1 eye   HIP SURGERY Bilateral    left 1988, right done 1988   NASAL  ENDOSCOPY     esophagus stretched   NASAL SEPTUM SURGERY     thumb surgery Left    joint replaced   WRIST SURGERY Left     Social History  reports that she has never smoked. She has never used smokeless tobacco. She reports that she does not drink alcohol and does not use drugs.  Allergies  Allergen Reactions   Ampicillin Hives and Nausea And Vomiting    Has patient had a PCN reaction causing immediate rash, facial/tongue/throat swelling, SOB or lightheadedness with hypotension: No Has patient had a PCN reaction causing severe rash involving mucus membranes or skin necrosis: No Has patient  had a PCN reaction that required hospitalization No Has patient had a PCN reaction occurring within the last 10 years: No If all of the above answers are "NO", then may proceed with Cephalosporin use.   Codeine Nausea And Vomiting   Crestor [Rosuvastatin] Other (See Comments)    Pain in joints   Lipitor [Atorvastatin]     elevated liver enzymes   Nitroglycerin Other (See Comments)    Blood pressure dropped with nitroglycerin cream   Sulfa Antibiotics Hives   Adhesive [Tape] Rash    Family History  Problem Relation Age of Onset   Rheum arthritis Mother    Cancer Mother    Heart failure Father    Colitis Sister    Colitis Sister    Colon cancer Maternal Aunt    Colon polyps Neg Hx    Stomach cancer Neg Hx    Esophageal cancer Neg Hx   Reviewed on admission  Prior to Admission medications   Medication Sig Start Date End Date Taking? Authorizing Provider  alendronate (FOSAMAX) 70 MG tablet Take 70 mg by mouth once a week. Tuesdays 09/12/22  Yes [provider]  amLODipine (NORVASC) 2.5 MG tablet Take 2.5 mg by mouth daily. 09/09/22  Yes [provider]  Cyanocobalamin (VITAMIN B-12 PO) Take 1 tablet by mouth daily.   Yes [provider]  enoxaparin (LOVENOX) 40 MG/0.4ML injection Inject 0.4 mLs (40 mg total) into the skin daily. 10/08/22 11/19/22 Yes Georgeanna Harrison, MD  gabapentin (NEURONTIN) 300 MG capsule Take 300 mg by mouth 2 (two) times daily.   Yes [provider]  HYDROcodone-acetaminophen (NORCO/VICODIN) 5-325 MG tablet Take 1 tablet by mouth every 4 (four) hours as needed for severe pain (POSTOPERATIVE PAIN). 10/08/22 10/08/23 Yes Georgeanna Harrison, MD  omeprazole (PRILOSEC) 40 MG capsule Take 40 mg by mouth daily.   Yes [provider]  tiZANidine (ZANAFLEX) 4 MG tablet Take 4 mg by mouth at bedtime.   Yes [provider]    Physical Exam: Vitals:   10/09/22 0000 10/09/22 0449 10/09/22 0857 10/09/22 1213  BP: 118/77 (!)  130/91 (!) 137/93 117/81  Pulse: 96 (!) 109 (!) 101 100  Resp: '18 20 18 18  '$ Temp: 98.3 F (36.8 C)  97.6 F (36.4 C) 97.7 F (36.5 C)  TempSrc: Oral     SpO2: 94% 97% 98% 100%  Weight:      Height:        Physical Exam Constitutional:      General: She is not in acute distress.    Appearance: Normal appearance.  HENT:     Head: Normocephalic and atraumatic.     Mouth/Throat:     Mouth: Mucous membranes are moist.     Pharynx: Oropharynx is clear.  Eyes:     Extraocular Movements: Extraocular movements intact.  Pupils: Pupils are equal, round, and reactive to light.  Cardiovascular:     Rate and Rhythm: Normal rate and regular rhythm.     Pulses: Normal pulses.     Heart sounds: Normal heart sounds.  Pulmonary:     Effort: Pulmonary effort is normal. No respiratory distress.     Breath sounds: Normal breath sounds.  Abdominal:     General: Bowel sounds are normal. There is no distension.     Palpations: Abdomen is soft.     Tenderness: There is no abdominal tenderness.  Musculoskeletal:        General: No swelling or deformity.     Comments: Is operative repair of right femur.  Skin:    General: Skin is warm and dry.  Neurological:     General: No focal deficit present.     Mental Status: Mental status is at baseline.    Labs on Admission: I have personally reviewed following labs and imaging studies  CBC: Recent Labs  Lab 10/08/22 1518 10/08/22 1536  WBC 11.5*  --   HGB 13.4 14.3  HCT 42.8 42.0  MCV 87.7  --   PLT 202  --     Basic Metabolic Panel: Recent Labs  Lab 10/08/22 1518 10/08/22 1536  NA 138 141  K 2.9* 3.0*  CL 106 105  CO2 21*  --   GLUCOSE 115* 114*  BUN 13 14  CREATININE 0.68 0.50  CALCIUM 9.1  --     GFR: Estimated Creatinine Clearance: 36.2 mL/min (by C-G formula based on SCr of 0.5 mg/dL).  Liver Function Tests: Recent Labs  Lab 10/08/22 1518  AST 23  ALT 11  ALKPHOS 62  BILITOT 0.8  PROT 7.5  ALBUMIN 3.9     Urine analysis:    Component Value Date/Time   COLORURINE YELLOW 04/16/2016 1019   APPEARANCEUR CLOUDY (A) 04/16/2016 1019   LABSPEC 1.024 04/16/2016 1019   PHURINE 6.0 04/16/2016 1019   GLUCOSEU NEGATIVE 04/16/2016 1019   HGBUR NEGATIVE 04/16/2016 1019   BILIRUBINUR NEGATIVE 04/16/2016 1019   KETONESUR NEGATIVE 04/16/2016 1019   PROTEINUR NEGATIVE 04/16/2016 1019   NITRITE NEGATIVE 04/16/2016 1019   LEUKOCYTESUR NEGATIVE 04/16/2016 1019    Radiological Exams on Admission: DG FEMUR, MIN 2 VIEWS RIGHT  Result Date: 10/09/2022 CLINICAL DATA:  Postop EXAM: RIGHT FEMUR 2 VIEWS COMPARISON:  10/08/2022 FINDINGS: Internal fixation across the proximal right femoral shaft fracture. Anatomic alignment. No hardware or bony complicating feature. IMPRESSION: Internal fixation.  No complicating feature. Electronically Signed   By: Rolm Baptise M.D.   On: 10/09/2022 01:11   DG FEMUR, MIN 2 VIEWS RIGHT  Result Date: 10/08/2022 CLINICAL DATA:  Right femoral intramedullary nail. EXAM: RIGHT FEMUR 2 VIEWS COMPARISON:  10/08/2022. FINDINGS: Seven fluoroscopic images were obtained intraoperatively. Fluoroscopy time is 157 seconds. Dose: 9.93 mGy. There is redemonstration of a fracture of the mid right femur with interval placement of intramedullary rod and nail. Alignment is improved. Please see operative report for additional information. IMPRESSION: Intraoperative utilization of fluoroscopy. Electronically Signed   By: Brett Fairy M.D.   On: 10/08/2022 22:38   DG C-Arm 1-60 Min-No Report  Result Date: 10/08/2022 Fluoroscopy was utilized by the requesting physician.  No radiographic interpretation.   DG C-Arm 1-60 Min-No Report  Result Date: 10/08/2022 Fluoroscopy was utilized by the requesting physician.  No radiographic interpretation.   DG Wrist Complete Right  Result Date: 10/08/2022 CLINICAL DATA:  Trauma EXAM: RIGHT WRIST - COMPLETE  3+ VIEW COMPARISON:  None Available. FINDINGS: Frontal,  oblique, lateral views of the right wrist are obtained. There is an impacted fracture of the distal right radial metaphysis, with slight dorsal angulation at the fracture site. I do not see any definite intra-articular extension. There is also an impacted distal right ulnar metaphyseal fracture, with grossly normal anatomic alignment. Widening of the scapholunate interval is identified measuring up to 5 mm, which could reflect acute ligamentous injury, sequela from previous trauma, or chronic degenerative change. Mild proximal migration of the capitate suggests more chronic injury. The remainder of the right wrist is unremarkable. Diffuse soft tissue swelling is noted. IMPRESSION: 1. Acute impacted distal right radial and ulnar metaphyseal fractures as above. 2. Widened scapholunate interval, mild proximal migration of the capitate. Favor sequela of chronic or degenerative change rather than acute injury. If there is focal tenderness at the anatomic snuff box, ulnar deviated views may be useful to better assess the scaphoid. 3. Diffuse soft tissue swelling. Electronically Signed   By: Randa Ngo M.D.   On: 10/08/2022 15:43   DG Chest Port 1 View  Result Date: 10/08/2022 CLINICAL DATA:  Trauma EXAM: PORTABLE CHEST 1 VIEW COMPARISON:  None Available. FINDINGS: No pleural effusion. No pneumothorax. No focal airspace opacity. No radiographically apparent displaced rib fracture. Normal cardiac and mediastinal contours. 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 Surgical clips in the right upper quadrant. IMPRESSION: 1. No active disease. 2. 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 Electronically Signed   By: Marin Roberts M.D.   On: 10/08/2022 15:41   DG Pelvis Portable  Result Date: 10/08/2022 CLINICAL DATA:  Trauma EXAM: PORTABLE PELVIS 1-2 VIEWS COMPARISON:  07/23/2021 FINDINGS: Two frontal views of the pelvis are obtained. Portions of the right iliac crest are excluded by collimation.  There is a transverse fracture through the proximal right femoral diaphysis with varus angulation at the fracture site. There are no other acute displaced pelvic fractures. 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 Sacroiliac joints are unremarkable. Soft tissue swelling proximal right thigh. IMPRESSION: 1. Displaced proximal right femoral diaphyseal fracture, with varus angulation. 2. No other acute pelvic fractures. 3. Bilateral hip osteoarthritis, left greater than right. Electronically Signed   By: Randa Ngo M.D.   On: 10/08/2022 15:41   DG Femur Portable 1 View Right  Result Date: 10/08/2022 CLINICAL DATA:  Trauma EXAM: RIGHT FEMUR PORTABLE 1 VIEW COMPARISON:  None Available. FINDINGS: Two frontal views of the right femur are obtained. There is a transverse fracture of the proximal right femoral diaphysis, with varus angulation at the fracture site. The right hip and right knee remain in anatomic alignment. Soft tissue swelling of the proximal right thigh. IMPRESSION: 1. Displaced proximal right femoral diaphyseal fracture as above. Electronically Signed   By: Randa Ngo M.D.   On: 10/08/2022 15:40    EKG: Not performed in the emergency department  Assessment/Plan Principal Problem:   Femur fracture [facility] Active Problems:   GERD (gastroesophageal reflux disease)   Scoliosis, or kyphoscoliosis, idiopathic   Fall at home, initial encounter   Osteoporosis   Hypokalemia   Back pain   Femur fracture Fall Osteoporosis > Patient with known history of osteoporosis had a mechanical fall prior to admission.  Her leg gave out and she collapsed and heard a crack at her right thigh and also landed on her right wrist. > Imaging revealed right-sided diaphyseal fracture of the femur as well as right radial and ulnar  fractures..  Was noted to have mild leukocytosis which is presumed to be reactive. > Patient was evaluated by orthopedics and underwent successful operative  repair fracture last night.  Admit order appears to have been placed yesterday evening by RN per chart review, but unclear if there was verbal cosign given.  In either case it was intended for hospital service to be consulted for admission and orthopedics is requesting [place] to take over as primary today.   - Appreciate orthopedics recommendations - PT/OT and TOC already consulted - Pain control as needed - Status post Tranexamic Acid - Continue Lovenox - Patient awaiting CT cervical spine for clearance and cervical collar removal. - Repeat CBC  Hypokalemia > Noted to have potassium of 2.9 in ED.  Has received 10 mill equivalents IV potassium and 40 mill equivalents p.o. potassium yesterday.  This has not yet been rechecked. - Recheck potassium now and trend - Check magnesium  GERD - Continue home PPI  Hypertension > Is on only low-dose of amlodipine and has some intermittent low normal blood pressures while here. - Hold home amlodipine for now  Chronic pain Scoliosis - Continue home Gabapentin  DVT prophylaxis: Lovenox Code Status:   Full Family Communication:  None on admission. Disposition Plan:   Patient is from:  Home  Anticipated DC to:  SNF  Anticipated DC date:  2 to 3 days  Anticipated DC barriers: Placement  Consults called:  Orthopedics Admission status:  Inpatient, MedSurg  Severity of Illness: The appropriate patient status for this patient is INPATIENT. Inpatient status is judged to be reasonable and necessary in order to provide the required intensity of service to ensure the patient's safety. The patient's presenting symptoms, physical exam findings, and initial radiographic and laboratory data in the context of their chronic comorbidities is felt to place them at high risk for further clinical deterioration. Furthermore, it is not anticipated that the patient will be medically stable for discharge from the hospital within 2 midnights of admission.   * I certify  that at the point of admission it is my clinical judgment that the patient will require inpatient hospital care spanning beyond 2 midnights from the point of admission due to high intensity of service, high risk for further deterioration and high frequency of surveillance required.Marcelyn Bruins MD [facility]  How to contact the [facility] Attending or Consulting provider [place] or covering provider during after hours [place], for this patient?   Check the care team in [facility] and look for a) attending/consulting TRH provider listed and b) the [facility] team listed Log into [email or web address] and use [place]'s universal password to access. If you do not have the password, please contact the hospital operator. Locate the [facility] provider you are looking for under [facility] and page to a number that you can be directly reached. If you still have difficulty reaching the provider, please page the [facility] (Director on Call) for the Hospitalists listed on amion for assistance.  10/09/2022, 1:41 PM

## 2022-10-09 NOTE — Progress Notes (Signed)
Orthopaedics Daily Progress Note   10/09/2022   12:49 PM  Valerie Hull is a 81 y.o. female 1 Day Post-Op s/p RIGHT INTRAMEDULLARY (IM) NAIL FEMORAL  Subjective Pain well controlled.  Denies nausea, vomiting, or fevers. Would very much like to have C collar removed.    Objective Vitals:   10/09/22 0857 10/09/22 1213  BP: (!) 137/93 117/81  Pulse: (!) 101 100  Resp: 18 18  Temp: 97.6 F (36.4 C) 97.7 F (36.5 C)  SpO2: 98% 100%    Intake/Output Summary (Last 24 hours) at 10/09/2022 1249 Last data filed at 10/09/2022 1443 Gross per 24 hour  Intake 1000 ml  Output 550 ml  Net 450 ml    Physical Exam RLE: Dressing clean, dry, and intact +DF/PF/EHL SILT SP/DP/T +DP/PT and WWP distally  Assessment 81 y.o. female s/p Procedure(s) (LRB): RIGHT INTRAMEDULLARY (IM) NAIL FEMORAL (Right)  Plan Currently there does not appear to be any record of evaluation from the medical service, this patient is supposed to be admitted to medicine primary.  We are looking into getting this resolved.  Additionally, she requires C-spine clearance to have the c-collar removed. Mobility: Out of bed with PT/OT Pain control: Continue to wean/titrate to appropriate oral regimen DVT Prophylaxis: Lovenox 40 mg daily x 6 weeks Further surgical plans: None RLE: Weightbearing as tolerated, no restrictions Dressing care: Keep AQUACEL on and dry for up to 14 days.  Do not allow surgical area to get wet before that.  Remove AQUACEL dressing after 14 days and allow area to get wet in shower but DO NOT SUBMERGE until wound is evaluated in clinic.  In most cases skin glue is used and no additional dressing is necessary.  Disposition: Per primary team is medically appropriate Follow-up: Please call [place] [phone]) to schedule follow-op appointment for 2 weeks after surgery.  I have verified that my discharge instructions and follow-up information have been entered in the  Discharge Navigator in Epic.  These should automatically populate in the AVS.  Please print the AVS in its entirety and ensure that the patient or a responsible party has a complete copy of the AVS before they are discharged.  If there are questions regarding discharge instructions or follow-up before the AVS is generated, please check the Discharge Navigator before attempting to contact the surgeon/office.  If unsure how to access the Discharge Navigator or the information contained in the Discharge Navigator, or how to generate/print the AVS, please contact the appropriate Nurse, learning disability.   The patient's postoperative prescriptions (e.g. pain medication, anticoagulation) have been printed, signed, and placed in/on the patient's hard chart: Lovenox and Norco    Georgeanna Harrison M.D. Orthopaedic Surgery [facility]

## 2022-10-10 DIAGNOSIS — S7221XA Displaced subtrochanteric fracture of right femur, initial encounter for closed fracture: Secondary | ICD-10-CM | POA: Diagnosis not present

## 2022-10-10 DIAGNOSIS — E872 Acidosis, unspecified: Secondary | ICD-10-CM | POA: Insufficient documentation

## 2022-10-10 LAB — LACTIC ACID, PLASMA: Lactic Acid, Venous: 1.2 mmol/L (ref 0.5–1.9)

## 2022-10-10 NOTE — Plan of Care (Signed)
  Problem: Education: Goal: Verbalization of understanding the information provided (i.e., activity precautions, restrictions, etc) will improve Outcome: Progressing   Problem: Activity: Goal: Ability to ambulate and perform ADLs will improve Outcome: Progressing   Problem: Clinical Measurements: Goal: Postoperative complications will be avoided or minimized Outcome: Progressing

## 2022-10-10 NOTE — TOC Initial Note (Addendum)
Transition of Care [facility]) - Initial/Assessment Note    Patient Details  Name: Valerie Hull MRN: [id number] Date of Birth: [birth year]  Transition of Care [facility]) CM/SW Contact:    Joanne Chars, LCSW Phone Number: 10/10/2022, 10:25 AM  Clinical Narrative:     CSW met with pt regarding DC recommendation for SNF.  Pt agreeable to this, has been to [place] in past and would like to return there.  Pt lives alone, no current services.  Permission given to speak with sister and brother in law, permission given to send out referral in hub.  Choice document also given.  Pt is vaccinated for covid with multiple boosters.  Referral sent out in hub, also reached out to [facility].             1150: [facility] does offer bed, includes $43 per day extra charge, discussed this with pt and she is agreeable including the extra charge.  Confirmed with Loree Fee that they can accept pt over the weekend.  Loree Fee is weekend contact, RN report [phone],  Expected Discharge Plan: Skilled Nursing Facility Barriers to Discharge: Continued Medical Work up, SNF Pending bed offer   Patient Goals and CMS Choice Patient states their goals for this hospitalization and ongoing recovery are:: be independent CMS Medicare.gov Compare Post Acute Care list provided to:: Patient Choice offered to / list presented to : Patient  Expected Discharge Plan and Services Expected Discharge Plan: [place] In-house Referral: Clinical Social Work   Post Acute Care Choice: [place] Living arrangements for the past 2 months: [place]                                      Prior Living Arrangements/Services Living arrangements for the past 2 months: Single Family Home Lives with:: Self Patient language and need for interpreter reviewed:: Yes Do you feel safe going back to the place where you live?: Yes      Need for Family Participation in Patient Care: Yes  (Comment) Care giver support system in place?: Yes (comment) Current home services: Other (comment) (none) Criminal Activity/Legal Involvement Pertinent to Current Situation/Hospitalization: No - Comment as needed  Activities of Daily Living Home Assistive Devices/Equipment: Walker (specify type) ADL Screening (condition at time of admission) Patient's cognitive ability adequate to safely complete daily activities?: Yes Is the patient deaf or have difficulty hearing?: No Does the patient have difficulty seeing, even when wearing glasses/contacts?: No Does the patient have difficulty concentrating, remembering, or making decisions?: No Patient able to express need for assistance with ADLs?: Yes Does the patient have difficulty dressing or bathing?: No Independently performs ADLs?: Yes (appropriate for developmental age) Does the patient have difficulty walking or climbing stairs?: Yes Weakness of Legs: Both Weakness of Arms/Hands: Both  Permission Sought/Granted Permission sought to share information with : Family Supports Permission granted to share information with : Yes, Verbal Permission Granted  Share Information with NAME: sister Hoyle Sauer and brother in Sports coach  Permission granted to share info w AGENCY: SNF        Emotional Assessment Appearance:: Appears stated age Attitude/Demeanor/Rapport: Engaged Affect (typically observed): Appropriate, Pleasant Orientation: : Oriented to Self, Oriented to Place, Oriented to  Time, Oriented to Situation      Admission diagnosis:  Closed fracture of shaft of right femur, unspecified fracture morphology, initial encounter ([place]) [S72.301A] Femur fracture ([place]) [S72.90XA] Patient Active Problem  List   Diagnosis Date Noted   Lactic acidosis 10/10/2022   Fall at home, initial encounter 10/09/2022   Osteoporosis 10/09/2022   Hypokalemia 10/09/2022   Back pain 10/09/2022   Femur fracture ([place]) 10/08/2022   Diverticulosis 11/06/2017   GERD  (gastroesophageal reflux disease) 11/06/2017   Scoliosis, or kyphoscoliosis, idiopathic 09/08/2016   PCP:  Kathyrn Lass, MD Pharmacy:   [facility] [id number], [place] [address] [place] [address] Phone: [phone] Fax: [phone]     Social Determinants of Health (SDOH) Interventions Housing Interventions: Patient Refused  Readmission Risk Interventions     No data to display

## 2022-10-10 NOTE — Progress Notes (Signed)
Pt refused gabapentin at bedtime, stated Phenergan drip made her extra sleepy and she did not wish to take anything additional that may make her sleepier, A. Zebedee Iba, NP notified.

## 2022-10-10 NOTE — Progress Notes (Signed)
PROGRESS NOTE    Valerie Hull  PJA:[id number] DOB: [birth year] DOA: 10/08/2022 PCP: Valerie Lass, MD   Brief Narrative:  HPI: Valerie Hull is a 81 y.o. female with medical history significant of hypertension, hyperlipidemia**, diverticulosis, GERD, scoliosis, IBS, osteoporosis presenting after fall.   Patient reports that her leg gave out while she was walking and she collapsed and fell.  She heard a crack at her right thigh and thinks she broke her leg.  She was unable to walk afterwards but was able to crawl to the phone and call 911.  She states she did not hit her head but she did land on her right wrist which hurt as well.   She denies fevers, chills, chest pain, shortness of breath, abdominal pain, constipation, diarrhea, nausea vomiting.   Today: Patient reporting some reflux symptoms and nausea postoperatively and due to her not receiving her PPI yet.  She was somewhat frustrated with the delay in her c-collar being cleared.  She is in a better mood when seen and is about to go get her CT scan done to clear her C-spine.  She was updated on the plan of care and has no questions.  Pain remains controlled.   ED/initial hospital course: Blood pressure thus far has been in the 341P to 379 systolic, heart rate in the 90s to 100s, at 1 point required 2 L but this appears to be perioperative currently on room air.  Lab workup included CMP with potassium 2.9, bicarb 21, glucose 115.  CBC with mild leukocytosis 11.5.  PT and INR normal.  Lactic acid borderline at 2.1.  Urinalysis pending.  Ethanol level negative.  Chest x-ray showed no acute abnormality.  Right wrist show distal radius and ulnar fracture.  X-ray of the pelvis and of the right femur showed right diaphyseal fracture.  Patient received supplemental potassium and pain medication in the ED.  Patient was subsequently seen by orthopedic surgery who took patient for surgical repair of this right femur.  This was repaired successfully.   During this initial period of evaluation and perioperative period no consult was placed to hospitalist service for evaluation.  This was noted this morning on rounds by the orthopedic service and they have requested [place] to take over as primary service with their continued consult.  Assessment & Plan:   Principal Problem:   Femur fracture ([place]) Active Problems:   GERD (gastroesophageal reflux disease)   Scoliosis, or kyphoscoliosis, idiopathic   Fall at home, initial encounter   Osteoporosis   Hypokalemia   Back pain   Lactic acidosis  Femur fracture secondary to mechanical fall fall with previous history of osteoporosis/right radial and ulnar fractures: > Imaging revealed right-sided diaphyseal fracture of the femur as well as right radial and ulnar fractures..  Was noted to have mild leukocytosis which is presumed to be reactive. > Patient was evaluated by orthopedics and underwent successful operative repair of right femur fracture on 10/08/2022.  She has cast in the right arm, orthopedics plans nonoperative treatment for that.  Patient's pain is controlled.  Seen by PT OT and they recommend SNF.  TOC is consulted.  Further management per orthopedics.   Hypokalemia: Resolved.   GERD: Continue home PPI   Essential hypertension: Is on only low-dose of amlodipine and has some intermittent low normal blood pressures while here. Hold home amlodipine for now   Chronic pain Scoliosis. Continue home Gabapentin  Lactic acidosis: Resolved.  DVT prophylaxis: enoxaparin (LOVENOX) injection 40 mg  Start: 10/09/22 1200 SCDs Start: 10/09/22 0029   Code Status: Full Code  Family Communication:  None present at bedside.  Plan of care discussed with patient in length and he/she verbalized understanding and agreed with it.  Status is: Inpatient Remains inpatient appropriate because: Pending placement to SNF.   Estimated body mass index is 21.95 kg/m as calculated from the following:   Height as  of this encounter: '4\' 10"'$  (1.473 m).   Weight as of this encounter: 47.6 kg.    Nutritional Assessment: Body mass index is 21.95 kg/m.Marland Kitchen Seen by dietician.  I agree with the assessment and plan as outlined below: Nutrition Status:        . Skin Assessment: I have examined the patient's skin and I agree with the wound assessment as performed by the wound care RN as outlined below:    Consultants:  Orthopedics  Procedures:  As above  Antimicrobials:  Anti-infectives (From admission, onward)    Start     Dose/Rate Route Frequency Ordered Stop   10/09/22 0600  ceFAZolin (ANCEF) IVPB 2g/100 mL premix        2 g 200 mL/hr over 30 Minutes Intravenous On call to O.R. 10/08/22 1902 10/08/22 2015   10/09/22 0028  ceFAZolin (ANCEF) IVPB 2g/100 mL premix        2 g 200 mL/hr over 30 Minutes Intravenous Every 6 hours 10/09/22 0029 10/09/22 1430   10/08/22 2040  vancomycin (VANCOCIN) powder  Status:  Discontinued          As needed 10/08/22 2041 10/08/22 2228   10/08/22 1851  ceFAZolin (ANCEF) 2-4 GM/100ML-% IVPB       Note to Pharmacy: Alphonsus Sias: cabinet override      10/08/22 1851 10/08/22 2032         Subjective: Seen and examined.  She feels better.  Pain is controlled.  Objective: Vitals:   10/09/22 2301 10/09/22 2303 10/10/22 0231 10/10/22 0926  BP: 134/80 134/80 134/80 107/77  Pulse: (!) 118 98 98 (!) 110  Resp: '16 16 16 20  '$ Temp:  97.7 F (36.5 C) 97.7 F (36.5 C) 98 F (36.7 C)  TempSrc:      SpO2: 96%   90%  Weight:      Height:        Intake/Output Summary (Last 24 hours) at 10/10/2022 0958 Last data filed at 10/09/2022 1800 Gross per 24 hour  Intake 50 ml  Output --  Net 50 ml   Filed Weights   10/08/22 1551  Weight: 47.6 kg    Examination:  General exam: Appears calm and comfortable  Respiratory system: Clear to auscultation. Respiratory effort normal. Cardiovascular system: S1 & S2 heard, RRR. No JVD, murmurs, rubs, gallops or clicks. No  pedal edema. Gastrointestinal system: Abdomen is nondistended, soft and nontender. No organomegaly or masses felt. Normal bowel sounds heard. Central nervous system: Alert and oriented. No focal neurological deficits. Extremities: Has cast in the right upper extremity.  Able to move fingers. Skin: No rashes, lesions or ulcers Psychiatry: Judgement and insight appear normal. Mood & affect appropriate.    Data Reviewed: I have personally reviewed following labs and imaging studies  CBC: Recent Labs  Lab 10/08/22 1518 10/08/22 1536 10/09/22 1435  WBC 11.5*  --  14.9*  HGB 13.4 14.3 11.3*  HCT 42.8 42.0 34.3*  MCV 87.7  --  85.1  PLT 202  --  884   Basic Metabolic Panel: Recent Labs  Lab 10/08/22 1518  10/08/22 1536 10/09/22 1435  NA 138 141 136  K 2.9* 3.0* 3.7  CL 106 105 107  CO2 21*  --  19*  GLUCOSE 115* 114* 115*  BUN '13 14 14  '$ CREATININE 0.68 0.50 0.69  CALCIUM 9.1  --  8.3*  MG  --   --  1.7   GFR: Estimated Creatinine Clearance: 36.2 mL/min (by C-G formula based on SCr of 0.69 mg/dL). Liver Function Tests: Recent Labs  Lab 10/08/22 1518  AST 23  ALT 11  ALKPHOS 62  BILITOT 0.8  PROT 7.5  ALBUMIN 3.9   No results for input(s): "LIPASE", "AMYLASE" in the last 168 hours. No results for input(s): "AMMONIA" in the last 168 hours. Coagulation Profile: Recent Labs  Lab 10/08/22 1518  INR 1.1   Cardiac Enzymes: No results for input(s): "CKTOTAL", "CKMB", "CKMBINDEX", "TROPONINI" in the last 168 hours. BNP (last 3 results) No results for input(s): "PROBNP" in the last 8760 hours. HbA1C: No results for input(s): "HGBA1C" in the last 72 hours. CBG: No results for input(s): "GLUCAP" in the last 168 hours. Lipid Profile: No results for input(s): "CHOL", "HDL", "LDLCALC", "TRIG", "CHOLHDL", "LDLDIRECT" in the last 72 hours. Thyroid Function Tests: No results for input(s): "TSH", "T4TOTAL", "FREET4", "T3FREE", "THYROIDAB" in the last 72 hours. Anemia  Panel: No results for input(s): "VITAMINB12", "FOLATE", "FERRITIN", "TIBC", "IRON", "RETICCTPCT" in the last 72 hours. Sepsis Labs: Recent Labs  Lab 10/08/22 1510 10/10/22 0831  LATICACIDVEN 2.1* 1.2    No results found for this or any previous visit (from the past 240 hour(s)).   Radiology Studies: CT CERVICAL SPINE WO CONTRAST  Result Date: 10/09/2022 CLINICAL DATA:  Fall EXAM: CT CERVICAL SPINE WITHOUT CONTRAST TECHNIQUE: Multidetector CT imaging of the cervical spine was performed without intravenous contrast. Multiplanar CT image reconstructions were also generated. RADIATION DOSE REDUCTION: This exam was performed according to the departmental dose-optimization program which includes automated exposure control, adjustment of the mA and/or kV according to patient size and/or use of iterative reconstruction technique. COMPARISON:  None Available. FINDINGS: Alignment: No listhesis. Skull base and vertebrae: No acute fracture. No primary bone lesion or focal pathologic process. Osseous fusion of C5 and C6 across the right aspect of the disc space and facets. Soft tissues and spinal canal: No prevertebral fluid or swelling. No visible canal hematoma. Disc levels: Multilevel degenerative changes, most prominent at C5-C6 and C6-C7. Upper chest: Aortic atherosclerosis. Right greater than left apical pleuroparenchymal scarring. Other: None. IMPRESSION: No acute fracture or traumatic malalignment. Aortic Atherosclerosis (ICD10-I70.0). Electronically Signed   By: Merilyn Baba M.D.   On: 10/09/2022 13:52   DG FEMUR, MIN 2 VIEWS RIGHT  Result Date: 10/09/2022 CLINICAL DATA:  Postop EXAM: RIGHT FEMUR 2 VIEWS COMPARISON:  10/08/2022 FINDINGS: Internal fixation across the proximal right femoral shaft fracture. Anatomic alignment. No hardware or bony complicating feature. IMPRESSION: Internal fixation.  No complicating feature. Electronically Signed   By: Rolm Baptise M.D.   On: 10/09/2022 01:11   DG  FEMUR, MIN 2 VIEWS RIGHT  Result Date: 10/08/2022 CLINICAL DATA:  Right femoral intramedullary nail. EXAM: RIGHT FEMUR 2 VIEWS COMPARISON:  10/08/2022. FINDINGS: Seven fluoroscopic images were obtained intraoperatively. Fluoroscopy time is 157 seconds. Dose: 9.93 mGy. There is redemonstration of a fracture of the mid right femur with interval placement of intramedullary rod and nail. Alignment is improved. Please see operative report for additional information. IMPRESSION: Intraoperative utilization of fluoroscopy. Electronically Signed   By: Regan Rakers.D.  On: 10/08/2022 22:38   DG C-Arm 1-60 Min-No Report  Result Date: 10/08/2022 Fluoroscopy was utilized by the requesting physician.  No radiographic interpretation.   DG C-Arm 1-60 Min-No Report  Result Date: 10/08/2022 Fluoroscopy was utilized by the requesting physician.  No radiographic interpretation.   DG Wrist Complete Right  Result Date: 10/08/2022 CLINICAL DATA:  Trauma EXAM: RIGHT WRIST - COMPLETE 3+ VIEW COMPARISON:  None Available. FINDINGS: Frontal, oblique, lateral views of the right wrist are obtained. There is an impacted fracture of the distal right radial metaphysis, with slight dorsal angulation at the fracture site. I do not see any definite intra-articular extension. There is also an impacted distal right ulnar metaphyseal fracture, with grossly normal anatomic alignment. Widening of the scapholunate interval is identified measuring up to 5 mm, which could reflect acute ligamentous injury, sequela from previous trauma, or chronic degenerative change. Mild proximal migration of the capitate suggests more chronic injury. The remainder of the right wrist is unremarkable. Diffuse soft tissue swelling is noted. IMPRESSION: 1. Acute impacted distal right radial and ulnar metaphyseal fractures as above. 2. Widened scapholunate interval, mild proximal migration of the capitate. Favor sequela of chronic or degenerative change rather  than acute injury. If there is focal tenderness at the anatomic snuff box, ulnar deviated views may be useful to better assess the scaphoid. 3. Diffuse soft tissue swelling. Electronically Signed   By: Randa Ngo M.D.   On: 10/08/2022 15:43   DG Chest Port 1 View  Result Date: 10/08/2022 CLINICAL DATA:  Trauma EXAM: PORTABLE CHEST 1 VIEW COMPARISON:  None Available. FINDINGS: No pleural effusion. No pneumothorax. No focal airspace opacity. No radiographically apparent displaced rib fracture. Normal cardiac and mediastinal contours. 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 Surgical clips in the right upper quadrant. IMPRESSION: 1. No active disease. 2. 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 Electronically Signed   By: Marin Roberts M.D.   On: 10/08/2022 15:41   DG Pelvis Portable  Result Date: 10/08/2022 CLINICAL DATA:  Trauma EXAM: PORTABLE PELVIS 1-2 VIEWS COMPARISON:  07/23/2021 FINDINGS: Two frontal views of the pelvis are obtained. Portions of the right iliac crest are excluded by collimation. There is a transverse fracture through the proximal right femoral diaphysis with varus angulation at the fracture site. There are no other acute displaced pelvic fractures. 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 Sacroiliac joints are unremarkable. Soft tissue swelling proximal right thigh. IMPRESSION: 1. Displaced proximal right femoral diaphyseal fracture, with varus angulation. 2. No other acute pelvic fractures. 3. Bilateral hip osteoarthritis, left greater than right. Electronically Signed   By: Randa Ngo M.D.   On: 10/08/2022 15:41   DG Femur Portable 1 View Right  Result Date: 10/08/2022 CLINICAL DATA:  Trauma EXAM: RIGHT FEMUR PORTABLE 1 VIEW COMPARISON:  None Available. FINDINGS: Two frontal views of the right femur are obtained. There is a transverse fracture of the proximal right femoral diaphysis, with varus angulation at the fracture site.  The right hip and right knee remain in anatomic alignment. Soft tissue swelling of the proximal right thigh. IMPRESSION: 1. Displaced proximal right femoral diaphyseal fracture as above. Electronically Signed   By: Randa Ngo M.D.   On: 10/08/2022 15:40    Scheduled Meds:  Chlorhexidine Gluconate Cloth  6 each Topical Daily   docusate sodium  100 mg Oral BID   enoxaparin (LOVENOX) injection  40 mg Subcutaneous Daily   gabapentin  300 mg Oral BID  pantoprazole  80 mg Oral Daily   potassium chloride  40 mEq Oral Once   Continuous Infusions:  promethazine (PHENERGAN) injection (IM or IVPB) 12.5 mg (10/09/22 1535)     LOS: 2 days   Darliss Cheney, MD [facility]  10/10/2022, 9:58 AM   *Please note that this is a verbal dictation therefore any spelling or grammatical errors are due to the "[place] [employer]" system interpretation.  Please page via [place] and do not message via secure chat for urgent patient care matters. Secure chat can be used for non urgent patient care matters.  How to contact the [facility] Attending or Consulting provider [place] or covering provider during after hours [place], for this patient?  Check the care team in [facility] and look for a) attending/consulting [facility] provider listed and b) the [facility] team listed. Page or secure chat [phone]. Log into [email or web address] and use [place]'s universal password to access. If you do not have the password, please contact the hospital operator. Locate the [facility] provider you are looking for under [facility] and page to a number that you can be directly reached. If you still have difficulty reaching the provider, please page the [facility] (Director on Call) for the Hospitalists listed on amion for assistance.

## 2022-10-10 NOTE — NC FL2 (Signed)
[place] LEVEL OF CARE FORM     IDENTIFICATION  Patient Name: Valerie Hull Birthdate: 08/16/1941 Sex: female Admission Date (Current Location): 10/08/2022  [facility] and [place] Number:  Herbalist and Address:  [facility] [place], [place] [address] [place], [place] [address]      Provider Number: [id number]  Attending Physician Name and Address:  Darliss Cheney, MD  Relative Name and Phone Number:  Lewis,Carolyn Sister [phone]    Current Level of Care: Hospital Recommended Level of Care: [place] Prior Approval Number:    Date Approved/Denied:   PASRR Number: [id number]  Discharge Plan: SNF    Current Diagnoses: Patient Active Problem List   Diagnosis Date Noted   Lactic acidosis 10/10/2022   Fall at home, initial encounter 10/09/2022   Osteoporosis 10/09/2022   Hypokalemia 10/09/2022   Back pain 10/09/2022   Femur fracture ([place]) 10/08/2022   Diverticulosis 11/06/2017   GERD (gastroesophageal reflux disease) 11/06/2017   Scoliosis, or kyphoscoliosis, idiopathic 09/08/2016    Orientation RESPIRATION BLADDER Height & Weight     Self, Time, Situation, Place  O2 Continent Weight: 105 lb (47.6 kg) Height:  '4\' 10"'$  (147.3 cm)  BEHAVIORAL SYMPTOMS/MOOD NEUROLOGICAL BOWEL NUTRITION STATUS      Continent Diet (see discharge summary)  AMBULATORY STATUS COMMUNICATION OF NEEDS Skin   Total Care Verbally Surgical wounds, Skin abrasions                       Personal Care Assistance Level of Assistance  Bathing, Feeding, Dressing Bathing Assistance: Maximum assistance Feeding assistance: Limited assistance Dressing Assistance: Maximum assistance     Functional Limitations Info  Sight, Hearing, Speech Sight Info: Adequate Hearing Info: Adequate Speech Info: Adequate    SPECIAL CARE FACTORS FREQUENCY  PT (By licensed PT), OT (By licensed OT)     PT Frequency: 5x week OT Frequency: 5x week             Contractures Contractures Info: Not present    Additional Factors Info  Code Status, Allergies Code Status Info: full Allergies Info: Ampicillin, Codeine, Crestor (Rosuvastatin), Lipitor (Atorvastatin), Nitroglycerin, Sulfa Antibiotics, Adhesive (Tape)           Current Medications (10/10/2022):  This is the current hospital active medication list Current Facility-Administered Medications  Medication Dose Route Frequency Provider Last Rate Last Admin   acetaminophen (TYLENOL) tablet 325-650 mg  325-650 mg Oral Q6H PRN Georgeanna Harrison, MD       Chlorhexidine Gluconate Cloth 2 % PADS 6 each  6 each Topical Daily Georgeanna Harrison, MD       docusate sodium (COLACE) capsule 100 mg  100 mg Oral BID Georgeanna Harrison, MD   100 mg at 10/09/22 2321   enoxaparin (LOVENOX) injection 40 mg  40 mg Subcutaneous Daily Georgeanna Harrison, MD   40 mg at 10/09/22 1308   gabapentin (NEURONTIN) capsule 300 mg  300 mg Oral BID Marcelyn Bruins, MD       HYDROcodone-acetaminophen (NORCO) 7.5-325 MG per tablet 1-2 tablet  1-2 tablet Oral Q4H PRN Georgeanna Harrison, MD       HYDROcodone-acetaminophen (NORCO/VICODIN) 5-325 MG per tablet 1-2 tablet  1-2 tablet Oral Q4H PRN Georgeanna Harrison, MD       menthol-cetylpyridinium (CEPACOL) lozenge 3 mg  1 lozenge Oral PRN Georgeanna Harrison, MD       Or   phenol (CHLORASEPTIC) mouth spray 1 spray  1 spray Mouth/Throat  PRN Georgeanna Harrison, MD       morphine (PF) 2 MG/ML injection 0.5-1 mg  0.5-1 mg Intravenous Q2H PRN Georgeanna Harrison, MD       ondansetron [facility]) tablet 4 mg  4 mg Oral Q6H PRN Georgeanna Harrison, MD       Or   ondansetron [facility]) injection 4 mg  4 mg Intravenous Q6H PRN Georgeanna Harrison, MD   4 mg at 10/09/22 1302   pantoprazole (PROTONIX) EC tablet 80 mg  80 mg Oral Daily Marcelyn Bruins, MD       potassium chloride SA (KLOR-CON M) CR tablet 40 mEq  40 mEq Oral Once Kingsley, Victoria K, DO       promethazine (PHENERGAN) 12.5 mg in sodium chloride 0.9 % 50 mL  IVPB  12.5 mg Intravenous Q6H PRN Lisette Abu, PA-C 200 mL/hr at 10/09/22 1535 12.5 mg at 10/09/22 1535     Discharge Medications: Please see discharge summary for a list of discharge medications.  Relevant Imaging Results:  Relevant Lab Results:   Additional Information SSN: [id number]. Pt is vaccincated for covid with multiple boosters  Joanne Chars, LCSW

## 2022-10-10 NOTE — Evaluation (Signed)
Occupational Therapy Evaluation Patient Details Name: Valerie Hull MRN: [id number] DOB: [birth year] Today's Date: 10/10/2022   History of Present Illness 81 yo female presents to [facility] on 12/6 with fall, pt sustaining R femoral shaft fracture and R nondisplaced impacted distal radius fracture. s/p IMN R femoral shaft fx on 12/6. PMH includes OA, osteoporosis, IBS, scoliosis.   Clinical Impression   Pt in bed upon therapy arrival and agreeable to participate in OT eval. Pt reports needing to urinate and although Purewick was not hooked up to wall suction. Pt was assisted to and from [facility] from bed to complete toileting. At baseline, pt is very independent and did not require any assistance to complete ADL tasks. Currently, she is experiencing increased pain in upper and lower right side extremities, decreased strength, activity tolerance, and endurance requiring increased physical assistance to complete all BADL tasks and functional transfers. At this time, recommend SNF at discharge to focus on mentioned deficits. OT will continue to follow patient acutely.       Recommendations for follow up therapy are one component of a multi-disciplinary discharge planning process, led by the attending physician.  Recommendations may be updated based on patient status, additional functional criteria and insurance authorization.   Follow Up Recommendations  Skilled nursing-short term rehab (<3 hours/day)     Assistance Recommended at Discharge Frequent or constant Supervision/Assistance  Patient can return home with the following Two people to help with walking and/or transfers;Two people to help with bathing/dressing/bathroom;Assistance with feeding;Assistance with cooking/housework;Help with stairs or ramp for entrance;Assist for transportation    Functional Status Assessment  Patient has had a recent decline in their functional status and demonstrates the ability to make significant improvements in  function in a reasonable and predictable amount of time.  Equipment Recommendations  Other (comment) (Defer to next venue of care)       Precautions / Restrictions Precautions Precautions: Fall;Other (comment) Precaution Comments: Right distal radius fx, Right femur fx Restrictions Weight Bearing Restrictions: Yes RUE Weight Bearing: Non weight bearing RLE Weight Bearing: Weight bearing as tolerated      Mobility Bed Mobility Overal bed mobility: Needs Assistance Bed Mobility: Supine to Sit, Sit to Supine     Supine to sit: Max assist, HOB elevated Sit to supine: Max assist     Patient Response: Cooperative  Transfers Overall transfer level: Needs assistance Equipment used: None, 1 person hand held assist Transfers: Sit to/from Stand, Bed to chair/wheelchair/BSC Sit to Stand: Max assist, From elevated surface   Squat pivot transfers: Max assist, From elevated surface       General transfer comment: VC for hand placement, WB restrictions for both upper and lower extremity, for sequencing and safety during transfer.      Balance Overall balance assessment: Needs assistance, History of Falls Sitting-balance support: Feet unsupported, No upper extremity supported Sitting balance-Leahy Scale: Fair Sitting balance - Comments: Required total assist for proper sitting positioning on EOB in order to sit unsupported.   Standing balance support: During functional activity, Single extremity supported Standing balance-Leahy Scale: Zero Standing balance comment: Sqt pvt transfer from bed to [facility] and back     ADL either performed or assessed with clinical judgement   ADL Overall ADL's : Needs assistance/impaired Eating/Feeding: Set up;Bed level   Grooming: Wash/dry hands;Wash/dry face;Oral care;Minimal assistance;Bed level   Upper Body Bathing: Minimal assistance;Sitting   Lower Body Bathing: Total assistance;Sitting/lateral leans   Upper Body Dressing : Minimal  assistance;Sitting   Lower Body Dressing:  Total assistance;Bed level   Toilet Transfer: Maximal assistance;Squat-pivot;BSC/3in1   Toileting- Clothing Manipulation and Hygiene: Total assistance;Sitting/lateral lean           Vision Baseline Vision/History: 0 No visual deficits Ability to See in Adequate Light: 0 Adequate Patient Visual Report: No change from baseline              Pertinent Vitals/Pain Pain Assessment Pain Assessment: Faces Faces Pain Scale: Hurts whole lot Pain Location: RLE and RUE with movement and mobility Pain Descriptors / Indicators: Sore, Discomfort, Grimacing, Guarding Pain Intervention(s): Limited activity within patient's tolerance, Monitored during session, Ice applied, Other (comment) (Pt encouraged to request pain meds from nursing)     Hand Dominance Right   Extremity/Trunk Assessment Upper Extremity Assessment Upper Extremity Assessment: RUE deficits/detail;LUE deficits/detail RUE Deficits / Details: Unable to fully assess due to WB status due to recent distal radius fracture and forearm cast. Able to wiggle fingers slightly. A/ROM shoulder flexion to approximately 90 degrees. RUE Coordination: decreased fine motor;decreased gross motor LUE Deficits / Details: Functional A/ROM and strength   Lower Extremity Assessment Lower Extremity Assessment: Defer to PT evaluation       Communication Communication Communication: No difficulties   Cognition Arousal/Alertness: Awake/alert Behavior During Therapy: Anxious Overall Cognitive Status: Within Functional Limits for tasks assessed       General Comments  VSS            Home Living Family/patient expects to be discharged to:: Private residence Living Arrangements: Alone   Type of Home: House Home Access: Stairs to enter [employer] of Steps: 2 Entrance Stairs-Rails: Left;Right Home Layout: One level     Bathroom Shower/Tub: Walk-in shower;Tub/shower unit    Bathroom Toilet: Handicapped height     Home Equipment: Conservation officer, nature (2 wheels);BSC/3in1          Prior Functioning/Environment Prior Level of Function : Independent/Modified Independent            OT Problem List: Decreased strength;Decreased activity tolerance;Impaired balance (sitting and/or standing);Pain;Impaired UE functional use;Decreased coordination;Decreased knowledge of precautions;Decreased knowledge of use of DME or AE      OT Treatment/Interventions: Self-care/ADL training;Modalities;Therapeutic exercise;Neuromuscular education;Therapeutic activities;Energy conservation;DME and/or AE instruction;Manual therapy;Patient/family education;Balance training    OT Goals(Current goals can be found in the care plan section)    OT Frequency: Min 2X/week       AM-PAC OT "6 Clicks" Daily Activity     Outcome Measure Help from another person eating meals?: A Little Help from another person taking care of personal grooming?: A Little Help from another person toileting, which includes using toliet, bedpan, or urinal?: Total Help from another person bathing (including washing, rinsing, drying)?: A Lot Help from another person to put on and taking off regular upper body clothing?: A Little Help from another person to put on and taking off regular lower body clothing?: Total 6 Click Score: 13   End of Session Equipment Utilized During Treatment: Gait belt Nurse Communication: Other (comment) (request for new Purewick)  Activity Tolerance: Patient tolerated treatment well;Patient limited by pain Patient left: in bed;with call bell/phone within reach;with bed alarm set  OT Visit Diagnosis: Unsteadiness on feet (R26.81);History of falling (Z91.81);Muscle weakness (generalized) (M62.81)                Time: 7829-5621 OT Time Calculation (min): 59 min Charges:  OT General Charges $OT Visit: 1 Visit OT Evaluation $OT Eval High Complexity: 1 High   Ailene Ravel,  OTR/L,CBIS  Supplemental  OT - [facility] and [facility]  10/10/2022, 1:02 PM

## 2022-10-10 NOTE — Plan of Care (Signed)
  Problem: Activity: Goal: Ability to ambulate and perform ADLs will improve Outcome: Progressing

## 2022-10-10 NOTE — Care Management Important Message (Signed)
Important Message  Patient Details  Name: Valerie Hull MRN: [id number] Date of Birth: [birth year]   Medicare Important Message Given:  Yes     Hannah Beat 10/10/2022, 2:44 PM

## 2022-10-10 NOTE — Progress Notes (Signed)
Orthopaedics Daily Progress Note   10/10/2022   12:58 PM  Valerie Hull is a 81 y.o. female 2 Days Post-Op s/p RIGHT INTRAMEDULLARY (IM) NAIL FEMORAL  Subjective Pain well controlled.  C collar off.  Resting comfortably.  Objective Vitals:   10/10/22 0231 10/10/22 0926  BP: 134/80 107/77  Pulse: 98 (!) 110  Resp: 16 20  Temp: 97.7 F (36.5 C) 98 F (36.7 C)  SpO2:  90%    Intake/Output Summary (Last 24 hours) at 10/10/2022 1258 Last data filed at 10/09/2022 1800 Gross per 24 hour  Intake 50 ml  Output --  Net 50 ml     Physical Exam NLB Resting comfortably RLE: Dressing clean, dry, and intact +DP/PT and WWP distally  Assessment 81 y.o. female s/p Procedure(s) (LRB): RIGHT INTRAMEDULLARY (IM) NAIL FEMORAL (Right)  Plan Currently there does not appear to be any record of evaluation from the medical service, this patient is supposed to be admitted to medicine primary.  We are looking into getting this resolved.  Additionally, she requires C-spine clearance to have the c-collar removed. Mobility: Out of bed with PT/OT Pain control: Continue to wean/titrate to appropriate oral regimen DVT Prophylaxis: Lovenox 40 mg daily x 6 weeks Further surgical plans: None RLE: Weightbearing as tolerated, no restrictions Dressing care: Keep AQUACEL on and dry for up to 14 days.  Do not allow surgical area to get wet before that.  Remove AQUACEL dressing after 14 days and allow area to get wet in shower but DO NOT SUBMERGE until wound is evaluated in clinic.  In most cases skin glue is used and no additional dressing is necessary.  Disposition: Per primary team is medically appropriate Follow-up: Please call [facility] [phone]) to schedule follow-op appointment for 2 weeks after surgery.  I have verified that my discharge instructions and follow-up information have been entered in the Discharge Navigator in Epic.  These should automatically  populate in the AVS.  Please print the AVS in its entirety and ensure that the patient or a responsible party has a complete copy of the AVS before they are discharged.  If there are questions regarding discharge instructions or follow-up before the AVS is generated, please check the Discharge Navigator before attempting to contact the surgeon/office.  If unsure how to access the Discharge Navigator or the information contained in the Discharge Navigator, or how to generate/print the AVS, please contact the appropriate Nurse, learning disability.   The patient's postoperative prescriptions (e.g. pain medication, anticoagulation) have been printed, signed, and placed in/on the patient's hard chart: Lovenox and Norco    Georgeanna Harrison M.D. Orthopaedic Surgery [facility]

## 2022-10-11 DIAGNOSIS — D500 Iron deficiency anemia secondary to blood loss (chronic): Secondary | ICD-10-CM | POA: Diagnosis not present

## 2022-10-11 DIAGNOSIS — R531 Weakness: Secondary | ICD-10-CM | POA: Diagnosis not present

## 2022-10-11 DIAGNOSIS — M1990 Unspecified osteoarthritis, unspecified site: Secondary | ICD-10-CM | POA: Diagnosis not present

## 2022-10-11 DIAGNOSIS — K589 Irritable bowel syndrome without diarrhea: Secondary | ICD-10-CM | POA: Diagnosis not present

## 2022-10-11 DIAGNOSIS — E876 Hypokalemia: Secondary | ICD-10-CM | POA: Diagnosis not present

## 2022-10-11 DIAGNOSIS — M419 Scoliosis, unspecified: Secondary | ICD-10-CM | POA: Diagnosis not present

## 2022-10-11 DIAGNOSIS — M810 Age-related osteoporosis without current pathological fracture: Secondary | ICD-10-CM | POA: Diagnosis not present

## 2022-10-11 DIAGNOSIS — M519 Unspecified thoracic, thoracolumbar and lumbosacral intervertebral disc disorder: Secondary | ICD-10-CM | POA: Diagnosis not present

## 2022-10-11 DIAGNOSIS — M8000XD Age-related osteoporosis with current pathological fracture, unspecified site, subsequent encounter for fracture with routine healing: Secondary | ICD-10-CM | POA: Diagnosis not present

## 2022-10-11 DIAGNOSIS — D649 Anemia, unspecified: Secondary | ICD-10-CM | POA: Diagnosis not present

## 2022-10-11 DIAGNOSIS — S72301D Unspecified fracture of shaft of right femur, subsequent encounter for closed fracture with routine healing: Secondary | ICD-10-CM | POA: Diagnosis not present

## 2022-10-11 DIAGNOSIS — E872 Acidosis, unspecified: Secondary | ICD-10-CM | POA: Diagnosis not present

## 2022-10-11 DIAGNOSIS — G4700 Insomnia, unspecified: Secondary | ICD-10-CM | POA: Diagnosis not present

## 2022-10-11 DIAGNOSIS — Z9181 History of falling: Secondary | ICD-10-CM | POA: Diagnosis not present

## 2022-10-11 DIAGNOSIS — E538 Deficiency of other specified B group vitamins: Secondary | ICD-10-CM | POA: Diagnosis not present

## 2022-10-11 DIAGNOSIS — S62101D Fracture of unspecified carpal bone, right wrist, subsequent encounter for fracture with routine healing: Secondary | ICD-10-CM | POA: Diagnosis not present

## 2022-10-11 DIAGNOSIS — W19XXXD Unspecified fall, subsequent encounter: Secondary | ICD-10-CM | POA: Diagnosis not present

## 2022-10-11 DIAGNOSIS — K219 Gastro-esophageal reflux disease without esophagitis: Secondary | ICD-10-CM | POA: Diagnosis not present

## 2022-10-11 DIAGNOSIS — K649 Unspecified hemorrhoids: Secondary | ICD-10-CM | POA: Diagnosis not present

## 2022-10-11 DIAGNOSIS — M25531 Pain in right wrist: Secondary | ICD-10-CM | POA: Diagnosis not present

## 2022-10-11 DIAGNOSIS — M80051A Age-related osteoporosis with current pathological fracture, right femur, initial encounter for fracture: Secondary | ICD-10-CM | POA: Diagnosis not present

## 2022-10-11 DIAGNOSIS — W19XXXA Unspecified fall, initial encounter: Secondary | ICD-10-CM | POA: Diagnosis not present

## 2022-10-11 DIAGNOSIS — Z7401 Bed confinement status: Secondary | ICD-10-CM | POA: Diagnosis not present

## 2022-10-11 DIAGNOSIS — I10 Essential (primary) hypertension: Secondary | ICD-10-CM | POA: Diagnosis not present

## 2022-10-11 DIAGNOSIS — S7221XA Displaced subtrochanteric fracture of right femur, initial encounter for closed fracture: Secondary | ICD-10-CM | POA: Diagnosis not present

## 2022-10-11 LAB — CBC WITH DIFFERENTIAL/PLATELET
Abs Immature Granulocytes: 0.05 10*3/uL (ref 0.00–0.07)
Basophils Absolute: 0 10*3/uL (ref 0.0–0.1)
Basophils Relative: 0 %
Eosinophils Absolute: 0.2 10*3/uL (ref 0.0–0.5)
Eosinophils Relative: 2 %
HCT: 28.1 % — ABNORMAL LOW (ref 36.0–46.0)
Hemoglobin: 9 g/dL — ABNORMAL LOW (ref 12.0–15.0)
Immature Granulocytes: 1 %
Lymphocytes Relative: 18 %
Lymphs Abs: 1.9 10*3/uL (ref 0.7–4.0)
MCH: 28.2 pg (ref 26.0–34.0)
MCHC: 32 g/dL (ref 30.0–36.0)
MCV: 88.1 fL (ref 80.0–100.0)
Monocytes Absolute: 1 10*3/uL (ref 0.1–1.0)
Monocytes Relative: 9 %
Neutro Abs: 7.3 10*3/uL (ref 1.7–7.7)
Neutrophils Relative %: 70 %
Platelets: 186 10*3/uL (ref 150–400)
RBC: 3.19 MIL/uL — ABNORMAL LOW (ref 3.87–5.11)
RDW: 14.6 % (ref 11.5–15.5)
WBC: 10.5 10*3/uL (ref 4.0–10.5)
nRBC: 0 % (ref 0.0–0.2)

## 2022-10-11 LAB — CBC
HCT: 29 % — ABNORMAL LOW (ref 36.0–46.0)
Hemoglobin: 9.7 g/dL — ABNORMAL LOW (ref 12.0–15.0)
MCH: 28.7 pg (ref 26.0–34.0)
MCHC: 33.4 g/dL (ref 30.0–36.0)
MCV: 85.8 fL (ref 80.0–100.0)
Platelets: 208 10*3/uL (ref 150–400)
RBC: 3.38 MIL/uL — ABNORMAL LOW (ref 3.87–5.11)
RDW: 14.6 % (ref 11.5–15.5)
WBC: 10.1 10*3/uL (ref 4.0–10.5)
nRBC: 0 % (ref 0.0–0.2)

## 2022-10-11 LAB — BASIC METABOLIC PANEL
Anion gap: 11 (ref 5–15)
BUN: 19 mg/dL (ref 8–23)
CO2: 20 mmol/L — ABNORMAL LOW (ref 22–32)
Calcium: 8.2 mg/dL — ABNORMAL LOW (ref 8.9–10.3)
Chloride: 107 mmol/L (ref 98–111)
Creatinine, Ser: 0.63 mg/dL (ref 0.44–1.00)
GFR, Estimated: >60 mL/min (ref >60)
Glucose, Bld: 89 mg/dL (ref 70–99)
Potassium: 3.5 mmol/L (ref 3.5–5.1)
Sodium: 138 mmol/L (ref 135–145)

## 2022-10-11 NOTE — Progress Notes (Addendum)
CSW spoke with Whitney at [facility] who states patient can be accepted into the facility today.  Patient will go to room 111. Patient will be transported via [facility] - RN to call when ready. The number to call for report is [phone]. CSW informed RN of information.  Discharge packet complete and left with unit secretary.  Madilyn Fireman, MSW, LCSW Transitions of Care  Clinical Social Worker II [phone]

## 2022-10-11 NOTE — Discharge Summary (Signed)
Patient ID: BIRDELL FRASIER MRN: [id number] DOB/AGE: [birth year] 81 y.o.  Admit date: 10/08/2022 Discharge date: 10/11/2022  Admission Diagnoses:  Principal Problem:   Femur fracture ([place]) Active Problems:   GERD (gastroesophageal reflux disease)   Scoliosis, or kyphoscoliosis, idiopathic   Fall at home, initial encounter   Osteoporosis   Hypokalemia   Back pain   Lactic acidosis   Discharge Diagnoses:  Same  Past Medical History:  Diagnosis Date   Acid reflux    Arthritis    Colon polyps    DJD (degenerative joint disease)    Hemorrhoids    History of hiatal hernia    Hypercholesteremia    IBS (irritable bowel syndrome)    Osteoporosis    PONV (postoperative nausea and vomiting)    Scoliosis     Surgeries: Procedure(s): RIGHT INTRAMEDULLARY (IM) NAIL FEMORAL on 10/08/2022   Consultants: Treatment Team:  Georgeanna Harrison, MD  Discharged Condition: Improved  Hospital Course: Valerie Hull is an 81 y.o. female who was admitted 10/08/2022 for operative treatment of Femur fracture ([place]). Patient has severe unremitting pain that affects sleep, daily activities, and work/hobbies. After pre-op clearance the patient was taken to the operating room on 10/08/2022 and underwent  Procedure(s): RIGHT INTRAMEDULLARY (IM) NAIL FEMORAL.    Patient was given perioperative antibiotics:  Anti-infectives (From admission, onward)    Start     Dose/Rate Route Frequency Ordered Stop   10/09/22 0600  ceFAZolin (ANCEF) IVPB 2g/100 mL premix        2 g 200 mL/hr over 30 Minutes Intravenous On call to O.R. 10/08/22 1902 10/08/22 2015   10/09/22 0028  ceFAZolin (ANCEF) IVPB 2g/100 mL premix        2 g 200 mL/hr over 30 Minutes Intravenous Every 6 hours 10/09/22 0029 10/09/22 1430   10/08/22 2040  vancomycin (VANCOCIN) powder  Status:  Discontinued          As needed 10/08/22 2041 10/08/22 2228   10/08/22 1851  ceFAZolin (ANCEF) 2-4 GM/100ML-% IVPB       Note to Pharmacy: Alphonsus Sias: cabinet override      10/08/22 1851 10/08/22 2032        Patient was given sequential compression devices, early ambulation to prevent DVT.  Patient benefited maximally from hospital stay and there were no complications.    Recent vital signs: Patient Vitals for the past 24 hrs:  BP Temp Temp src Pulse Resp SpO2  10/11/22 0421 126/79 97.9 F (36.6 C) Oral 99 19 93 %  10/10/22 2140 117/79 97.9 F (36.6 C) Oral (!) 105 19 92 %  10/10/22 1511 110/76 -- -- (!) 110 -- 92 %     Discharge Medications:   Allergies as of 10/11/2022       Reactions   Ampicillin Hives, Nausea And Vomiting   Has patient had a PCN reaction causing immediate rash, facial/tongue/throat swelling, SOB or lightheadedness with hypotension: No Has patient had a PCN reaction causing severe rash involving mucus membranes or skin necrosis: No Has patient had a PCN reaction that required hospitalization No Has patient had a PCN reaction occurring within the last 10 years: No If all of the above answers are "NO", then may proceed with Cephalosporin use.   Codeine Nausea And Vomiting   Crestor [rosuvastatin] Other (See Comments)   Pain in joints   Lipitor [atorvastatin]    elevated liver enzymes   Nitroglycerin Other (See Comments)   Blood pressure dropped with nitroglycerin  cream   Sulfa Antibiotics Hives   Adhesive [tape] Rash        Medication List     TAKE these medications    alendronate 70 MG tablet Commonly known as: FOSAMAX Take 70 mg by mouth once a week. Tuesdays   amLODipine 2.5 MG tablet Commonly known as: NORVASC Take 2.5 mg by mouth daily.   enoxaparin 40 MG/0.4ML injection Commonly known as: LOVENOX Inject 0.4 mLs (40 mg total) into the skin daily.   gabapentin 300 MG capsule Commonly known as: NEURONTIN Take 300 mg by mouth 2 (two) times daily.   HYDROcodone-acetaminophen 5-325 MG tablet Commonly known as: NORCO/VICODIN Take 1 tablet by mouth every 4 (four) hours as needed  for severe pain (POSTOPERATIVE PAIN).   omeprazole 40 MG capsule Commonly known as: PRILOSEC Take 40 mg by mouth daily.   tiZANidine 4 MG tablet Commonly known as: ZANAFLEX Take 4 mg by mouth at bedtime.   VITAMIN B-12 PO Take 1 tablet by mouth daily.        Diagnostic Studies: CT CERVICAL SPINE WO CONTRAST  Result Date: 10/09/2022 CLINICAL DATA:  Fall EXAM: CT CERVICAL SPINE WITHOUT CONTRAST TECHNIQUE: Multidetector CT imaging of the cervical spine was performed without intravenous contrast. Multiplanar CT image reconstructions were also generated. RADIATION DOSE REDUCTION: This exam was performed according to the departmental dose-optimization program which includes automated exposure control, adjustment of the mA and/or kV according to patient size and/or use of iterative reconstruction technique. COMPARISON:  None Available. FINDINGS: Alignment: No listhesis. Skull base and vertebrae: No acute fracture. No primary bone lesion or focal pathologic process. Osseous fusion of C5 and C6 across the right aspect of the disc space and facets. Soft tissues and spinal canal: No prevertebral fluid or swelling. No visible canal hematoma. Disc levels: Multilevel degenerative changes, most prominent at C5-C6 and C6-C7. Upper chest: Aortic atherosclerosis. Right greater than left apical pleuroparenchymal scarring. Other: None. IMPRESSION: No acute fracture or traumatic malalignment. Aortic Atherosclerosis (ICD10-I70.0). Electronically Signed   By: Merilyn Baba M.D.   On: 10/09/2022 13:52   DG FEMUR, MIN 2 VIEWS RIGHT  Result Date: 10/09/2022 CLINICAL DATA:  Postop EXAM: RIGHT FEMUR 2 VIEWS COMPARISON:  10/08/2022 FINDINGS: Internal fixation across the proximal right femoral shaft fracture. Anatomic alignment. No hardware or bony complicating feature. IMPRESSION: Internal fixation.  No complicating feature. Electronically Signed   By: Rolm Baptise M.D.   On: 10/09/2022 01:11   DG FEMUR, MIN 2 VIEWS  RIGHT  Result Date: 10/08/2022 CLINICAL DATA:  Right femoral intramedullary nail. EXAM: RIGHT FEMUR 2 VIEWS COMPARISON:  10/08/2022. FINDINGS: Seven fluoroscopic images were obtained intraoperatively. Fluoroscopy time is 157 seconds. Dose: 9.93 mGy. There is redemonstration of a fracture of the mid right femur with interval placement of intramedullary rod and nail. Alignment is improved. Please see operative report for additional information. IMPRESSION: Intraoperative utilization of fluoroscopy. Electronically Signed   By: Brett Fairy M.D.   On: 10/08/2022 22:38   DG C-Arm 1-60 Min-No Report  Result Date: 10/08/2022 Fluoroscopy was utilized by the requesting physician.  No radiographic interpretation.   DG C-Arm 1-60 Min-No Report  Result Date: 10/08/2022 Fluoroscopy was utilized by the requesting physician.  No radiographic interpretation.   DG Wrist Complete Right  Result Date: 10/08/2022 CLINICAL DATA:  Trauma EXAM: RIGHT WRIST - COMPLETE 3+ VIEW COMPARISON:  None Available. FINDINGS: Frontal, oblique, lateral views of the right wrist are obtained. There is an impacted fracture of the distal right radial  metaphysis, with slight dorsal angulation at the fracture site. I do not see any definite intra-articular extension. There is also an impacted distal right ulnar metaphyseal fracture, with grossly normal anatomic alignment. Widening of the scapholunate interval is identified measuring up to 5 mm, which could reflect acute ligamentous injury, sequela from previous trauma, or chronic degenerative change. Mild proximal migration of the capitate suggests more chronic injury. The remainder of the right wrist is unremarkable. Diffuse soft tissue swelling is noted. IMPRESSION: 1. Acute impacted distal right radial and ulnar metaphyseal fractures as above. 2. Widened scapholunate interval, mild proximal migration of the capitate. Favor sequela of chronic or degenerative change rather than acute injury.  If there is focal tenderness at the anatomic snuff box, ulnar deviated views may be useful to better assess the scaphoid. 3. Diffuse soft tissue swelling. Electronically Signed   By: Randa Ngo M.D.   On: 10/08/2022 15:43   DG Chest Port 1 View  Result Date: 10/08/2022 CLINICAL DATA:  Trauma EXAM: PORTABLE CHEST 1 VIEW COMPARISON:  None Available. FINDINGS: No pleural effusion. No pneumothorax. No focal airspace opacity. No radiographically apparent displaced rib fracture. Normal cardiac and mediastinal contours. 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 Surgical clips in the right upper quadrant. IMPRESSION: 1. No active disease. 2. 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 Electronically Signed   By: Marin Roberts M.D.   On: 10/08/2022 15:41   DG Pelvis Portable  Result Date: 10/08/2022 CLINICAL DATA:  Trauma EXAM: PORTABLE PELVIS 1-2 VIEWS COMPARISON:  07/23/2021 FINDINGS: Two frontal views of the pelvis are obtained. Portions of the right iliac crest are excluded by collimation. There is a transverse fracture through the proximal right femoral diaphysis with varus angulation at the fracture site. There are no other acute displaced pelvic fractures. 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 Sacroiliac joints are unremarkable. Soft tissue swelling proximal right thigh. IMPRESSION: 1. Displaced proximal right femoral diaphyseal fracture, with varus angulation. 2. No other acute pelvic fractures. 3. Bilateral hip osteoarthritis, left greater than right. Electronically Signed   By: Randa Ngo M.D.   On: 10/08/2022 15:41   DG Femur Portable 1 View Right  Result Date: 10/08/2022 CLINICAL DATA:  Trauma EXAM: RIGHT FEMUR PORTABLE 1 VIEW COMPARISON:  None Available. FINDINGS: Two frontal views of the right femur are obtained. There is a transverse fracture of the proximal right femoral diaphysis, with varus angulation at the fracture site. The right hip and  right knee remain in anatomic alignment. Soft tissue swelling of the proximal right thigh. IMPRESSION: 1. Displaced proximal right femoral diaphyseal fracture as above. Electronically Signed   By: Randa Ngo M.D.   On: 10/08/2022 15:40    Disposition: Discharge disposition: 03-Skilled Nursing Facility       Discharge Instructions     Discharge patient   Complete by: As directed    Pt cleared to go to [facility], bed reportedly waiting   Discharge disposition: 03-Skilled [facility]   Discharge patient date: 10/11/2022      Assessment -81 y.o. female POD #3 s/p R IM femoral nail by Dr. Mable Fill, doing well. Pt cleared for release to SNF and eager to go.   -R wrist fx splinted and minimal discomfort, non-op treatment. Non-weightbearing in splint until f/u   Plan  Mobility: Out of bed with PT/OT -WBAT RLE, NWB RUE Pain control: Continue to wean/titrate to appropriate oral regimen DVT Prophylaxis: Lovenox 40 mg daily x 6 weeks Further surgical plans:  None RLE: Weightbearing as tolerated, no restrictions Dressing care: Keep AQUACEL on and dry for up to 14 days.  Do not allow surgical area to get wet before that.  Remove AQUACEL dressing after 14 days and allow area to get wet in shower but DO NOT SUBMERGE until wound is evaluated in clinic.  In most cases skin glue is used and no additional dressing is necessary.  Disposition: D/C to SNF Follow-up: Please call [facility] [phone]) to schedule follow-op appointment for 2 weeks after surgery.   I have verified that my discharge instructions and follow-up information have been entered in the Discharge Navigator in Epic.  These should automatically populate in the AVS.  Please print the AVS in its entirety and ensure that the patient or a responsible party has a complete copy of the AVS before they are discharged.  If there are questions regarding discharge instructions or follow-up before the AVS  is generated, please check the Discharge Navigator before attempting to contact the surgeon/office.  If unsure how to access the Discharge Navigator or the information contained in the Discharge Navigator, or how to generate/print the AVS, please contact the appropriate Nurse, learning disability.    The patient's postoperative prescriptions (e.g. pain medication, anticoagulation) have been printed, signed, and placed in/on the patient's hard chart: Lovenox and Norco    Signed: Lennie Muckle Christmas Faraci 10/11/2022, 9:35 AM

## 2022-10-11 NOTE — Discharge Summary (Signed)
Physician Discharge Summary  Valerie Hull:[id number] DOB: 1940/12/07 DOA: 10/08/2022  PCP: Kathyrn Lass, MD  Admit date: 10/08/2022 Discharge date: 10/11/2022 30 Day Unplanned Readmission Risk Score    Flowsheet Row ED to Hosp-Admission (Current) from 10/08/2022 in [place]  30 Day Unplanned Readmission Risk Score (%) 11.19 Filed at 10/11/2022 0800       This score is the patient's risk of an unplanned readmission within 30 days of being discharged (0 -100%). The score is based on dignosis, age, lab data, medications, orders, and past utilization.   Low:  0-14.9   Medium: 15-21.9   High: 22-29.9   Extreme: 30 and above          Admitted From: Home Disposition:  SNF  Recommendations for Outpatient Follow-up:  Follow up with PCP in 1-2 weeks Please obtain BMP/CBC in one week Follow-up with orthopedics/Dr. Mable Fill in 2 weeks Please follow up with your PCP on the following pending results: Unresulted Labs (From admission, onward)     Start     Ordered   10/08/22 1929  Surgical pcr screen  Once,   R        10/08/22 1928   10/08/22 1510  Urinalysis, Routine w reflex microscopic  (Trauma Panel)  Once,   URGENT        10/08/22 [place]: None Equipment/Devices: None  Discharge Condition: Stable CODE STATUS: Full code Diet recommendation: Cardiac  Subjective: Seen and examined.  She is feeling well.  She has no complaints.  Brief/Interim Summary: HPI: Valerie Hull is a 81 y.o. female with medical history significant of hypertension, hyperlipidemia**, diverticulosis, GERD, scoliosis, IBS, osteoporosis presented after mechanical fall. She was unable to walk afterwards but was able to crawl to the phone and call 911.  She states she did not hit her head but she did land on her right wrist which hurt as well.  Upon arrival to ED, she was hemodynamically stable and was subsequently diagnosed with right distal  radius and ulna fracture as well as right femur fracture.  Admitted to hospital service, orthopedics consulted.  She underwent underwent successful operative repair of right femur fracture on 10/08/2022.  She has cast in the right arm, orthopedics plans nonoperative treatment for that.  Patient's pain is controlled.  Seen by PT OT and they recommend SNF.  She is cleared by orthopedics and she is being discharged to SNF in stable condition today.   Hypokalemia: Resolved.   GERD: Continue home PPI   Essential hypertension: Resume home medications.   Chronic pain Scoliosis. Continue home Gabapentin   Lactic acidosis: Resolved.  Discharge plan was discussed with patient and/or family member and they verbalized understanding and agreed with it.  Discharge Diagnoses:  Principal Problem:   Femur fracture ([place]) Active Problems:   GERD (gastroesophageal reflux disease)   Scoliosis, or kyphoscoliosis, idiopathic   Fall at home, initial encounter   Osteoporosis   Hypokalemia   Back pain   Lactic acidosis    Discharge Instructions  Discharge Instructions     Discharge patient   Complete by: As directed    Pt cleared to go to [facility], bed reportedly waiting   Discharge disposition: 03-Skilled Nursing Facility   Discharge patient date: 10/11/2022      Allergies as of 10/11/2022       Reactions   Ampicillin Hives, Nausea And Vomiting  Has patient had a PCN reaction causing immediate rash, facial/tongue/throat swelling, SOB or lightheadedness with hypotension: No Has patient had a PCN reaction causing severe rash involving mucus membranes or skin necrosis: No Has patient had a PCN reaction that required hospitalization No Has patient had a PCN reaction occurring within the last 10 years: No If all of the above answers are "NO", then may proceed with Cephalosporin use.   Codeine Nausea And Vomiting   Crestor [rosuvastatin] Other (See Comments)   Pain in joints   Lipitor  [atorvastatin]    elevated liver enzymes   Nitroglycerin Other (See Comments)   Blood pressure dropped with nitroglycerin cream   Sulfa Antibiotics Hives   Adhesive [tape] Rash        Medication List     TAKE these medications    alendronate 70 MG tablet Commonly known as: FOSAMAX Take 70 mg by mouth once a week. Tuesdays   amLODipine 2.5 MG tablet Commonly known as: NORVASC Take 2.5 mg by mouth daily.   enoxaparin 40 MG/0.4ML injection Commonly known as: LOVENOX Inject 0.4 mLs (40 mg total) into the skin daily.   gabapentin 300 MG capsule Commonly known as: NEURONTIN Take 300 mg by mouth 2 (two) times daily.   HYDROcodone-acetaminophen 5-325 MG tablet Commonly known as: NORCO/VICODIN Take 1 tablet by mouth every 4 (four) hours as needed for severe pain (POSTOPERATIVE PAIN).   omeprazole 40 MG capsule Commonly known as: PRILOSEC Take 40 mg by mouth daily.   tiZANidine 4 MG tablet Commonly known as: ZANAFLEX Take 4 mg by mouth at bedtime.   VITAMIN B-12 PO Take 1 tablet by mouth daily.        Follow-up Information     Georgeanna Harrison, MD Follow up in 2 week(s).   Specialty: Orthopedic Surgery Contact information: [place] [address] [phone]         Kathyrn Lass, MD Follow up in 1 week(s).   Specialty: Family Medicine Contact information: [place] [address] [phone]                Allergies  Allergen Reactions   Ampicillin Hives and Nausea And Vomiting    Has patient had a PCN reaction causing immediate rash, facial/tongue/throat swelling, SOB or lightheadedness with hypotension: No Has patient had a PCN reaction causing severe rash involving mucus membranes or skin necrosis: No Has patient had a PCN reaction that required hospitalization No Has patient had a PCN reaction occurring within the last 10 years: No If all of the above answers are "NO", then may proceed with Cephalosporin  use.   Codeine Nausea And Vomiting   Crestor [Rosuvastatin] Other (See Comments)    Pain in joints   Lipitor [Atorvastatin]     elevated liver enzymes   Nitroglycerin Other (See Comments)    Blood pressure dropped with nitroglycerin cream   Sulfa Antibiotics Hives   Adhesive [Tape] Rash    Consultations: Orthopedics   Procedures/Studies: CT CERVICAL SPINE WO CONTRAST  Result Date: 10/09/2022 CLINICAL DATA:  Fall EXAM: CT CERVICAL SPINE WITHOUT CONTRAST TECHNIQUE: Multidetector CT imaging of the cervical spine was performed without intravenous contrast. Multiplanar CT image reconstructions were also generated. RADIATION DOSE REDUCTION: This exam was performed according to the departmental dose-optimization program which includes automated exposure control, adjustment of the mA and/or kV according to patient size and/or use of iterative reconstruction technique. COMPARISON:  None Available. FINDINGS: Alignment: No listhesis. Skull base and  vertebrae: No acute fracture. No primary bone lesion or focal pathologic process. Osseous fusion of C5 and C6 across the right aspect of the disc space and facets. Soft tissues and spinal canal: No prevertebral fluid or swelling. No visible canal hematoma. Disc levels: Multilevel degenerative changes, most prominent at C5-C6 and C6-C7. Upper chest: Aortic atherosclerosis. Right greater than left apical pleuroparenchymal scarring. Other: None. IMPRESSION: No acute fracture or traumatic malalignment. Aortic Atherosclerosis (ICD10-I70.0). Electronically Signed   By: Merilyn Baba M.D.   On: 10/09/2022 13:52   DG FEMUR, MIN 2 VIEWS RIGHT  Result Date: 10/09/2022 CLINICAL DATA:  Postop EXAM: RIGHT FEMUR 2 VIEWS COMPARISON:  10/08/2022 FINDINGS: Internal fixation across the proximal right femoral shaft fracture. Anatomic alignment. No hardware or bony complicating feature. IMPRESSION: Internal fixation.  No complicating feature. Electronically Signed   By: Rolm Baptise M.D.   On: 10/09/2022 01:11   DG FEMUR, MIN 2 VIEWS RIGHT  Result Date: 10/08/2022 CLINICAL DATA:  Right femoral intramedullary nail. EXAM: RIGHT FEMUR 2 VIEWS COMPARISON:  10/08/2022. FINDINGS: Seven fluoroscopic images were obtained intraoperatively. Fluoroscopy time is 157 seconds. Dose: 9.93 mGy. There is redemonstration of a fracture of the mid right femur with interval placement of intramedullary rod and nail. Alignment is improved. Please see operative report for additional information. IMPRESSION: Intraoperative utilization of fluoroscopy. Electronically Signed   By: Brett Fairy M.D.   On: 10/08/2022 22:38   DG C-Arm 1-60 Min-No Report  Result Date: 10/08/2022 Fluoroscopy was utilized by the requesting physician.  No radiographic interpretation.   DG C-Arm 1-60 Min-No Report  Result Date: 10/08/2022 Fluoroscopy was utilized by the requesting physician.  No radiographic interpretation.   DG Wrist Complete Right  Result Date: 10/08/2022 CLINICAL DATA:  Trauma EXAM: RIGHT WRIST - COMPLETE 3+ VIEW COMPARISON:  None Available. FINDINGS: Frontal, oblique, lateral views of the right wrist are obtained. There is an impacted fracture of the distal right radial metaphysis, with slight dorsal angulation at the fracture site. I do not see any definite intra-articular extension. There is also an impacted distal right ulnar metaphyseal fracture, with grossly normal anatomic alignment. Widening of the scapholunate interval is identified measuring up to 5 mm, which could reflect acute ligamentous injury, sequela from previous trauma, or chronic degenerative change. Mild proximal migration of the capitate suggests more chronic injury. The remainder of the right wrist is unremarkable. Diffuse soft tissue swelling is noted. IMPRESSION: 1. Acute impacted distal right radial and ulnar metaphyseal fractures as above. 2. Widened scapholunate interval, mild proximal migration of the capitate. Favor sequela  of chronic or degenerative change rather than acute injury. If there is focal tenderness at the anatomic snuff box, ulnar deviated views may be useful to better assess the scaphoid. 3. Diffuse soft tissue swelling. Electronically Signed   By: Randa Ngo M.D.   On: 10/08/2022 15:43   DG Chest Port 1 View  Result Date: 10/08/2022 CLINICAL DATA:  Trauma EXAM: PORTABLE CHEST 1 VIEW COMPARISON:  None Available. FINDINGS: No pleural effusion. No pneumothorax. No focal airspace opacity. No radiographically apparent displaced rib fracture. Normal cardiac and mediastinal contours. 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 Surgical clips in the right upper quadrant. IMPRESSION: 1. No active disease. 2. 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 Electronically Signed   By: Marin Roberts M.D.   On: 10/08/2022 15:41   DG Pelvis Portable  Result Date: 10/08/2022 CLINICAL DATA:  Trauma EXAM: PORTABLE PELVIS 1-2 VIEWS COMPARISON:  07/23/2021 FINDINGS: Two  frontal views of the pelvis are obtained. Portions of the right iliac crest are excluded by collimation. There is a transverse fracture through the proximal right femoral diaphysis with varus angulation at the fracture site. There are no other acute displaced pelvic fractures. 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 Sacroiliac joints are unremarkable. Soft tissue swelling proximal right thigh. IMPRESSION: 1. Displaced proximal right femoral diaphyseal fracture, with varus angulation. 2. No other acute pelvic fractures. 3. Bilateral hip osteoarthritis, left greater than right. Electronically Signed   By: Randa Ngo M.D.   On: 10/08/2022 15:41   DG Femur Portable 1 View Right  Result Date: 10/08/2022 CLINICAL DATA:  Trauma EXAM: RIGHT FEMUR PORTABLE 1 VIEW COMPARISON:  None Available. FINDINGS: Two frontal views of the right femur are obtained. There is a transverse fracture of the proximal right femoral diaphysis, with  varus angulation at the fracture site. The right hip and right knee remain in anatomic alignment. Soft tissue swelling of the proximal right thigh. IMPRESSION: 1. Displaced proximal right femoral diaphyseal fracture as above. Electronically Signed   By: Randa Ngo M.D.   On: 10/08/2022 15:40     Discharge Exam: Vitals:   10/10/22 2140 10/11/22 0421  BP: 117/79 126/79  Pulse: (!) 105 99  Resp: 19 19  Temp: 97.9 F (36.6 C) 97.9 F (36.6 C)  SpO2: 92% 93%   Vitals:   10/10/22 0926 10/10/22 1511 10/10/22 2140 10/11/22 0421  BP: 107/77 110/76 117/79 126/79  Pulse: (!) 110 (!) 110 (!) 105 99  Resp: '20  19 19  '$ Temp: 98 F (36.7 C)  97.9 F (36.6 C) 97.9 F (36.6 C)  TempSrc:   Oral Oral  SpO2: 90% 92% 92% 93%  Weight:      Height:        General: Pt is alert, awake, not in acute distress Cardiovascular: RRR, S1/S2 +, no rubs, no gallops Respiratory: CTA bilaterally, no wheezing, no rhonchi Abdominal: Soft, NT, ND, bowel sounds + Extremities: no edema, no cyanosis    The results of significant diagnostics from this hospitalization (including imaging, microbiology, ancillary and laboratory) are listed below for reference.     Microbiology: No results found for this or any previous visit (from the past 240 hour(s)).   Labs: BNP (last 3 results) No results for input(s): "BNP" in the last 8760 hours. Basic Metabolic Panel: Recent Labs  Lab 10/08/22 1518 10/08/22 1536 10/09/22 1435 10/11/22 0233  NA 138 141 136 138  K 2.9* 3.0* 3.7 3.5  CL 106 105 107 107  CO2 21*  --  19* 20*  GLUCOSE 115* 114* 115* 89  BUN '13 14 14 19  '$ CREATININE 0.68 0.50 0.69 0.63  CALCIUM 9.1  --  8.3* 8.2*  MG  --   --  1.7  --    Liver Function Tests: Recent Labs  Lab 10/08/22 1518  AST 23  ALT 11  ALKPHOS 62  BILITOT 0.8  PROT 7.5  ALBUMIN 3.9   No results for input(s): "LIPASE", "AMYLASE" in the last 168 hours. No results for input(s): "AMMONIA" in the last 168  hours. CBC: Recent Labs  Lab 10/08/22 1518 10/08/22 1536 10/09/22 1435 10/11/22 0233 10/11/22 0825  WBC 11.5*  --  14.9* 10.5 10.1  NEUTROABS  --   --   --  7.3  --   HGB 13.4 14.3 11.3* 9.0* 9.7*  HCT 42.8 42.0 34.3* 28.1* 29.0*  MCV 87.7  --  85.1  88.1 85.8  PLT 202  --  181 186 208   Cardiac Enzymes: No results for input(s): "CKTOTAL", "CKMB", "CKMBINDEX", "TROPONINI" in the last 168 hours. BNP: Invalid input(s): "POCBNP" CBG: No results for input(s): "GLUCAP" in the last 168 hours. D-Dimer No results for input(s): "DDIMER" in the last 72 hours. Hgb A1c No results for input(s): "HGBA1C" in the last 72 hours. Lipid Profile No results for input(s): "CHOL", "HDL", "LDLCALC", "TRIG", "CHOLHDL", "LDLDIRECT" in the last 72 hours. Thyroid function studies No results for input(s): "TSH", "T4TOTAL", "T3FREE", "THYROIDAB" in the last 72 hours.  Invalid input(s): "FREET3" Anemia work up No results for input(s): "VITAMINB12", "FOLATE", "FERRITIN", "TIBC", "IRON", "RETICCTPCT" in the last 72 hours. Urinalysis    Component Value Date/Time   COLORURINE YELLOW 04/16/2016 1019   APPEARANCEUR CLOUDY (A) 04/16/2016 1019   LABSPEC 1.024 04/16/2016 1019   PHURINE 6.0 04/16/2016 1019   GLUCOSEU NEGATIVE 04/16/2016 1019   HGBUR NEGATIVE 04/16/2016 1019   BILIRUBINUR NEGATIVE 04/16/2016 1019   KETONESUR NEGATIVE 04/16/2016 1019   PROTEINUR NEGATIVE 04/16/2016 1019   NITRITE NEGATIVE 04/16/2016 1019   LEUKOCYTESUR NEGATIVE 04/16/2016 1019   Sepsis Labs Recent Labs  Lab 10/08/22 1518 10/09/22 1435 10/11/22 0233 10/11/22 0825  WBC 11.5* 14.9* 10.5 10.1   Microbiology No results found for this or any previous visit (from the past 240 hour(s)).   Time coordinating discharge: Over 30 minutes  SIGNED:   Darliss Cheney, MD  [facility] 10/11/2022, 9:34 AM *Please note that this is a verbal dictation therefore any spelling or grammatical errors are due to the "[employer]" system interpretation. If 7PM-7AM, please contact night-coverage [email or web address]

## 2022-10-11 NOTE — Progress Notes (Signed)
    Patient doing well S/P IM nail of R femur fracture by Dr Mable Fill. She has accepted placement at SNF and appears to have been cleared by medical team, she is eager to proceed to SNF. She reports well controlled pain at wrist and thigh, some discomfort about her hip. She has made the appropriate progress in PT.   Physical Exam: Vitals:   10/10/22 2140 10/11/22 0421  BP: 117/79 126/79  Pulse: (!) 105 99  Resp: 19 19  Temp: 97.9 F (36.6 C) 97.9 F (36.6 C)  SpO2: 92% 93%    Dressing in place, CDI, splint in place R wrist. Pt resting comfortably in bed eating breakfast, distal compartments soft, 2+ DPP and 2+ DRP = BIL, NVI   Assessment -81 y.o. female POD #3 s/p R IM femoral nail by Dr. Mable Fill, doing well. Pt cleared for release to SNF and eager to go.   -R wrist fx splinted and minimal discomfort, non-op treatment. Non-weightbearing in splint until f/u   Plan  Mobility: Out of bed with PT/OT -WBAT RLE, NWB RUE Pain control: Continue to wean/titrate to appropriate oral regimen DVT Prophylaxis: Lovenox 40 mg daily x 6 weeks Further surgical plans: None RLE: Weightbearing as tolerated, no restrictions Dressing care: Keep AQUACEL on and dry for up to 14 days.  Do not allow surgical area to get wet before that.  Remove AQUACEL dressing after 14 days and allow area to get wet in shower but DO NOT SUBMERGE until wound is evaluated in clinic.  In most cases skin glue is used and no additional dressing is necessary.  Disposition: D/C to SNF Follow-up: Please call [facility] [phone]) to schedule follow-op appointment for 2 weeks after surgery.   I have verified that my discharge instructions and follow-up information have been entered in the Discharge Navigator in Epic.  These should automatically populate in the AVS.  Please print the AVS in its entirety and ensure that the patient or a responsible party has a complete copy of the AVS before they are  discharged.  If there are questions regarding discharge instructions or follow-up before the AVS is generated, please check the Discharge Navigator before attempting to contact the surgeon/office.  If unsure how to access the Discharge Navigator or the information contained in the Discharge Navigator, or how to generate/print the AVS, please contact the appropriate Nurse, learning disability.    The patient's postoperative prescriptions (e.g. pain medication, anticoagulation) have been printed, signed, and placed in/on the patient's hard chart: Lovenox and Norco

## 2022-10-11 NOTE — Progress Notes (Signed)
Report called and received by Clydene Laming at [facility]. PT[facility] also notified. PTAR reported there are three(3) ahead of this patient. All Ivs have been removed, AVS printed with copy in packet and patient has a copy. Patient notified family that she will be transported to [place]. All personal Items packed with patient awaiting transport by PTAR  to facility.

## 2022-10-13 DIAGNOSIS — S62101D Fracture of unspecified carpal bone, right wrist, subsequent encounter for fracture with routine healing: Secondary | ICD-10-CM | POA: Diagnosis not present

## 2022-10-13 DIAGNOSIS — D500 Iron deficiency anemia secondary to blood loss (chronic): Secondary | ICD-10-CM | POA: Diagnosis not present

## 2022-10-13 DIAGNOSIS — S72301D Unspecified fracture of shaft of right femur, subsequent encounter for closed fracture with routine healing: Secondary | ICD-10-CM | POA: Diagnosis not present

## 2022-10-13 DIAGNOSIS — M8000XD Age-related osteoporosis with current pathological fracture, unspecified site, subsequent encounter for fracture with routine healing: Secondary | ICD-10-CM | POA: Diagnosis not present

## 2022-10-13 DIAGNOSIS — W19XXXD Unspecified fall, subsequent encounter: Secondary | ICD-10-CM | POA: Diagnosis not present

## 2022-10-13 DIAGNOSIS — I10 Essential (primary) hypertension: Secondary | ICD-10-CM | POA: Diagnosis not present

## 2022-10-13 DIAGNOSIS — K219 Gastro-esophageal reflux disease without esophagitis: Secondary | ICD-10-CM | POA: Diagnosis not present

## 2022-10-14 NOTE — Anesthesia Postprocedure Evaluation (Signed)
Anesthesia Post Note  Patient: Valerie Hull  Procedure(s) Performed: RIGHT INTRAMEDULLARY (IM) NAIL FEMORAL (Right)     Patient location during evaluation: PACU Anesthesia Type: General Level of consciousness: awake and alert Pain management: pain level controlled Vital Signs Assessment: post-procedure vital signs reviewed and stable Respiratory status: spontaneous breathing, nonlabored ventilation, respiratory function stable and patient connected to nasal cannula oxygen Cardiovascular status: blood pressure returned to baseline and stable Postop Assessment: no apparent nausea or vomiting Anesthetic complications: no   No notable events documented.  Last Vitals:  Vitals:   10/10/22 2140 10/11/22 0421  BP: 117/79 126/79  Pulse: (!) 105 99  Resp: 19 19  Temp: 36.6 C 36.6 C  SpO2: 92% 93%    Last Pain:  Vitals:   10/11/22 1047  TempSrc:   PainSc: Asleep                 Tiajuana Amass

## 2022-10-15 ENCOUNTER — Encounter (HOSPITAL_COMMUNITY): Payer: Self-pay | Admitting: Orthopedic Surgery

## 2022-10-24 DIAGNOSIS — M80051A Age-related osteoporosis with current pathological fracture, right femur, initial encounter for fracture: Secondary | ICD-10-CM | POA: Diagnosis not present

## 2022-10-24 DIAGNOSIS — M25531 Pain in right wrist: Secondary | ICD-10-CM | POA: Diagnosis not present

## 2022-11-03 DIAGNOSIS — M419 Scoliosis, unspecified: Secondary | ICD-10-CM | POA: Diagnosis not present

## 2022-11-03 DIAGNOSIS — Z7982 Long term (current) use of aspirin: Secondary | ICD-10-CM | POA: Diagnosis not present

## 2022-11-03 DIAGNOSIS — M80031D Age-related osteoporosis with current pathological fracture, right forearm, subsequent encounter for fracture with routine healing: Secondary | ICD-10-CM | POA: Diagnosis not present

## 2022-11-03 DIAGNOSIS — K5790 Diverticulosis of intestine, part unspecified, without perforation or abscess without bleeding: Secondary | ICD-10-CM | POA: Diagnosis not present

## 2022-11-03 DIAGNOSIS — K219 Gastro-esophageal reflux disease without esophagitis: Secondary | ICD-10-CM | POA: Diagnosis not present

## 2022-11-03 DIAGNOSIS — Z9181 History of falling: Secondary | ICD-10-CM | POA: Diagnosis not present

## 2022-11-03 DIAGNOSIS — Z79891 Long term (current) use of opiate analgesic: Secondary | ICD-10-CM | POA: Diagnosis not present

## 2022-11-03 DIAGNOSIS — W19XXXD Unspecified fall, subsequent encounter: Secondary | ICD-10-CM | POA: Diagnosis not present

## 2022-11-03 DIAGNOSIS — M80051D Age-related osteoporosis with current pathological fracture, right femur, subsequent encounter for fracture with routine healing: Secondary | ICD-10-CM | POA: Diagnosis not present

## 2022-11-03 DIAGNOSIS — D62 Acute posthemorrhagic anemia: Secondary | ICD-10-CM | POA: Diagnosis not present

## 2022-11-03 DIAGNOSIS — E7800 Pure hypercholesterolemia, unspecified: Secondary | ICD-10-CM | POA: Diagnosis not present

## 2022-11-03 DIAGNOSIS — Z7983 Long term (current) use of bisphosphonates: Secondary | ICD-10-CM | POA: Diagnosis not present

## 2022-11-03 DIAGNOSIS — I10 Essential (primary) hypertension: Secondary | ICD-10-CM | POA: Diagnosis not present

## 2022-11-03 DIAGNOSIS — K649 Unspecified hemorrhoids: Secondary | ICD-10-CM | POA: Diagnosis not present

## 2022-11-03 DIAGNOSIS — G8929 Other chronic pain: Secondary | ICD-10-CM | POA: Diagnosis not present

## 2022-11-03 DIAGNOSIS — K589 Irritable bowel syndrome without diarrhea: Secondary | ICD-10-CM | POA: Diagnosis not present

## 2022-11-03 DIAGNOSIS — M1990 Unspecified osteoarthritis, unspecified site: Secondary | ICD-10-CM | POA: Diagnosis not present

## 2022-11-06 DIAGNOSIS — M80051D Age-related osteoporosis with current pathological fracture, right femur, subsequent encounter for fracture with routine healing: Secondary | ICD-10-CM | POA: Diagnosis not present

## 2022-11-06 DIAGNOSIS — M80031D Age-related osteoporosis with current pathological fracture, right forearm, subsequent encounter for fracture with routine healing: Secondary | ICD-10-CM | POA: Diagnosis not present

## 2022-11-06 DIAGNOSIS — M1990 Unspecified osteoarthritis, unspecified site: Secondary | ICD-10-CM | POA: Diagnosis not present

## 2022-11-06 DIAGNOSIS — G8929 Other chronic pain: Secondary | ICD-10-CM | POA: Diagnosis not present

## 2022-11-06 DIAGNOSIS — I10 Essential (primary) hypertension: Secondary | ICD-10-CM | POA: Diagnosis not present

## 2022-11-06 DIAGNOSIS — W19XXXD Unspecified fall, subsequent encounter: Secondary | ICD-10-CM | POA: Diagnosis not present

## 2022-11-07 DIAGNOSIS — M810 Age-related osteoporosis without current pathological fracture: Secondary | ICD-10-CM | POA: Diagnosis not present

## 2022-11-07 DIAGNOSIS — I10 Essential (primary) hypertension: Secondary | ICD-10-CM | POA: Diagnosis not present

## 2022-11-07 DIAGNOSIS — Z9989 Dependence on other enabling machines and devices: Secondary | ICD-10-CM | POA: Diagnosis not present

## 2022-11-07 DIAGNOSIS — D649 Anemia, unspecified: Secondary | ICD-10-CM | POA: Diagnosis not present

## 2022-11-07 DIAGNOSIS — R600 Localized edema: Secondary | ICD-10-CM | POA: Diagnosis not present

## 2022-11-07 DIAGNOSIS — T148XXA Other injury of unspecified body region, initial encounter: Secondary | ICD-10-CM | POA: Diagnosis not present

## 2022-11-07 DIAGNOSIS — Z8781 Personal history of (healed) traumatic fracture: Secondary | ICD-10-CM | POA: Diagnosis not present

## 2022-11-08 DIAGNOSIS — W19XXXD Unspecified fall, subsequent encounter: Secondary | ICD-10-CM | POA: Diagnosis not present

## 2022-11-08 DIAGNOSIS — M80031D Age-related osteoporosis with current pathological fracture, right forearm, subsequent encounter for fracture with routine healing: Secondary | ICD-10-CM | POA: Diagnosis not present

## 2022-11-08 DIAGNOSIS — M1990 Unspecified osteoarthritis, unspecified site: Secondary | ICD-10-CM | POA: Diagnosis not present

## 2022-11-08 DIAGNOSIS — M80051D Age-related osteoporosis with current pathological fracture, right femur, subsequent encounter for fracture with routine healing: Secondary | ICD-10-CM | POA: Diagnosis not present

## 2022-11-08 DIAGNOSIS — G8929 Other chronic pain: Secondary | ICD-10-CM | POA: Diagnosis not present

## 2022-11-08 DIAGNOSIS — I10 Essential (primary) hypertension: Secondary | ICD-10-CM | POA: Diagnosis not present

## 2022-11-10 DIAGNOSIS — W19XXXD Unspecified fall, subsequent encounter: Secondary | ICD-10-CM | POA: Diagnosis not present

## 2022-11-10 DIAGNOSIS — M80051D Age-related osteoporosis with current pathological fracture, right femur, subsequent encounter for fracture with routine healing: Secondary | ICD-10-CM | POA: Diagnosis not present

## 2022-11-10 DIAGNOSIS — G8929 Other chronic pain: Secondary | ICD-10-CM | POA: Diagnosis not present

## 2022-11-10 DIAGNOSIS — M1990 Unspecified osteoarthritis, unspecified site: Secondary | ICD-10-CM | POA: Diagnosis not present

## 2022-11-10 DIAGNOSIS — I10 Essential (primary) hypertension: Secondary | ICD-10-CM | POA: Diagnosis not present

## 2022-11-10 DIAGNOSIS — M80031D Age-related osteoporosis with current pathological fracture, right forearm, subsequent encounter for fracture with routine healing: Secondary | ICD-10-CM | POA: Diagnosis not present

## 2022-11-12 DIAGNOSIS — W19XXXD Unspecified fall, subsequent encounter: Secondary | ICD-10-CM | POA: Diagnosis not present

## 2022-11-12 DIAGNOSIS — G8929 Other chronic pain: Secondary | ICD-10-CM | POA: Diagnosis not present

## 2022-11-12 DIAGNOSIS — M1990 Unspecified osteoarthritis, unspecified site: Secondary | ICD-10-CM | POA: Diagnosis not present

## 2022-11-12 DIAGNOSIS — M80031D Age-related osteoporosis with current pathological fracture, right forearm, subsequent encounter for fracture with routine healing: Secondary | ICD-10-CM | POA: Diagnosis not present

## 2022-11-12 DIAGNOSIS — I10 Essential (primary) hypertension: Secondary | ICD-10-CM | POA: Diagnosis not present

## 2022-11-12 DIAGNOSIS — M80051D Age-related osteoporosis with current pathological fracture, right femur, subsequent encounter for fracture with routine healing: Secondary | ICD-10-CM | POA: Diagnosis not present

## 2022-11-13 DIAGNOSIS — W19XXXD Unspecified fall, subsequent encounter: Secondary | ICD-10-CM | POA: Diagnosis not present

## 2022-11-13 DIAGNOSIS — I10 Essential (primary) hypertension: Secondary | ICD-10-CM | POA: Diagnosis not present

## 2022-11-13 DIAGNOSIS — M80051D Age-related osteoporosis with current pathological fracture, right femur, subsequent encounter for fracture with routine healing: Secondary | ICD-10-CM | POA: Diagnosis not present

## 2022-11-13 DIAGNOSIS — G8929 Other chronic pain: Secondary | ICD-10-CM | POA: Diagnosis not present

## 2022-11-13 DIAGNOSIS — M80031D Age-related osteoporosis with current pathological fracture, right forearm, subsequent encounter for fracture with routine healing: Secondary | ICD-10-CM | POA: Diagnosis not present

## 2022-11-13 DIAGNOSIS — M1990 Unspecified osteoarthritis, unspecified site: Secondary | ICD-10-CM | POA: Diagnosis not present

## 2022-11-14 DIAGNOSIS — M1990 Unspecified osteoarthritis, unspecified site: Secondary | ICD-10-CM | POA: Diagnosis not present

## 2022-11-14 DIAGNOSIS — M80031D Age-related osteoporosis with current pathological fracture, right forearm, subsequent encounter for fracture with routine healing: Secondary | ICD-10-CM | POA: Diagnosis not present

## 2022-11-14 DIAGNOSIS — I10 Essential (primary) hypertension: Secondary | ICD-10-CM | POA: Diagnosis not present

## 2022-11-14 DIAGNOSIS — M80051D Age-related osteoporosis with current pathological fracture, right femur, subsequent encounter for fracture with routine healing: Secondary | ICD-10-CM | POA: Diagnosis not present

## 2022-11-14 DIAGNOSIS — W19XXXD Unspecified fall, subsequent encounter: Secondary | ICD-10-CM | POA: Diagnosis not present

## 2022-11-14 DIAGNOSIS — G8929 Other chronic pain: Secondary | ICD-10-CM | POA: Diagnosis not present

## 2022-11-17 DIAGNOSIS — M1990 Unspecified osteoarthritis, unspecified site: Secondary | ICD-10-CM | POA: Diagnosis not present

## 2022-11-17 DIAGNOSIS — G8929 Other chronic pain: Secondary | ICD-10-CM | POA: Diagnosis not present

## 2022-11-17 DIAGNOSIS — W19XXXD Unspecified fall, subsequent encounter: Secondary | ICD-10-CM | POA: Diagnosis not present

## 2022-11-17 DIAGNOSIS — M80051D Age-related osteoporosis with current pathological fracture, right femur, subsequent encounter for fracture with routine healing: Secondary | ICD-10-CM | POA: Diagnosis not present

## 2022-11-17 DIAGNOSIS — I10 Essential (primary) hypertension: Secondary | ICD-10-CM | POA: Diagnosis not present

## 2022-11-17 DIAGNOSIS — M80031D Age-related osteoporosis with current pathological fracture, right forearm, subsequent encounter for fracture with routine healing: Secondary | ICD-10-CM | POA: Diagnosis not present

## 2022-11-18 DIAGNOSIS — M80051D Age-related osteoporosis with current pathological fracture, right femur, subsequent encounter for fracture with routine healing: Secondary | ICD-10-CM | POA: Diagnosis not present

## 2022-11-18 DIAGNOSIS — G8929 Other chronic pain: Secondary | ICD-10-CM | POA: Diagnosis not present

## 2022-11-18 DIAGNOSIS — W19XXXD Unspecified fall, subsequent encounter: Secondary | ICD-10-CM | POA: Diagnosis not present

## 2022-11-18 DIAGNOSIS — M80031D Age-related osteoporosis with current pathological fracture, right forearm, subsequent encounter for fracture with routine healing: Secondary | ICD-10-CM | POA: Diagnosis not present

## 2022-11-18 DIAGNOSIS — M1990 Unspecified osteoarthritis, unspecified site: Secondary | ICD-10-CM | POA: Diagnosis not present

## 2022-11-18 DIAGNOSIS — I10 Essential (primary) hypertension: Secondary | ICD-10-CM | POA: Diagnosis not present

## 2022-11-19 DIAGNOSIS — W19XXXD Unspecified fall, subsequent encounter: Secondary | ICD-10-CM | POA: Diagnosis not present

## 2022-11-19 DIAGNOSIS — M80031D Age-related osteoporosis with current pathological fracture, right forearm, subsequent encounter for fracture with routine healing: Secondary | ICD-10-CM | POA: Diagnosis not present

## 2022-11-19 DIAGNOSIS — M80051D Age-related osteoporosis with current pathological fracture, right femur, subsequent encounter for fracture with routine healing: Secondary | ICD-10-CM | POA: Diagnosis not present

## 2022-11-19 DIAGNOSIS — G8929 Other chronic pain: Secondary | ICD-10-CM | POA: Diagnosis not present

## 2022-11-19 DIAGNOSIS — M1990 Unspecified osteoarthritis, unspecified site: Secondary | ICD-10-CM | POA: Diagnosis not present

## 2022-11-19 DIAGNOSIS — I10 Essential (primary) hypertension: Secondary | ICD-10-CM | POA: Diagnosis not present

## 2022-11-20 DIAGNOSIS — I10 Essential (primary) hypertension: Secondary | ICD-10-CM | POA: Diagnosis not present

## 2022-11-20 DIAGNOSIS — M80031D Age-related osteoporosis with current pathological fracture, right forearm, subsequent encounter for fracture with routine healing: Secondary | ICD-10-CM | POA: Diagnosis not present

## 2022-11-20 DIAGNOSIS — G8929 Other chronic pain: Secondary | ICD-10-CM | POA: Diagnosis not present

## 2022-11-20 DIAGNOSIS — M80051D Age-related osteoporosis with current pathological fracture, right femur, subsequent encounter for fracture with routine healing: Secondary | ICD-10-CM | POA: Diagnosis not present

## 2022-11-20 DIAGNOSIS — W19XXXD Unspecified fall, subsequent encounter: Secondary | ICD-10-CM | POA: Diagnosis not present

## 2022-11-20 DIAGNOSIS — M1990 Unspecified osteoarthritis, unspecified site: Secondary | ICD-10-CM | POA: Diagnosis not present

## 2022-11-21 DIAGNOSIS — M25531 Pain in right wrist: Secondary | ICD-10-CM | POA: Diagnosis not present

## 2022-11-24 DIAGNOSIS — M1990 Unspecified osteoarthritis, unspecified site: Secondary | ICD-10-CM | POA: Diagnosis not present

## 2022-11-24 DIAGNOSIS — I10 Essential (primary) hypertension: Secondary | ICD-10-CM | POA: Diagnosis not present

## 2022-11-24 DIAGNOSIS — W19XXXD Unspecified fall, subsequent encounter: Secondary | ICD-10-CM | POA: Diagnosis not present

## 2022-11-24 DIAGNOSIS — M80031D Age-related osteoporosis with current pathological fracture, right forearm, subsequent encounter for fracture with routine healing: Secondary | ICD-10-CM | POA: Diagnosis not present

## 2022-11-24 DIAGNOSIS — M80051D Age-related osteoporosis with current pathological fracture, right femur, subsequent encounter for fracture with routine healing: Secondary | ICD-10-CM | POA: Diagnosis not present

## 2022-11-24 DIAGNOSIS — G8929 Other chronic pain: Secondary | ICD-10-CM | POA: Diagnosis not present

## 2022-11-25 DIAGNOSIS — M80051D Age-related osteoporosis with current pathological fracture, right femur, subsequent encounter for fracture with routine healing: Secondary | ICD-10-CM | POA: Diagnosis not present

## 2022-11-25 DIAGNOSIS — I10 Essential (primary) hypertension: Secondary | ICD-10-CM | POA: Diagnosis not present

## 2022-11-25 DIAGNOSIS — W19XXXD Unspecified fall, subsequent encounter: Secondary | ICD-10-CM | POA: Diagnosis not present

## 2022-11-25 DIAGNOSIS — G8929 Other chronic pain: Secondary | ICD-10-CM | POA: Diagnosis not present

## 2022-11-25 DIAGNOSIS — M1990 Unspecified osteoarthritis, unspecified site: Secondary | ICD-10-CM | POA: Diagnosis not present

## 2022-11-25 DIAGNOSIS — M80031D Age-related osteoporosis with current pathological fracture, right forearm, subsequent encounter for fracture with routine healing: Secondary | ICD-10-CM | POA: Diagnosis not present

## 2022-11-26 DIAGNOSIS — M80031D Age-related osteoporosis with current pathological fracture, right forearm, subsequent encounter for fracture with routine healing: Secondary | ICD-10-CM | POA: Diagnosis not present

## 2022-11-26 DIAGNOSIS — M1990 Unspecified osteoarthritis, unspecified site: Secondary | ICD-10-CM | POA: Diagnosis not present

## 2022-11-26 DIAGNOSIS — W19XXXD Unspecified fall, subsequent encounter: Secondary | ICD-10-CM | POA: Diagnosis not present

## 2022-11-26 DIAGNOSIS — G8929 Other chronic pain: Secondary | ICD-10-CM | POA: Diagnosis not present

## 2022-11-26 DIAGNOSIS — M80051D Age-related osteoporosis with current pathological fracture, right femur, subsequent encounter for fracture with routine healing: Secondary | ICD-10-CM | POA: Diagnosis not present

## 2022-11-26 DIAGNOSIS — I10 Essential (primary) hypertension: Secondary | ICD-10-CM | POA: Diagnosis not present

## 2022-11-27 DIAGNOSIS — W19XXXD Unspecified fall, subsequent encounter: Secondary | ICD-10-CM | POA: Diagnosis not present

## 2022-11-27 DIAGNOSIS — M1990 Unspecified osteoarthritis, unspecified site: Secondary | ICD-10-CM | POA: Diagnosis not present

## 2022-11-27 DIAGNOSIS — M80031D Age-related osteoporosis with current pathological fracture, right forearm, subsequent encounter for fracture with routine healing: Secondary | ICD-10-CM | POA: Diagnosis not present

## 2022-11-27 DIAGNOSIS — G8929 Other chronic pain: Secondary | ICD-10-CM | POA: Diagnosis not present

## 2022-11-27 DIAGNOSIS — I10 Essential (primary) hypertension: Secondary | ICD-10-CM | POA: Diagnosis not present

## 2022-11-27 DIAGNOSIS — M80051D Age-related osteoporosis with current pathological fracture, right femur, subsequent encounter for fracture with routine healing: Secondary | ICD-10-CM | POA: Diagnosis not present

## 2022-12-01 DIAGNOSIS — W19XXXD Unspecified fall, subsequent encounter: Secondary | ICD-10-CM | POA: Diagnosis not present

## 2022-12-01 DIAGNOSIS — G8929 Other chronic pain: Secondary | ICD-10-CM | POA: Diagnosis not present

## 2022-12-01 DIAGNOSIS — M80051D Age-related osteoporosis with current pathological fracture, right femur, subsequent encounter for fracture with routine healing: Secondary | ICD-10-CM | POA: Diagnosis not present

## 2022-12-01 DIAGNOSIS — I10 Essential (primary) hypertension: Secondary | ICD-10-CM | POA: Diagnosis not present

## 2022-12-01 DIAGNOSIS — M80031D Age-related osteoporosis with current pathological fracture, right forearm, subsequent encounter for fracture with routine healing: Secondary | ICD-10-CM | POA: Diagnosis not present

## 2022-12-01 DIAGNOSIS — M1990 Unspecified osteoarthritis, unspecified site: Secondary | ICD-10-CM | POA: Diagnosis not present

## 2022-12-02 DIAGNOSIS — M80051D Age-related osteoporosis with current pathological fracture, right femur, subsequent encounter for fracture with routine healing: Secondary | ICD-10-CM | POA: Diagnosis not present

## 2022-12-02 DIAGNOSIS — I10 Essential (primary) hypertension: Secondary | ICD-10-CM | POA: Diagnosis not present

## 2022-12-02 DIAGNOSIS — G8929 Other chronic pain: Secondary | ICD-10-CM | POA: Diagnosis not present

## 2022-12-02 DIAGNOSIS — M80031D Age-related osteoporosis with current pathological fracture, right forearm, subsequent encounter for fracture with routine healing: Secondary | ICD-10-CM | POA: Diagnosis not present

## 2022-12-02 DIAGNOSIS — M1990 Unspecified osteoarthritis, unspecified site: Secondary | ICD-10-CM | POA: Diagnosis not present

## 2022-12-02 DIAGNOSIS — W19XXXD Unspecified fall, subsequent encounter: Secondary | ICD-10-CM | POA: Diagnosis not present

## 2022-12-03 DIAGNOSIS — Z7983 Long term (current) use of bisphosphonates: Secondary | ICD-10-CM | POA: Diagnosis not present

## 2022-12-03 DIAGNOSIS — M80031D Age-related osteoporosis with current pathological fracture, right forearm, subsequent encounter for fracture with routine healing: Secondary | ICD-10-CM | POA: Diagnosis not present

## 2022-12-03 DIAGNOSIS — K649 Unspecified hemorrhoids: Secondary | ICD-10-CM | POA: Diagnosis not present

## 2022-12-03 DIAGNOSIS — K5790 Diverticulosis of intestine, part unspecified, without perforation or abscess without bleeding: Secondary | ICD-10-CM | POA: Diagnosis not present

## 2022-12-03 DIAGNOSIS — I10 Essential (primary) hypertension: Secondary | ICD-10-CM | POA: Diagnosis not present

## 2022-12-03 DIAGNOSIS — K219 Gastro-esophageal reflux disease without esophagitis: Secondary | ICD-10-CM | POA: Diagnosis not present

## 2022-12-03 DIAGNOSIS — M80051D Age-related osteoporosis with current pathological fracture, right femur, subsequent encounter for fracture with routine healing: Secondary | ICD-10-CM | POA: Diagnosis not present

## 2022-12-03 DIAGNOSIS — M419 Scoliosis, unspecified: Secondary | ICD-10-CM | POA: Diagnosis not present

## 2022-12-03 DIAGNOSIS — E7800 Pure hypercholesterolemia, unspecified: Secondary | ICD-10-CM | POA: Diagnosis not present

## 2022-12-03 DIAGNOSIS — K589 Irritable bowel syndrome without diarrhea: Secondary | ICD-10-CM | POA: Diagnosis not present

## 2022-12-03 DIAGNOSIS — Z9181 History of falling: Secondary | ICD-10-CM | POA: Diagnosis not present

## 2022-12-03 DIAGNOSIS — D62 Acute posthemorrhagic anemia: Secondary | ICD-10-CM | POA: Diagnosis not present

## 2022-12-03 DIAGNOSIS — Z79891 Long term (current) use of opiate analgesic: Secondary | ICD-10-CM | POA: Diagnosis not present

## 2022-12-03 DIAGNOSIS — W19XXXD Unspecified fall, subsequent encounter: Secondary | ICD-10-CM | POA: Diagnosis not present

## 2022-12-03 DIAGNOSIS — G8929 Other chronic pain: Secondary | ICD-10-CM | POA: Diagnosis not present

## 2022-12-03 DIAGNOSIS — Z7982 Long term (current) use of aspirin: Secondary | ICD-10-CM | POA: Diagnosis not present

## 2022-12-03 DIAGNOSIS — M1990 Unspecified osteoarthritis, unspecified site: Secondary | ICD-10-CM | POA: Diagnosis not present

## 2022-12-04 DIAGNOSIS — M80031D Age-related osteoporosis with current pathological fracture, right forearm, subsequent encounter for fracture with routine healing: Secondary | ICD-10-CM | POA: Diagnosis not present

## 2022-12-04 DIAGNOSIS — M80051D Age-related osteoporosis with current pathological fracture, right femur, subsequent encounter for fracture with routine healing: Secondary | ICD-10-CM | POA: Diagnosis not present

## 2022-12-04 DIAGNOSIS — W19XXXD Unspecified fall, subsequent encounter: Secondary | ICD-10-CM | POA: Diagnosis not present

## 2022-12-04 DIAGNOSIS — M1990 Unspecified osteoarthritis, unspecified site: Secondary | ICD-10-CM | POA: Diagnosis not present

## 2022-12-04 DIAGNOSIS — G8929 Other chronic pain: Secondary | ICD-10-CM | POA: Diagnosis not present

## 2022-12-04 DIAGNOSIS — I10 Essential (primary) hypertension: Secondary | ICD-10-CM | POA: Diagnosis not present

## 2022-12-09 DIAGNOSIS — W19XXXD Unspecified fall, subsequent encounter: Secondary | ICD-10-CM | POA: Diagnosis not present

## 2022-12-09 DIAGNOSIS — M1990 Unspecified osteoarthritis, unspecified site: Secondary | ICD-10-CM | POA: Diagnosis not present

## 2022-12-09 DIAGNOSIS — I10 Essential (primary) hypertension: Secondary | ICD-10-CM | POA: Diagnosis not present

## 2022-12-09 DIAGNOSIS — G8929 Other chronic pain: Secondary | ICD-10-CM | POA: Diagnosis not present

## 2022-12-09 DIAGNOSIS — M80031D Age-related osteoporosis with current pathological fracture, right forearm, subsequent encounter for fracture with routine healing: Secondary | ICD-10-CM | POA: Diagnosis not present

## 2022-12-09 DIAGNOSIS — M80051D Age-related osteoporosis with current pathological fracture, right femur, subsequent encounter for fracture with routine healing: Secondary | ICD-10-CM | POA: Diagnosis not present

## 2022-12-10 DIAGNOSIS — G8929 Other chronic pain: Secondary | ICD-10-CM | POA: Diagnosis not present

## 2022-12-10 DIAGNOSIS — I10 Essential (primary) hypertension: Secondary | ICD-10-CM | POA: Diagnosis not present

## 2022-12-10 DIAGNOSIS — M1990 Unspecified osteoarthritis, unspecified site: Secondary | ICD-10-CM | POA: Diagnosis not present

## 2022-12-10 DIAGNOSIS — M80051D Age-related osteoporosis with current pathological fracture, right femur, subsequent encounter for fracture with routine healing: Secondary | ICD-10-CM | POA: Diagnosis not present

## 2022-12-10 DIAGNOSIS — M80031D Age-related osteoporosis with current pathological fracture, right forearm, subsequent encounter for fracture with routine healing: Secondary | ICD-10-CM | POA: Diagnosis not present

## 2022-12-10 DIAGNOSIS — W19XXXD Unspecified fall, subsequent encounter: Secondary | ICD-10-CM | POA: Diagnosis not present

## 2022-12-11 DIAGNOSIS — M5416 Radiculopathy, lumbar region: Secondary | ICD-10-CM | POA: Diagnosis not present

## 2022-12-12 DIAGNOSIS — W19XXXD Unspecified fall, subsequent encounter: Secondary | ICD-10-CM | POA: Diagnosis not present

## 2022-12-12 DIAGNOSIS — G8929 Other chronic pain: Secondary | ICD-10-CM | POA: Diagnosis not present

## 2022-12-12 DIAGNOSIS — M80051D Age-related osteoporosis with current pathological fracture, right femur, subsequent encounter for fracture with routine healing: Secondary | ICD-10-CM | POA: Diagnosis not present

## 2022-12-12 DIAGNOSIS — M1990 Unspecified osteoarthritis, unspecified site: Secondary | ICD-10-CM | POA: Diagnosis not present

## 2022-12-12 DIAGNOSIS — I10 Essential (primary) hypertension: Secondary | ICD-10-CM | POA: Diagnosis not present

## 2022-12-12 DIAGNOSIS — M80031D Age-related osteoporosis with current pathological fracture, right forearm, subsequent encounter for fracture with routine healing: Secondary | ICD-10-CM | POA: Diagnosis not present

## 2022-12-14 DIAGNOSIS — M80031D Age-related osteoporosis with current pathological fracture, right forearm, subsequent encounter for fracture with routine healing: Secondary | ICD-10-CM | POA: Diagnosis not present

## 2022-12-14 DIAGNOSIS — W19XXXD Unspecified fall, subsequent encounter: Secondary | ICD-10-CM | POA: Diagnosis not present

## 2022-12-14 DIAGNOSIS — M1990 Unspecified osteoarthritis, unspecified site: Secondary | ICD-10-CM | POA: Diagnosis not present

## 2022-12-14 DIAGNOSIS — G8929 Other chronic pain: Secondary | ICD-10-CM | POA: Diagnosis not present

## 2022-12-14 DIAGNOSIS — M80051D Age-related osteoporosis with current pathological fracture, right femur, subsequent encounter for fracture with routine healing: Secondary | ICD-10-CM | POA: Diagnosis not present

## 2022-12-14 DIAGNOSIS — I10 Essential (primary) hypertension: Secondary | ICD-10-CM | POA: Diagnosis not present

## 2022-12-15 DIAGNOSIS — I10 Essential (primary) hypertension: Secondary | ICD-10-CM | POA: Diagnosis not present

## 2022-12-15 DIAGNOSIS — M80051D Age-related osteoporosis with current pathological fracture, right femur, subsequent encounter for fracture with routine healing: Secondary | ICD-10-CM | POA: Diagnosis not present

## 2022-12-15 DIAGNOSIS — M80031D Age-related osteoporosis with current pathological fracture, right forearm, subsequent encounter for fracture with routine healing: Secondary | ICD-10-CM | POA: Diagnosis not present

## 2022-12-15 DIAGNOSIS — M1990 Unspecified osteoarthritis, unspecified site: Secondary | ICD-10-CM | POA: Diagnosis not present

## 2022-12-15 DIAGNOSIS — G8929 Other chronic pain: Secondary | ICD-10-CM | POA: Diagnosis not present

## 2022-12-15 DIAGNOSIS — W19XXXD Unspecified fall, subsequent encounter: Secondary | ICD-10-CM | POA: Diagnosis not present

## 2022-12-18 DIAGNOSIS — M80051D Age-related osteoporosis with current pathological fracture, right femur, subsequent encounter for fracture with routine healing: Secondary | ICD-10-CM | POA: Diagnosis not present

## 2022-12-18 DIAGNOSIS — M1990 Unspecified osteoarthritis, unspecified site: Secondary | ICD-10-CM | POA: Diagnosis not present

## 2022-12-18 DIAGNOSIS — M80031D Age-related osteoporosis with current pathological fracture, right forearm, subsequent encounter for fracture with routine healing: Secondary | ICD-10-CM | POA: Diagnosis not present

## 2022-12-18 DIAGNOSIS — I10 Essential (primary) hypertension: Secondary | ICD-10-CM | POA: Diagnosis not present

## 2022-12-18 DIAGNOSIS — G8929 Other chronic pain: Secondary | ICD-10-CM | POA: Diagnosis not present

## 2022-12-18 DIAGNOSIS — W19XXXD Unspecified fall, subsequent encounter: Secondary | ICD-10-CM | POA: Diagnosis not present

## 2022-12-22 ENCOUNTER — Ambulatory Visit: Payer: Medicare Other | Admitting: Internal Medicine

## 2022-12-22 DIAGNOSIS — G8929 Other chronic pain: Secondary | ICD-10-CM | POA: Diagnosis not present

## 2022-12-22 DIAGNOSIS — W19XXXD Unspecified fall, subsequent encounter: Secondary | ICD-10-CM | POA: Diagnosis not present

## 2022-12-22 DIAGNOSIS — I10 Essential (primary) hypertension: Secondary | ICD-10-CM | POA: Diagnosis not present

## 2022-12-22 DIAGNOSIS — M80031D Age-related osteoporosis with current pathological fracture, right forearm, subsequent encounter for fracture with routine healing: Secondary | ICD-10-CM | POA: Diagnosis not present

## 2022-12-22 DIAGNOSIS — M80051D Age-related osteoporosis with current pathological fracture, right femur, subsequent encounter for fracture with routine healing: Secondary | ICD-10-CM | POA: Diagnosis not present

## 2022-12-22 DIAGNOSIS — M1990 Unspecified osteoarthritis, unspecified site: Secondary | ICD-10-CM | POA: Diagnosis not present

## 2022-12-24 DIAGNOSIS — G8929 Other chronic pain: Secondary | ICD-10-CM | POA: Diagnosis not present

## 2022-12-24 DIAGNOSIS — W19XXXD Unspecified fall, subsequent encounter: Secondary | ICD-10-CM | POA: Diagnosis not present

## 2022-12-24 DIAGNOSIS — M1990 Unspecified osteoarthritis, unspecified site: Secondary | ICD-10-CM | POA: Diagnosis not present

## 2022-12-24 DIAGNOSIS — I10 Essential (primary) hypertension: Secondary | ICD-10-CM | POA: Diagnosis not present

## 2022-12-24 DIAGNOSIS — M80031D Age-related osteoporosis with current pathological fracture, right forearm, subsequent encounter for fracture with routine healing: Secondary | ICD-10-CM | POA: Diagnosis not present

## 2022-12-24 DIAGNOSIS — M80051D Age-related osteoporosis with current pathological fracture, right femur, subsequent encounter for fracture with routine healing: Secondary | ICD-10-CM | POA: Diagnosis not present

## 2022-12-25 DIAGNOSIS — M1990 Unspecified osteoarthritis, unspecified site: Secondary | ICD-10-CM | POA: Diagnosis not present

## 2022-12-25 DIAGNOSIS — M80051D Age-related osteoporosis with current pathological fracture, right femur, subsequent encounter for fracture with routine healing: Secondary | ICD-10-CM | POA: Diagnosis not present

## 2022-12-25 DIAGNOSIS — M80031D Age-related osteoporosis with current pathological fracture, right forearm, subsequent encounter for fracture with routine healing: Secondary | ICD-10-CM | POA: Diagnosis not present

## 2022-12-25 DIAGNOSIS — G8929 Other chronic pain: Secondary | ICD-10-CM | POA: Diagnosis not present

## 2022-12-25 DIAGNOSIS — I10 Essential (primary) hypertension: Secondary | ICD-10-CM | POA: Diagnosis not present

## 2022-12-25 DIAGNOSIS — W19XXXD Unspecified fall, subsequent encounter: Secondary | ICD-10-CM | POA: Diagnosis not present

## 2022-12-29 DIAGNOSIS — G8929 Other chronic pain: Secondary | ICD-10-CM | POA: Diagnosis not present

## 2022-12-29 DIAGNOSIS — I10 Essential (primary) hypertension: Secondary | ICD-10-CM | POA: Diagnosis not present

## 2022-12-29 DIAGNOSIS — M1990 Unspecified osteoarthritis, unspecified site: Secondary | ICD-10-CM | POA: Diagnosis not present

## 2022-12-29 DIAGNOSIS — M80031D Age-related osteoporosis with current pathological fracture, right forearm, subsequent encounter for fracture with routine healing: Secondary | ICD-10-CM | POA: Diagnosis not present

## 2022-12-29 DIAGNOSIS — M80051D Age-related osteoporosis with current pathological fracture, right femur, subsequent encounter for fracture with routine healing: Secondary | ICD-10-CM | POA: Diagnosis not present

## 2022-12-29 DIAGNOSIS — W19XXXD Unspecified fall, subsequent encounter: Secondary | ICD-10-CM | POA: Diagnosis not present

## 2022-12-31 DIAGNOSIS — M80051D Age-related osteoporosis with current pathological fracture, right femur, subsequent encounter for fracture with routine healing: Secondary | ICD-10-CM | POA: Diagnosis not present

## 2022-12-31 DIAGNOSIS — M80031D Age-related osteoporosis with current pathological fracture, right forearm, subsequent encounter for fracture with routine healing: Secondary | ICD-10-CM | POA: Diagnosis not present

## 2022-12-31 DIAGNOSIS — M1990 Unspecified osteoarthritis, unspecified site: Secondary | ICD-10-CM | POA: Diagnosis not present

## 2022-12-31 DIAGNOSIS — W19XXXD Unspecified fall, subsequent encounter: Secondary | ICD-10-CM | POA: Diagnosis not present

## 2022-12-31 DIAGNOSIS — M80051A Age-related osteoporosis with current pathological fracture, right femur, initial encounter for fracture: Secondary | ICD-10-CM | POA: Diagnosis not present

## 2022-12-31 DIAGNOSIS — I10 Essential (primary) hypertension: Secondary | ICD-10-CM | POA: Diagnosis not present

## 2022-12-31 DIAGNOSIS — G8929 Other chronic pain: Secondary | ICD-10-CM | POA: Diagnosis not present

## 2022-12-31 DIAGNOSIS — M25531 Pain in right wrist: Secondary | ICD-10-CM | POA: Diagnosis not present

## 2023-01-01 DIAGNOSIS — W19XXXD Unspecified fall, subsequent encounter: Secondary | ICD-10-CM | POA: Diagnosis not present

## 2023-01-01 DIAGNOSIS — G8929 Other chronic pain: Secondary | ICD-10-CM | POA: Diagnosis not present

## 2023-01-01 DIAGNOSIS — M80031D Age-related osteoporosis with current pathological fracture, right forearm, subsequent encounter for fracture with routine healing: Secondary | ICD-10-CM | POA: Diagnosis not present

## 2023-01-01 DIAGNOSIS — M80051D Age-related osteoporosis with current pathological fracture, right femur, subsequent encounter for fracture with routine healing: Secondary | ICD-10-CM | POA: Diagnosis not present

## 2023-01-01 DIAGNOSIS — I10 Essential (primary) hypertension: Secondary | ICD-10-CM | POA: Diagnosis not present

## 2023-01-01 DIAGNOSIS — M1990 Unspecified osteoarthritis, unspecified site: Secondary | ICD-10-CM | POA: Diagnosis not present

## 2023-01-06 ENCOUNTER — Encounter: Payer: Self-pay | Admitting: Internal Medicine

## 2023-01-06 ENCOUNTER — Ambulatory Visit: Payer: Medicare Other | Admitting: Internal Medicine

## 2023-01-06 VITALS — BP 154/103 | HR 95 | Resp 16 | Ht <= 58 in | Wt 95.0 lb

## 2023-01-06 DIAGNOSIS — R9431 Abnormal electrocardiogram [ECG] [EKG]: Secondary | ICD-10-CM

## 2023-01-06 DIAGNOSIS — I480 Paroxysmal atrial fibrillation: Secondary | ICD-10-CM

## 2023-01-06 DIAGNOSIS — I10 Essential (primary) hypertension: Secondary | ICD-10-CM

## 2023-01-06 DIAGNOSIS — R55 Syncope and collapse: Secondary | ICD-10-CM

## 2023-01-06 MED ORDER — METOPROLOL SUCCINATE ER 25 MG PO TB24: 25.0000 mg | ORAL_TABLET | Freq: Every day | ORAL | 3 refills | Status: DC

## 2023-01-06 NOTE — Progress Notes (Signed)
Primary Physician/Referring:  Kathyrn Lass, MD  Patient ID: Valerie Hull, female    DOB: Sep 24, 1941, 82 y.o.   MRN: [id number]  No chief complaint on file.  HPI:    Valerie Hull  is a 82 y.o. female with possible atrial fibrillation who is here to establish care with cardiology.  Patient has been told on several occasions that she has atrial fibrillation but she has never actually stayed in it.  Today in office she is in sinus rhythm on our EKG.  In 2021 she had a syncopal episode and sustained a massive fall at that time.  She admits that she has not had any further syncope since then but she does admit to frequent falls.  Patient was admitted to the hospital for many days in December 2023 after she fell going down the stairs.  She sustained some major fractures and her femur is still not repaired as it should be.  Patient is very weary to be on a blood thinner even if she does have A-fib considering she falls all of the time.  Shared decision making with the patient, we have decided we will not start anticoagulation as she has also sustained head injuries from falls in the past and it would be more dangerous to put her on a blood thinner.  Patient is agreeable to wearing an event monitor.  She does not ever feel it when she is in A-fib.  Patient denies chest pain, shortness of breath, palpitations, diaphoresis, syncope, edema, orthopnea, PND.  Past Medical History:  Diagnosis Date   Acid reflux    Arthritis    Colon polyps    DJD (degenerative joint disease)    Hemorrhoids    History of hiatal hernia    Hypercholesteremia    IBS (irritable bowel syndrome)    Osteoporosis    PONV (postoperative nausea and vomiting)    Scoliosis    Past Surgical History:  Procedure Laterality Date   cataracts Bilateral    CHOLECYSTECTOMY     laparscopic   COLONOSCOPY  2020   DILATION AND CURETTAGE OF UTERUS     x2   EVALUATION UNDER ANESTHESIA WITH HEMORRHOIDECTOMY N/A 11/25/2019   Procedure:  ANORECTAL EXAM UNDER ANESTHESIA WITH HEMORRHOIDECTOMY, HEMORRHOIDAL LIGATION/PEXY;  Surgeon: Michael Boston, MD;  Location: [place];  Service: General;  Laterality: N/A;   eye lid surgery Bilateral    EYE SURGERY     age 89 both eyes muscle, sx at [facility] for seeing double 1 eye   FEMUR IM NAIL Right 10/08/2022   Procedure: RIGHT INTRAMEDULLARY (IM) NAIL FEMORAL;  Surgeon: Georgeanna Harrison, MD;  Location: [place];  Service: Orthopedics;  Laterality: Right;   HIP SURGERY Bilateral    left 1988, right done 1988   NASAL ENDOSCOPY     esophagus stretched   NASAL SEPTUM SURGERY     thumb surgery Left    joint replaced   WRIST SURGERY Left    Family History  Problem Relation Age of Onset   Rheum arthritis Mother    Cancer Mother    Heart failure Father    Colitis Sister    Colitis Sister    Colon cancer Maternal Aunt    Colon polyps Neg Hx    Stomach cancer Neg Hx    Esophageal cancer Neg Hx     Social History   Tobacco Use   Smoking status: Never   Smokeless tobacco: Never  Substance Use Topics   Alcohol  use: No   Marital Status: Single  ROS  Review of Systems  Musculoskeletal:  Positive for arthritis, falls and joint pain.   Objective  Blood pressure (!) 154/103, pulse 95, resp. rate 16, height '4\' 10"'$  (1.473 m), weight 95 lb (43.1 kg), SpO2 96 %. Body mass index is 19.86 kg/m.     01/06/2023   10:30 AM 10/11/2022    4:21 AM 10/10/2022    9:40 PM  Vitals with BMI  Height '4\' 10"'$     Weight 95 lbs    BMI [id number]    Systolic [id number]  Diastolic [id number] 79 79  Pulse 95 99 105     Physical Exam Vitals reviewed.  HENT:     Head: Normocephalic and atraumatic.  Neck:     Vascular: No carotid bruit.  Cardiovascular:     Rate and Rhythm: Normal rate and regular rhythm.     Pulses: Normal pulses.     Heart sounds: Murmur heard.  Pulmonary:     Effort: Pulmonary effort is normal.     Breath sounds: Normal breath sounds.  Abdominal:     General: Bowel sounds are  normal.  Musculoskeletal:     Right lower leg: No edema.     Left lower leg: No edema.  Skin:    General: Skin is warm and dry.  Neurological:     Mental Status: She is alert.     Medications and allergies   Allergies  Allergen Reactions   Ampicillin Hives and Nausea And Vomiting    Has patient had a PCN reaction causing immediate rash, facial/tongue/throat swelling, SOB or lightheadedness with hypotension: No Has patient had a PCN reaction causing severe rash involving mucus membranes or skin necrosis: No Has patient had a PCN reaction that required hospitalization No Has patient had a PCN reaction occurring within the last 10 years: No If all of the above answers are "NO", then may proceed with Cephalosporin use.   Codeine Nausea And Vomiting   Crestor [Rosuvastatin] Other (See Comments)    Pain in joints   Lipitor [Atorvastatin]     elevated liver enzymes   Nitroglycerin Other (See Comments)    Blood pressure dropped with nitroglycerin cream   Sulfa Antibiotics Hives   Adhesive [Tape] Rash     Medication list after today's encounter   Current Outpatient Medications:    alendronate (FOSAMAX) 70 MG tablet, Take 70 mg by mouth once a week. Tuesdays, Disp: , Rfl:    Cyanocobalamin (VITAMIN B-12 PO), Take 1 tablet by mouth daily., Disp: , Rfl:    diphenhydramine-acetaminophen (TYLENOL PM) 25-500 MG TABS tablet, Take 1 tablet by mouth at bedtime., Disp: , Rfl:    gabapentin (NEURONTIN) 300 MG capsule, Take 300 mg by mouth 2 (two) times daily., Disp: , Rfl:    metoprolol succinate (TOPROL XL) 25 MG 24 hr tablet, Take 1 tablet (25 mg total) by mouth at bedtime., Disp: 90 tablet, Rfl: 3   omeprazole (PRILOSEC) 40 MG capsule, Take 40 mg by mouth daily., Disp: , Rfl:    tiZANidine (ZANAFLEX) 4 MG tablet, Take 4 mg by mouth at bedtime., Disp: , Rfl:   Laboratory examination:   Lab Results  Component Value Date   NA 138 10/11/2022   K 3.5 10/11/2022   CO2 20 (L) 10/11/2022    GLUCOSE 89 10/11/2022   BUN 19 10/11/2022   CREATININE 0.63 10/11/2022   CALCIUM 8.2 (L) 10/11/2022   GFRNONAA >60 10/11/2022  Latest Ref Rng & Units 10/11/2022    2:33 AM 10/09/2022    2:35 PM 10/08/2022    3:36 PM  CMP  Glucose 70 - 99 mg/dL 89  115  114   BUN 8 - 23 mg/dL '19  14  14   '$ Creatinine 0.44 - 1.00 mg/dL 0.63  0.69  0.50   Sodium 135 - 145 mmol/L 138  136  141   Potassium 3.5 - 5.1 mmol/L 3.5  3.7  3.0   Chloride 98 - 111 mmol/L 107  107  105   CO2 22 - 32 mmol/L 20  19    Calcium 8.9 - 10.3 mg/dL 8.2  8.3        Latest Ref Rng & Units 10/11/2022    8:25 AM 10/11/2022    2:33 AM 10/09/2022    2:35 PM  CBC  WBC 4.0 - 10.5 K/uL 10.1  10.5  14.9   Hemoglobin 12.0 - 15.0 g/dL 9.7  9.0  11.3   Hematocrit 36.0 - 46.0 % 29.0  28.1  34.3   Platelets 150 - 400 K/uL 208  186  181     Lipid Panel No results for input(s): "CHOL", "TRIG", "LDLCALC", "VLDL", "HDL", "CHOLHDL", "LDLDIRECT" in the last 8760 hours.  HEMOGLOBIN A1C No results found for: "HGBA1C", "MPG" TSH No results for input(s): "TSH" in the last 8760 hours.  External labs:   Labs 11/07/2022: Glucose 98 BUN 17 creatinine 0.56 potassium 4.3 Hemoglobin 11.9 hematocrit 37 Platelets 284   Radiology:    Cardiac Studies:     EKG:   01/06/2023: Sinus Rhythm with PVCs, rate 95 bpm. Incomplete RBBB.  Assessment     ICD-10-CM   1. Syncope and collapse  R55 EKG 12-Lead    [facility] ECHOCARDIOGRAM COMPLETE    LONG TERM MONITOR (3-14 DAYS)    2. Nonspecific abnormal electrocardiogram (ECG) (EKG)  R94.31 PCV ECHOCARDIOGRAM COMPLETE    LONG TERM MONITOR (3-14 DAYS)    3. Essential hypertension  I10 PCV ECHOCARDIOGRAM COMPLETE    LONG TERM MONITOR (3-14 DAYS)    4. Paroxysmal atrial fibrillation (HCC)  I48.0        Orders Placed This Encounter  Procedures   LONG TERM MONITOR (3-14 DAYS)    Standing Status:   Future    Number of Occurrences:   1    Standing Expiration Date:   01/06/2024    Order  Specific Question:   Where should this test be performed?    Answer:   [facility]    Order Specific Question:   Does the patient have an implanted cardiac device?    Answer:   No    Order Specific Question:   Prescribed days of wear    Answer:   10    Order Specific Question:   Type of enrollment    Answer:   Clinic Enrollment    Order Specific Question:   Release to patient    Answer:   Immediate   EKG 12-Lead   PCV ECHOCARDIOGRAM COMPLETE    Standing Status:   Future    Standing Expiration Date:   01/06/2024    Meds ordered this encounter  Medications   metoprolol succinate (TOPROL XL) 25 MG 24 hr tablet    Sig: Take 1 tablet (25 mg total) by mouth at bedtime.    Dispense:  90 tablet    Refill:  3    Medications Discontinued During This Encounter  Medication Reason   enoxaparin (  LOVENOX) 40 MG/0.4ML injection    HYDROcodone-acetaminophen (NORCO/VICODIN) 5-325 MG tablet Completed Course   amLODipine (NORVASC) 2.5 MG tablet      Recommendations:   Valerie Hull is a 82 y.o.  with possible atrial fibrillation  CHA2DS2VASc = at least 4  Syncope and collapse Last episode of syncope was in 2021 but patient is a fall risk despite not having further episodes of syncope She was admitted to the hospital in 10/2022 after she sustained a major fall going down the stairs I am very hesitant to recommend [place] for her and she prefers not to be on [facility] as well  Nonspecific abnormal electrocardiogram (ECG) (EKG) Echocardiogram ordered  Essential hypertension Stop amlodipine and start Toprol 25 mg qHS. Encourage low-sodium diet, less than 2000 mg daily.  Paroxysmal atrial fibrillation (HCC) Heart rate should be better controlled on Toprol Will obtain event monitor to assess Afib burden She is very high fall risk and sustained major fractures a few months ago after she fell We will not start [facility] as she admits she falls a lot and her falls are severe Follow-up in 1-2 months or  sooner if needed    Floydene Flock, DO, [facility]  01/06/2023, 2:11 PM Office: [phone] Pager: [phone]

## 2023-01-08 DIAGNOSIS — M6281 Muscle weakness (generalized): Secondary | ICD-10-CM | POA: Diagnosis not present

## 2023-01-08 DIAGNOSIS — R262 Difficulty in walking, not elsewhere classified: Secondary | ICD-10-CM | POA: Diagnosis not present

## 2023-01-08 DIAGNOSIS — M80051D Age-related osteoporosis with current pathological fracture, right femur, subsequent encounter for fracture with routine healing: Secondary | ICD-10-CM | POA: Diagnosis not present

## 2023-01-13 DIAGNOSIS — M6281 Muscle weakness (generalized): Secondary | ICD-10-CM | POA: Diagnosis not present

## 2023-01-13 DIAGNOSIS — M80051D Age-related osteoporosis with current pathological fracture, right femur, subsequent encounter for fracture with routine healing: Secondary | ICD-10-CM | POA: Diagnosis not present

## 2023-01-13 DIAGNOSIS — R262 Difficulty in walking, not elsewhere classified: Secondary | ICD-10-CM | POA: Diagnosis not present

## 2023-01-14 ENCOUNTER — Ambulatory Visit: Payer: Medicare Other

## 2023-01-14 DIAGNOSIS — R55 Syncope and collapse: Secondary | ICD-10-CM

## 2023-01-14 DIAGNOSIS — I10 Essential (primary) hypertension: Secondary | ICD-10-CM

## 2023-01-14 DIAGNOSIS — R9431 Abnormal electrocardiogram [ECG] [EKG]: Secondary | ICD-10-CM | POA: Diagnosis not present

## 2023-01-15 DIAGNOSIS — R262 Difficulty in walking, not elsewhere classified: Secondary | ICD-10-CM | POA: Diagnosis not present

## 2023-01-15 DIAGNOSIS — M80051D Age-related osteoporosis with current pathological fracture, right femur, subsequent encounter for fracture with routine healing: Secondary | ICD-10-CM | POA: Diagnosis not present

## 2023-01-15 DIAGNOSIS — M6281 Muscle weakness (generalized): Secondary | ICD-10-CM | POA: Diagnosis not present

## 2023-01-16 DIAGNOSIS — S52501D Unspecified fracture of the lower end of right radius, subsequent encounter for closed fracture with routine healing: Secondary | ICD-10-CM | POA: Diagnosis not present

## 2023-01-19 DIAGNOSIS — M6281 Muscle weakness (generalized): Secondary | ICD-10-CM | POA: Diagnosis not present

## 2023-01-19 DIAGNOSIS — M80051D Age-related osteoporosis with current pathological fracture, right femur, subsequent encounter for fracture with routine healing: Secondary | ICD-10-CM | POA: Diagnosis not present

## 2023-01-19 DIAGNOSIS — R262 Difficulty in walking, not elsewhere classified: Secondary | ICD-10-CM | POA: Diagnosis not present

## 2023-01-21 DIAGNOSIS — R262 Difficulty in walking, not elsewhere classified: Secondary | ICD-10-CM | POA: Diagnosis not present

## 2023-01-21 DIAGNOSIS — M6281 Muscle weakness (generalized): Secondary | ICD-10-CM | POA: Diagnosis not present

## 2023-01-21 DIAGNOSIS — M80051D Age-related osteoporosis with current pathological fracture, right femur, subsequent encounter for fracture with routine healing: Secondary | ICD-10-CM | POA: Diagnosis not present

## 2023-01-22 DIAGNOSIS — S52501D Unspecified fracture of the lower end of right radius, subsequent encounter for closed fracture with routine healing: Secondary | ICD-10-CM | POA: Diagnosis not present

## 2023-01-26 DIAGNOSIS — R262 Difficulty in walking, not elsewhere classified: Secondary | ICD-10-CM | POA: Diagnosis not present

## 2023-01-26 DIAGNOSIS — M80051D Age-related osteoporosis with current pathological fracture, right femur, subsequent encounter for fracture with routine healing: Secondary | ICD-10-CM | POA: Diagnosis not present

## 2023-01-26 DIAGNOSIS — M6281 Muscle weakness (generalized): Secondary | ICD-10-CM | POA: Diagnosis not present

## 2023-01-27 DIAGNOSIS — S52501D Unspecified fracture of the lower end of right radius, subsequent encounter for closed fracture with routine healing: Secondary | ICD-10-CM | POA: Diagnosis not present

## 2023-01-28 DIAGNOSIS — M6281 Muscle weakness (generalized): Secondary | ICD-10-CM | POA: Diagnosis not present

## 2023-01-28 DIAGNOSIS — R262 Difficulty in walking, not elsewhere classified: Secondary | ICD-10-CM | POA: Diagnosis not present

## 2023-01-28 DIAGNOSIS — M80051D Age-related osteoporosis with current pathological fracture, right femur, subsequent encounter for fracture with routine healing: Secondary | ICD-10-CM | POA: Diagnosis not present

## 2023-02-02 DIAGNOSIS — R262 Difficulty in walking, not elsewhere classified: Secondary | ICD-10-CM | POA: Diagnosis not present

## 2023-02-02 DIAGNOSIS — M80051D Age-related osteoporosis with current pathological fracture, right femur, subsequent encounter for fracture with routine healing: Secondary | ICD-10-CM | POA: Diagnosis not present

## 2023-02-02 DIAGNOSIS — M6281 Muscle weakness (generalized): Secondary | ICD-10-CM | POA: Diagnosis not present

## 2023-02-03 DIAGNOSIS — R55 Syncope and collapse: Secondary | ICD-10-CM | POA: Diagnosis not present

## 2023-02-03 DIAGNOSIS — I10 Essential (primary) hypertension: Secondary | ICD-10-CM | POA: Diagnosis not present

## 2023-02-03 DIAGNOSIS — S52501D Unspecified fracture of the lower end of right radius, subsequent encounter for closed fracture with routine healing: Secondary | ICD-10-CM | POA: Diagnosis not present

## 2023-02-09 DIAGNOSIS — M80051D Age-related osteoporosis with current pathological fracture, right femur, subsequent encounter for fracture with routine healing: Secondary | ICD-10-CM | POA: Diagnosis not present

## 2023-02-09 DIAGNOSIS — M6281 Muscle weakness (generalized): Secondary | ICD-10-CM | POA: Diagnosis not present

## 2023-02-09 DIAGNOSIS — R262 Difficulty in walking, not elsewhere classified: Secondary | ICD-10-CM | POA: Diagnosis not present

## 2023-02-12 DIAGNOSIS — M6281 Muscle weakness (generalized): Secondary | ICD-10-CM | POA: Diagnosis not present

## 2023-02-12 DIAGNOSIS — M80051D Age-related osteoporosis with current pathological fracture, right femur, subsequent encounter for fracture with routine healing: Secondary | ICD-10-CM | POA: Diagnosis not present

## 2023-02-12 DIAGNOSIS — R262 Difficulty in walking, not elsewhere classified: Secondary | ICD-10-CM | POA: Diagnosis not present

## 2023-02-13 DIAGNOSIS — S52501D Unspecified fracture of the lower end of right radius, subsequent encounter for closed fracture with routine healing: Secondary | ICD-10-CM | POA: Diagnosis not present

## 2023-02-16 DIAGNOSIS — M80051D Age-related osteoporosis with current pathological fracture, right femur, subsequent encounter for fracture with routine healing: Secondary | ICD-10-CM | POA: Diagnosis not present

## 2023-02-16 DIAGNOSIS — M6281 Muscle weakness (generalized): Secondary | ICD-10-CM | POA: Diagnosis not present

## 2023-02-16 DIAGNOSIS — R262 Difficulty in walking, not elsewhere classified: Secondary | ICD-10-CM | POA: Diagnosis not present

## 2023-02-17 ENCOUNTER — Ambulatory Visit: Payer: Medicare Other | Admitting: Internal Medicine

## 2023-02-17 DIAGNOSIS — S52501D Unspecified fracture of the lower end of right radius, subsequent encounter for closed fracture with routine healing: Secondary | ICD-10-CM | POA: Diagnosis not present

## 2023-02-18 DIAGNOSIS — M80051D Age-related osteoporosis with current pathological fracture, right femur, subsequent encounter for fracture with routine healing: Secondary | ICD-10-CM | POA: Diagnosis not present

## 2023-02-18 DIAGNOSIS — R262 Difficulty in walking, not elsewhere classified: Secondary | ICD-10-CM | POA: Diagnosis not present

## 2023-02-18 DIAGNOSIS — M6281 Muscle weakness (generalized): Secondary | ICD-10-CM | POA: Diagnosis not present

## 2023-02-23 ENCOUNTER — Ambulatory Visit: Payer: Medicare Other | Admitting: Internal Medicine

## 2023-02-23 ENCOUNTER — Encounter: Payer: Self-pay | Admitting: Internal Medicine

## 2023-02-23 VITALS — BP 159/94 | HR 67 | Ht <= 58 in | Wt 97.8 lb

## 2023-02-23 DIAGNOSIS — R55 Syncope and collapse: Secondary | ICD-10-CM

## 2023-02-23 DIAGNOSIS — M80051D Age-related osteoporosis with current pathological fracture, right femur, subsequent encounter for fracture with routine healing: Secondary | ICD-10-CM | POA: Diagnosis not present

## 2023-02-23 DIAGNOSIS — I10 Essential (primary) hypertension: Secondary | ICD-10-CM | POA: Diagnosis not present

## 2023-02-23 DIAGNOSIS — I4719 Other supraventricular tachycardia: Secondary | ICD-10-CM | POA: Diagnosis not present

## 2023-02-23 DIAGNOSIS — M6281 Muscle weakness (generalized): Secondary | ICD-10-CM | POA: Diagnosis not present

## 2023-02-23 DIAGNOSIS — R262 Difficulty in walking, not elsewhere classified: Secondary | ICD-10-CM | POA: Diagnosis not present

## 2023-02-23 DIAGNOSIS — I480 Paroxysmal atrial fibrillation: Secondary | ICD-10-CM

## 2023-02-23 DIAGNOSIS — R9431 Abnormal electrocardiogram [ECG] [EKG]: Secondary | ICD-10-CM | POA: Diagnosis not present

## 2023-02-23 NOTE — Progress Notes (Unsigned)
Primary Physician/Referring:  Sigmund Hazel, MD  Patient ID: Valerie Hull, female    DOB: 10-Jun-1941, 82 y.o.   MRN: [id number]  Chief Complaint  Patient presents with   Syncope and collapse   Follow-up   Results    HPI:    Valerie Hull  is a 82 y.o. female with possible atrial fibrillation who is here to establish care with cardiology.  Patient has been told on several occasions that she has atrial fibrillation but she has never actually stayed in it.  Today in office she is in sinus rhythm on our EKG.  In 2021 she had a syncopal episode and sustained a massive fall at that time.  She admits that she has not had any further syncope since then but she does admit to frequent falls.  Patient was admitted to the hospital for many days in December 2023 after she fell going down the stairs.  She sustained some major fractures and her femur is still not repaired as it should be.  Patient is very weary to be on a blood thinner even if she does have A-fib considering she falls all of the time.  Shared decision making with the patient, we have decided we will not start anticoagulation as she has also sustained head injuries from falls in the past and it would be more dangerous to put her on a blood thinner.  Patient is agreeable to wearing an event monitor.  She does not ever feel it when she is in A-fib.  Patient denies chest pain, shortness of breath, palpitations, diaphoresis, syncope, edema, orthopnea, PND.  Past Medical History:  Diagnosis Date   Acid reflux    Arthritis    Colon polyps    DJD (degenerative joint disease)    Hemorrhoids    History of hiatal hernia    Hypercholesteremia    IBS (irritable bowel syndrome)    Osteoporosis    PONV (postoperative nausea and vomiting)    Scoliosis    Past Surgical History:  Procedure Laterality Date   cataracts Bilateral    CHOLECYSTECTOMY     laparscopic   COLONOSCOPY  2020   DILATION AND CURETTAGE OF UTERUS     x2   EVALUATION  UNDER ANESTHESIA WITH HEMORRHOIDECTOMY N/A 11/25/2019   Procedure: ANORECTAL EXAM UNDER ANESTHESIA WITH HEMORRHOIDECTOMY, HEMORRHOIDAL LIGATION/PEXY;  Surgeon: Karie Soda, MD;  Location: [facility];  Service: General;  Laterality: N/A;   eye lid surgery Bilateral    EYE SURGERY     age 36 both eyes muscle, sx at [facility] for seeing double 1 eye   FEMUR IM NAIL Right 10/08/2022   Procedure: RIGHT INTRAMEDULLARY (IM) NAIL FEMORAL;  Surgeon: Ernestina Columbia, MD;  Location[facility];  Service: Orthopedics;  Laterality: Right;   HIP SURGERY Bilateral    left 1988, right done 1988   NASAL ENDOSCOPY     esophagus stretched   NASAL SEPTUM SURGERY     thumb surgery Left    joint replaced   WRIST SURGERY Left    Family History  Problem Relation Age of Onset   Rheum arthritis Mother    Cancer Mother    Heart failure Father    Colitis Sister    Colitis Sister    Colon cancer Maternal Aunt    Colon polyps Neg Hx    Stomach cancer Neg Hx    Esophageal cancer Neg Hx     Social History   Tobacco Use   Smoking  status: Never   Smokeless tobacco: Never  Substance Use Topics   Alcohol use: No   Marital Status: Single  ROS  Review of Systems  Musculoskeletal:  Positive for arthritis, falls and joint pain.   Objective  Blood pressure (!) 159/94, pulse 67, height  (1.473 m), weight 97 lb 12.8 oz (44.4 kg), SpO2 97 %. Body mass index is 20.44 kg/m.     02/23/2023   10:41 AM 02/23/2023   10:37 AM 01/06/2023   10:30 AM  Vitals with BMI  Height     Weight  97 lbs 13 oz 95 lbs  BMI  20.45 19.86  Systolic 159 160 098  Diastolic 94 90 103  Pulse 67  95     Physical Exam Vitals reviewed.  HENT:     Head: Normocephalic and atraumatic.  Neck:     Vascular: No carotid bruit.  Cardiovascular:     Rate and Rhythm: Normal rate and regular rhythm.     Pulses: Normal pulses.     Heart sounds: Murmur heard.  Pulmonary:     Effort: Pulmonary effort is normal.      Breath sounds: Normal breath sounds.  Abdominal:     General: Bowel sounds are normal.  Musculoskeletal:     Right lower leg: No edema.     Left lower leg: No edema.  Skin:    General: Skin is warm and dry.  Neurological:     Mental Status: She is alert.     Medications and allergies   Allergies  Allergen Reactions   Ampicillin Hives and Nausea And Vomiting    Has patient had a PCN reaction causing immediate rash, facial/tongue/throat swelling, SOB or lightheadedness with hypotension: No Has patient had a PCN reaction causing severe rash involving mucus membranes or skin necrosis: No Has patient had a PCN reaction that required hospitalization No Has patient had a PCN reaction occurring within the last 10 years: No If all of the above answers are "NO", then may proceed with Cephalosporin use.   Codeine Nausea And Vomiting   Crestor [Rosuvastatin] Other (See Comments)    Pain in joints   Lipitor [Atorvastatin]     elevated liver enzymes   Nitroglycerin Other (See Comments)    Blood pressure dropped with nitroglycerin cream   Sulfa Antibiotics Hives   Adhesive [Tape] Rash     Medication list after today's encounter   Current Outpatient Medications:    alendronate (FOSAMAX) 70 MG tablet, Take 70 mg by mouth once a week. Tuesdays, Disp: , Rfl:    Cyanocobalamin (VITAMIN B-12 PO), Take 1 tablet by mouth daily., Disp: , Rfl:    diphenhydramine-acetaminophen (TYLENOL PM) 25-500 MG TABS tablet, Take 1 tablet by mouth at bedtime., Disp: , Rfl:    gabapentin (NEURONTIN) 300 MG capsule, Take 300 mg by mouth 2 (two) times daily., Disp: , Rfl:    metoprolol succinate (TOPROL XL) 25 MG 24 hr tablet, Take 1 tablet (25 mg total) by mouth at bedtime., Disp: 90 tablet, Rfl: 3   Multiple Vitamins-Minerals (CENTRUM SILVER ULTRA WOMENS PO), Take 1 tablet by mouth daily., Disp: , Rfl:    omeprazole (PRILOSEC) 40 MG capsule, Take 40 mg by mouth daily., Disp: , Rfl:    tiZANidine (ZANAFLEX) 4 MG  tablet, Take 4 mg by mouth at bedtime., Disp: , Rfl:   Laboratory examination:   Lab Results  Component Value Date   NA 138 10/11/2022   K 3.5 10/11/2022  CO2 20 (L) 10/11/2022   GLUCOSE 89 10/11/2022   BUN 19 10/11/2022   CREATININE 0.63 10/11/2022   CALCIUM 8.2 (L) 10/11/2022   GFRNONAA >60 10/11/2022       Latest Ref Rng & Units 10/11/2022    2:33 AM 10/09/2022    2:35 PM 10/08/2022    3:36 PM  CMP  Glucose 70 - 99 mg/dL 89  098  119   BUN 8 - 23 mg/dL 19  14  14    Creatinine 0.44 - 1.00 mg/dL 1.47  8.29  5.62   Sodium 135 - 145 mmol/L 138  136  141   Potassium 3.5 - 5.1 mmol/L 3.5  3.7  3.0   Chloride 98 - 111 mmol/L 107  107  105   CO2 22 - 32 mmol/L 20  19    Calcium 8.9 - 10.3 mg/dL 8.2  8.3        Latest Ref Rng & Units 10/11/2022    8:25 AM 10/11/2022    2:33 AM 10/09/2022    2:35 PM  CBC  WBC 4.0 - 10.5 K/uL 10.1  10.5  14.9   Hemoglobin 12.0 - 15.0 g/dL 9.7  9.0  13.0   Hematocrit 36.0 - 46.0 % 29.0  28.1  34.3   Platelets 150 - 400 K/uL 208  186  181     Lipid Panel No results for input(s): "CHOL", "TRIG", "LDLCALC", "VLDL", "HDL", "CHOLHDL", "LDLDIRECT" in the last 8760 hours.  HEMOGLOBIN A1C No results found for: "HGBA1C", "MPG" TSH No results for input(s): "TSH" in the last 8760 hours.  External labs:   Labs 11/07/2022: Glucose 98 BUN 17 creatinine 0.56 potassium 4.3 Hemoglobin 11.9 hematocrit 37 Platelets 284   Radiology:    Cardiac Studies:     EKG:   01/06/2023: Sinus Rhythm with PVCs, rate 95 bpm. Incomplete RBBB.  Assessment     ICD-10-CM   1. Paroxysmal atrial fibrillation  I48.0     2. Essential hypertension  I10     3. Syncope and collapse  R55        No orders of the defined types were placed in this encounter.   No orders of the defined types were placed in this encounter.   There are no discontinued medications.    Recommendations:   Valerie Hull is a 82 y.o.  with possible atrial  fibrillation  CHA2DS2VASc = at least 4  Syncope and collapse Last episode of syncope was in 2021 but patient is a fall risk despite not having further episodes of syncope She was admitted to the hospital in 10/2022 after she sustained a major fall going down the stairs I am very hesitant to recommend [facility] for her and she prefers not to be on [facility] as well  Nonspecific abnormal electrocardiogram (ECG) (EKG) Echocardiogram ordered  Essential hypertension Stop amlodipine and start Toprol 25 mg qHS. Encourage low-sodium diet, less than 2000 mg daily.  Paroxysmal atrial fibrillation (HCC) Heart rate should be better controlled on Toprol Will obtain event monitor to assess Afib burden She is very high fall risk and sustained major fractures a few months ago after she fell We will not start [facility] as she admits she falls a lot and her falls are severe Follow-up in 1-2 months or sooner if needed    Clotilde Dieter, DO, [facility]  02/23/2023, 10:45 AM Office: [phone] Pager: [phone]

## 2023-02-24 DIAGNOSIS — Z79899 Other long term (current) drug therapy: Secondary | ICD-10-CM | POA: Diagnosis not present

## 2023-02-24 DIAGNOSIS — E559 Vitamin D deficiency, unspecified: Secondary | ICD-10-CM | POA: Diagnosis not present

## 2023-02-24 DIAGNOSIS — Z1389 Encounter for screening for other disorder: Secondary | ICD-10-CM | POA: Diagnosis not present

## 2023-02-24 DIAGNOSIS — R5383 Other fatigue: Secondary | ICD-10-CM | POA: Diagnosis not present

## 2023-02-24 DIAGNOSIS — Z0000 Encounter for general adult medical examination without abnormal findings: Secondary | ICD-10-CM | POA: Diagnosis not present

## 2023-02-24 DIAGNOSIS — K219 Gastro-esophageal reflux disease without esophagitis: Secondary | ICD-10-CM | POA: Diagnosis not present

## 2023-02-24 DIAGNOSIS — R946 Abnormal results of thyroid function studies: Secondary | ICD-10-CM | POA: Diagnosis not present

## 2023-02-24 DIAGNOSIS — Z6820 Body mass index (BMI) 20.0-20.9, adult: Secondary | ICD-10-CM | POA: Diagnosis not present

## 2023-02-24 DIAGNOSIS — M5416 Radiculopathy, lumbar region: Secondary | ICD-10-CM | POA: Diagnosis not present

## 2023-02-24 DIAGNOSIS — Z23 Encounter for immunization: Secondary | ICD-10-CM | POA: Diagnosis not present

## 2023-02-24 DIAGNOSIS — M810 Age-related osteoporosis without current pathological fracture: Secondary | ICD-10-CM | POA: Diagnosis not present

## 2023-02-25 DIAGNOSIS — M80051D Age-related osteoporosis with current pathological fracture, right femur, subsequent encounter for fracture with routine healing: Secondary | ICD-10-CM | POA: Diagnosis not present

## 2023-02-25 DIAGNOSIS — R262 Difficulty in walking, not elsewhere classified: Secondary | ICD-10-CM | POA: Diagnosis not present

## 2023-02-25 DIAGNOSIS — M6281 Muscle weakness (generalized): Secondary | ICD-10-CM | POA: Diagnosis not present

## 2023-03-02 DIAGNOSIS — M80051D Age-related osteoporosis with current pathological fracture, right femur, subsequent encounter for fracture with routine healing: Secondary | ICD-10-CM | POA: Diagnosis not present

## 2023-03-02 DIAGNOSIS — M6281 Muscle weakness (generalized): Secondary | ICD-10-CM | POA: Diagnosis not present

## 2023-03-02 DIAGNOSIS — R262 Difficulty in walking, not elsewhere classified: Secondary | ICD-10-CM | POA: Diagnosis not present

## 2023-03-04 DIAGNOSIS — S52501D Unspecified fracture of the lower end of right radius, subsequent encounter for closed fracture with routine healing: Secondary | ICD-10-CM | POA: Diagnosis not present

## 2023-03-09 DIAGNOSIS — M6281 Muscle weakness (generalized): Secondary | ICD-10-CM | POA: Diagnosis not present

## 2023-03-09 DIAGNOSIS — R262 Difficulty in walking, not elsewhere classified: Secondary | ICD-10-CM | POA: Diagnosis not present

## 2023-03-09 DIAGNOSIS — M80051D Age-related osteoporosis with current pathological fracture, right femur, subsequent encounter for fracture with routine healing: Secondary | ICD-10-CM | POA: Diagnosis not present

## 2023-03-11 DIAGNOSIS — M6281 Muscle weakness (generalized): Secondary | ICD-10-CM | POA: Diagnosis not present

## 2023-03-11 DIAGNOSIS — R262 Difficulty in walking, not elsewhere classified: Secondary | ICD-10-CM | POA: Diagnosis not present

## 2023-03-11 DIAGNOSIS — M80051D Age-related osteoporosis with current pathological fracture, right femur, subsequent encounter for fracture with routine healing: Secondary | ICD-10-CM | POA: Diagnosis not present

## 2023-03-16 DIAGNOSIS — M6281 Muscle weakness (generalized): Secondary | ICD-10-CM | POA: Diagnosis not present

## 2023-03-16 DIAGNOSIS — R262 Difficulty in walking, not elsewhere classified: Secondary | ICD-10-CM | POA: Diagnosis not present

## 2023-03-16 DIAGNOSIS — M80051D Age-related osteoporosis with current pathological fracture, right femur, subsequent encounter for fracture with routine healing: Secondary | ICD-10-CM | POA: Diagnosis not present

## 2023-03-17 DIAGNOSIS — M5416 Radiculopathy, lumbar region: Secondary | ICD-10-CM | POA: Diagnosis not present

## 2023-03-20 DIAGNOSIS — S52501D Unspecified fracture of the lower end of right radius, subsequent encounter for closed fracture with routine healing: Secondary | ICD-10-CM | POA: Diagnosis not present

## 2023-03-23 DIAGNOSIS — R262 Difficulty in walking, not elsewhere classified: Secondary | ICD-10-CM | POA: Diagnosis not present

## 2023-03-23 DIAGNOSIS — M6281 Muscle weakness (generalized): Secondary | ICD-10-CM | POA: Diagnosis not present

## 2023-03-23 DIAGNOSIS — M80051D Age-related osteoporosis with current pathological fracture, right femur, subsequent encounter for fracture with routine healing: Secondary | ICD-10-CM | POA: Diagnosis not present

## 2023-03-25 DIAGNOSIS — R262 Difficulty in walking, not elsewhere classified: Secondary | ICD-10-CM | POA: Diagnosis not present

## 2023-03-25 DIAGNOSIS — M6281 Muscle weakness (generalized): Secondary | ICD-10-CM | POA: Diagnosis not present

## 2023-03-25 DIAGNOSIS — M80051D Age-related osteoporosis with current pathological fracture, right femur, subsequent encounter for fracture with routine healing: Secondary | ICD-10-CM | POA: Diagnosis not present

## 2023-03-31 DIAGNOSIS — R262 Difficulty in walking, not elsewhere classified: Secondary | ICD-10-CM | POA: Diagnosis not present

## 2023-03-31 DIAGNOSIS — M80051D Age-related osteoporosis with current pathological fracture, right femur, subsequent encounter for fracture with routine healing: Secondary | ICD-10-CM | POA: Diagnosis not present

## 2023-03-31 DIAGNOSIS — M6281 Muscle weakness (generalized): Secondary | ICD-10-CM | POA: Diagnosis not present

## 2023-04-01 DIAGNOSIS — M79651 Pain in right thigh: Secondary | ICD-10-CM | POA: Diagnosis not present

## 2023-04-03 DIAGNOSIS — S52501D Unspecified fracture of the lower end of right radius, subsequent encounter for closed fracture with routine healing: Secondary | ICD-10-CM | POA: Diagnosis not present

## 2023-04-08 DIAGNOSIS — R262 Difficulty in walking, not elsewhere classified: Secondary | ICD-10-CM | POA: Diagnosis not present

## 2023-04-08 DIAGNOSIS — M80051D Age-related osteoporosis with current pathological fracture, right femur, subsequent encounter for fracture with routine healing: Secondary | ICD-10-CM | POA: Diagnosis not present

## 2023-04-08 DIAGNOSIS — M6281 Muscle weakness (generalized): Secondary | ICD-10-CM | POA: Diagnosis not present

## 2023-04-10 DIAGNOSIS — M80051D Age-related osteoporosis with current pathological fracture, right femur, subsequent encounter for fracture with routine healing: Secondary | ICD-10-CM | POA: Diagnosis not present

## 2023-04-10 DIAGNOSIS — R262 Difficulty in walking, not elsewhere classified: Secondary | ICD-10-CM | POA: Diagnosis not present

## 2023-04-10 DIAGNOSIS — M6281 Muscle weakness (generalized): Secondary | ICD-10-CM | POA: Diagnosis not present

## 2023-04-13 DIAGNOSIS — M6281 Muscle weakness (generalized): Secondary | ICD-10-CM | POA: Diagnosis not present

## 2023-04-13 DIAGNOSIS — R262 Difficulty in walking, not elsewhere classified: Secondary | ICD-10-CM | POA: Diagnosis not present

## 2023-04-13 DIAGNOSIS — M80051D Age-related osteoporosis with current pathological fracture, right femur, subsequent encounter for fracture with routine healing: Secondary | ICD-10-CM | POA: Diagnosis not present

## 2023-04-15 DIAGNOSIS — M6281 Muscle weakness (generalized): Secondary | ICD-10-CM | POA: Diagnosis not present

## 2023-04-15 DIAGNOSIS — R262 Difficulty in walking, not elsewhere classified: Secondary | ICD-10-CM | POA: Diagnosis not present

## 2023-04-15 DIAGNOSIS — M80051D Age-related osteoporosis with current pathological fracture, right femur, subsequent encounter for fracture with routine healing: Secondary | ICD-10-CM | POA: Diagnosis not present

## 2023-04-17 DIAGNOSIS — H52203 Unspecified astigmatism, bilateral: Secondary | ICD-10-CM | POA: Diagnosis not present

## 2023-04-17 DIAGNOSIS — Z961 Presence of intraocular lens: Secondary | ICD-10-CM | POA: Diagnosis not present

## 2023-04-17 DIAGNOSIS — H5000 Unspecified esotropia: Secondary | ICD-10-CM | POA: Diagnosis not present

## 2023-04-27 DIAGNOSIS — M80051D Age-related osteoporosis with current pathological fracture, right femur, subsequent encounter for fracture with routine healing: Secondary | ICD-10-CM | POA: Diagnosis not present

## 2023-04-27 DIAGNOSIS — R262 Difficulty in walking, not elsewhere classified: Secondary | ICD-10-CM | POA: Diagnosis not present

## 2023-04-27 DIAGNOSIS — M6281 Muscle weakness (generalized): Secondary | ICD-10-CM | POA: Diagnosis not present

## 2023-04-29 DIAGNOSIS — M80051D Age-related osteoporosis with current pathological fracture, right femur, subsequent encounter for fracture with routine healing: Secondary | ICD-10-CM | POA: Diagnosis not present

## 2023-04-29 DIAGNOSIS — M6281 Muscle weakness (generalized): Secondary | ICD-10-CM | POA: Diagnosis not present

## 2023-04-29 DIAGNOSIS — R262 Difficulty in walking, not elsewhere classified: Secondary | ICD-10-CM | POA: Diagnosis not present

## 2023-05-05 DIAGNOSIS — M80051D Age-related osteoporosis with current pathological fracture, right femur, subsequent encounter for fracture with routine healing: Secondary | ICD-10-CM | POA: Diagnosis not present

## 2023-05-05 DIAGNOSIS — R262 Difficulty in walking, not elsewhere classified: Secondary | ICD-10-CM | POA: Diagnosis not present

## 2023-05-05 DIAGNOSIS — M6281 Muscle weakness (generalized): Secondary | ICD-10-CM | POA: Diagnosis not present

## 2023-05-06 DIAGNOSIS — M6281 Muscle weakness (generalized): Secondary | ICD-10-CM | POA: Diagnosis not present

## 2023-05-06 DIAGNOSIS — M80051D Age-related osteoporosis with current pathological fracture, right femur, subsequent encounter for fracture with routine healing: Secondary | ICD-10-CM | POA: Diagnosis not present

## 2023-05-06 DIAGNOSIS — R262 Difficulty in walking, not elsewhere classified: Secondary | ICD-10-CM | POA: Diagnosis not present

## 2023-05-08 DIAGNOSIS — M79651 Pain in right thigh: Secondary | ICD-10-CM | POA: Diagnosis not present

## 2023-05-13 DIAGNOSIS — M6281 Muscle weakness (generalized): Secondary | ICD-10-CM | POA: Diagnosis not present

## 2023-05-13 DIAGNOSIS — R262 Difficulty in walking, not elsewhere classified: Secondary | ICD-10-CM | POA: Diagnosis not present

## 2023-05-13 DIAGNOSIS — M80051D Age-related osteoporosis with current pathological fracture, right femur, subsequent encounter for fracture with routine healing: Secondary | ICD-10-CM | POA: Diagnosis not present

## 2023-05-15 DIAGNOSIS — R262 Difficulty in walking, not elsewhere classified: Secondary | ICD-10-CM | POA: Diagnosis not present

## 2023-05-15 DIAGNOSIS — M6281 Muscle weakness (generalized): Secondary | ICD-10-CM | POA: Diagnosis not present

## 2023-05-15 DIAGNOSIS — M80051D Age-related osteoporosis with current pathological fracture, right femur, subsequent encounter for fracture with routine healing: Secondary | ICD-10-CM | POA: Diagnosis not present

## 2023-05-18 DIAGNOSIS — M6281 Muscle weakness (generalized): Secondary | ICD-10-CM | POA: Diagnosis not present

## 2023-05-18 DIAGNOSIS — R262 Difficulty in walking, not elsewhere classified: Secondary | ICD-10-CM | POA: Diagnosis not present

## 2023-05-18 DIAGNOSIS — M80051D Age-related osteoporosis with current pathological fracture, right femur, subsequent encounter for fracture with routine healing: Secondary | ICD-10-CM | POA: Diagnosis not present

## 2023-05-20 DIAGNOSIS — M80051D Age-related osteoporosis with current pathological fracture, right femur, subsequent encounter for fracture with routine healing: Secondary | ICD-10-CM | POA: Diagnosis not present

## 2023-05-20 DIAGNOSIS — R262 Difficulty in walking, not elsewhere classified: Secondary | ICD-10-CM | POA: Diagnosis not present

## 2023-05-20 DIAGNOSIS — M6281 Muscle weakness (generalized): Secondary | ICD-10-CM | POA: Diagnosis not present

## 2023-05-25 DIAGNOSIS — M80051D Age-related osteoporosis with current pathological fracture, right femur, subsequent encounter for fracture with routine healing: Secondary | ICD-10-CM | POA: Diagnosis not present

## 2023-05-25 DIAGNOSIS — R262 Difficulty in walking, not elsewhere classified: Secondary | ICD-10-CM | POA: Diagnosis not present

## 2023-05-25 DIAGNOSIS — M6281 Muscle weakness (generalized): Secondary | ICD-10-CM | POA: Diagnosis not present

## 2023-05-27 DIAGNOSIS — M6281 Muscle weakness (generalized): Secondary | ICD-10-CM | POA: Diagnosis not present

## 2023-05-27 DIAGNOSIS — S336XXD Sprain of sacroiliac joint, subsequent encounter: Secondary | ICD-10-CM | POA: Diagnosis not present

## 2023-06-02 DIAGNOSIS — M80051D Age-related osteoporosis with current pathological fracture, right femur, subsequent encounter for fracture with routine healing: Secondary | ICD-10-CM | POA: Diagnosis not present

## 2023-06-02 DIAGNOSIS — R262 Difficulty in walking, not elsewhere classified: Secondary | ICD-10-CM | POA: Diagnosis not present

## 2023-06-02 DIAGNOSIS — M6281 Muscle weakness (generalized): Secondary | ICD-10-CM | POA: Diagnosis not present

## 2023-06-05 DIAGNOSIS — R262 Difficulty in walking, not elsewhere classified: Secondary | ICD-10-CM | POA: Diagnosis not present

## 2023-06-05 DIAGNOSIS — M80051D Age-related osteoporosis with current pathological fracture, right femur, subsequent encounter for fracture with routine healing: Secondary | ICD-10-CM | POA: Diagnosis not present

## 2023-06-05 DIAGNOSIS — M6281 Muscle weakness (generalized): Secondary | ICD-10-CM | POA: Diagnosis not present

## 2023-06-08 DIAGNOSIS — M6281 Muscle weakness (generalized): Secondary | ICD-10-CM | POA: Diagnosis not present

## 2023-06-08 DIAGNOSIS — R262 Difficulty in walking, not elsewhere classified: Secondary | ICD-10-CM | POA: Diagnosis not present

## 2023-06-08 DIAGNOSIS — M80051D Age-related osteoporosis with current pathological fracture, right femur, subsequent encounter for fracture with routine healing: Secondary | ICD-10-CM | POA: Diagnosis not present

## 2023-06-15 DIAGNOSIS — R262 Difficulty in walking, not elsewhere classified: Secondary | ICD-10-CM | POA: Diagnosis not present

## 2023-06-15 DIAGNOSIS — M6281 Muscle weakness (generalized): Secondary | ICD-10-CM | POA: Diagnosis not present

## 2023-06-15 DIAGNOSIS — M80051D Age-related osteoporosis with current pathological fracture, right femur, subsequent encounter for fracture with routine healing: Secondary | ICD-10-CM | POA: Diagnosis not present

## 2023-06-17 DIAGNOSIS — M6281 Muscle weakness (generalized): Secondary | ICD-10-CM | POA: Diagnosis not present

## 2023-06-17 DIAGNOSIS — R262 Difficulty in walking, not elsewhere classified: Secondary | ICD-10-CM | POA: Diagnosis not present

## 2023-06-17 DIAGNOSIS — M80051D Age-related osteoporosis with current pathological fracture, right femur, subsequent encounter for fracture with routine healing: Secondary | ICD-10-CM | POA: Diagnosis not present

## 2023-06-18 DIAGNOSIS — M5416 Radiculopathy, lumbar region: Secondary | ICD-10-CM | POA: Diagnosis not present

## 2023-06-22 DIAGNOSIS — M80051D Age-related osteoporosis with current pathological fracture, right femur, subsequent encounter for fracture with routine healing: Secondary | ICD-10-CM | POA: Diagnosis not present

## 2023-06-22 DIAGNOSIS — R262 Difficulty in walking, not elsewhere classified: Secondary | ICD-10-CM | POA: Diagnosis not present

## 2023-06-22 DIAGNOSIS — M6281 Muscle weakness (generalized): Secondary | ICD-10-CM | POA: Diagnosis not present

## 2023-06-24 DIAGNOSIS — M6281 Muscle weakness (generalized): Secondary | ICD-10-CM | POA: Diagnosis not present

## 2023-06-24 DIAGNOSIS — M80051D Age-related osteoporosis with current pathological fracture, right femur, subsequent encounter for fracture with routine healing: Secondary | ICD-10-CM | POA: Diagnosis not present

## 2023-06-24 DIAGNOSIS — R262 Difficulty in walking, not elsewhere classified: Secondary | ICD-10-CM | POA: Diagnosis not present

## 2023-07-21 DIAGNOSIS — M5416 Radiculopathy, lumbar region: Secondary | ICD-10-CM | POA: Diagnosis not present

## 2023-07-21 DIAGNOSIS — M4120 Other idiopathic scoliosis, site unspecified: Secondary | ICD-10-CM | POA: Diagnosis not present

## 2023-07-27 DIAGNOSIS — Z1231 Encounter for screening mammogram for malignant neoplasm of breast: Secondary | ICD-10-CM | POA: Diagnosis not present

## 2023-08-03 DIAGNOSIS — M47816 Spondylosis without myelopathy or radiculopathy, lumbar region: Secondary | ICD-10-CM | POA: Diagnosis not present

## 2023-08-09 ENCOUNTER — Emergency Department (HOSPITAL_COMMUNITY): Payer: Medicare Other

## 2023-08-09 ENCOUNTER — Other Ambulatory Visit: Payer: Self-pay

## 2023-08-09 ENCOUNTER — Emergency Department (HOSPITAL_COMMUNITY)
Admission: EM | Admit: 2023-08-09 | Discharge: 2023-08-10 | Disposition: A | Discharge status: Home / Self Care | Payer: Medicare Other | Attending: Emergency Medicine | Admitting: Emergency Medicine

## 2023-08-09 ENCOUNTER — Encounter (HOSPITAL_COMMUNITY): Payer: Self-pay

## 2023-08-09 DIAGNOSIS — M25561 Pain in right knee: Secondary | ICD-10-CM | POA: Insufficient documentation

## 2023-08-09 DIAGNOSIS — W010XXA Fall on same level from slipping, tripping and stumbling without subsequent striking against object, initial encounter: Secondary | ICD-10-CM | POA: Diagnosis not present

## 2023-08-09 DIAGNOSIS — M79604 Pain in right leg: Secondary | ICD-10-CM

## 2023-08-09 DIAGNOSIS — M25551 Pain in right hip: Secondary | ICD-10-CM | POA: Diagnosis not present

## 2023-08-09 DIAGNOSIS — S8991XA Unspecified injury of right lower leg, initial encounter: Secondary | ICD-10-CM | POA: Diagnosis not present

## 2023-08-09 DIAGNOSIS — W19XXXA Unspecified fall, initial encounter: Secondary | ICD-10-CM

## 2023-08-09 DIAGNOSIS — Z79899 Other long term (current) drug therapy: Secondary | ICD-10-CM | POA: Insufficient documentation

## 2023-08-09 MED ORDER — ACETAMINOPHEN 500 MG PO TABS
1000.0000 mg | ORAL_TABLET | Freq: Once | ORAL | Status: AC
  Administered 2023-08-09: 1000 mg via ORAL
  Filled 2023-08-09: qty 2

## 2023-08-09 NOTE — ED Provider Notes (Signed)
Valerie Hull Provider Note   CSN: [id number] Arrival date & time: 08/09/23  1836     History {Add pertinent medical, surgical, social history, OB history to HPI:1} Chief Complaint  Patient presents with   Valerie Hull is a 82 y.o. female.  Patient presents to the Emergency Department complaining of a mechanical fall.  Patient states she was standing next to her bed and her feet got entangled at the desk truffle.  She fell falling on the right side and complains mostly of right knee pain.  Patient has additional concerns about her right ankle which is painful and her right femur.  The patient reports intramedullary fixation of the right femur which was done in December of last year which had delayed healing.  She denies hitting her head, denies blood thinner usage.  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 Authorizing Provider  alendronate (FOSAMAX) 70 MG tablet Take 70 mg by mouth once a week. Tuesdays 09/12/22   [provider]  Cyanocobalamin (VITAMIN B-12 PO) Take 1 tablet by mouth daily.    [provider]  diphenhydramine-acetaminophen (TYLENOL PM) 25-500 MG TABS tablet Take 1 tablet by mouth at bedtime. 11/06/17   [provider]  gabapentin (NEURONTIN) 300 MG capsule Take 300 mg by mouth 2 (two) times daily.    [provider]  metoprolol succinate (TOPROL XL) 25 MG 24 hr tablet Take 1 tablet (25 mg total) by mouth at bedtime. 01/06/23   Custovic, Rozell Searing, DO  Multiple Vitamins-Minerals (CENTRUM SILVER ULTRA WOMENS PO) Take 1 tablet by mouth daily.    [provider]  omeprazole (PRILOSEC) 40 MG capsule Take 40 mg by mouth daily.    [provider]  tiZANidine (ZANAFLEX) 4 MG tablet Take 4 mg by  mouth at bedtime.    [provider]      Allergies    Ampicillin, Codeine, Crestor [rosuvastatin], Lipitor [atorvastatin], Nitroglycerin, Sulfa antibiotics, and Adhesive [tape]    Review of Systems   Review of Systems  Physical Exam Updated Vital Signs BP (!) 139/95 (BP Location: Left Arm)   Pulse (!) 119   Temp 98.2 F (36.8 C) (Oral)   Resp 18   SpO2 97%  Physical Exam Vitals and nursing note reviewed.  Constitutional:      General: She is not in acute distress.    Appearance: She is well-developed.  HENT:     Head: Normocephalic and atraumatic.  Eyes:     Conjunctiva/sclera: Conjunctivae normal.  Cardiovascular:     Rate and Rhythm: Normal rate.  Pulmonary:     Effort: Pulmonary effort is normal. No respiratory distress.     Breath sounds: Normal breath sounds.  Abdominal:     Palpations: Abdomen is soft.     Tenderness: There is no abdominal tenderness.  Musculoskeletal:        General: Tenderness present. No swelling or deformity.     Cervical back: Neck supple.     Comments: Right lower extremity does not appear to be rotated or shortened.  Patient with significant pain with palpation midline just inferior to the right patella.  Patient unable to participate with range of motion testing at this time due to pain. Patient able to move right  foot/ankle and able to extend and flex right great toe.  Palpable pedal pulse.  Skin:    General: Skin is warm and dry.     Capillary Refill: Capillary refill takes less than 2 seconds.  Neurological:     Mental Status: She is alert.  Psychiatric:        Mood and Affect: Mood normal.     ED Results / Procedures / Treatments   Labs (all labs ordered are listed, but only abnormal results are displayed) Labs Reviewed - No data to display  EKG None  Radiology No results found.  Procedures Procedures  {Document cardiac monitor, telemetry assessment procedure when appropriate:1}  Medications Ordered in  ED Medications  acetaminophen (TYLENOL) tablet 1,000 mg (1,000 mg Oral Given 08/09/23 2231)    ED Course/ Medical Decision Making/ A&P   {   Click here for ABCD2, HEART and other calculatorsREFRESH Note before signing :1}                              Medical Decision Making Amount and/or Complexity of Data Reviewed Radiology: ordered.  Risk OTC drugs.   This patient presents to the ED for concern of right leg pain post fall, this involves an extensive number of treatment options, and is a complaint that carries with it a high risk of complications and morbidity.  The differential diagnosis includes fracture, dislocation, soft tissue injury, others   Co morbidities that complicate the patient evaluation  Osteoporosis, previous right hip and right femur surgery   Additional history obtained:  Additional history obtained from visitor at bedside External records from outside source obtained and reviewed including discharge summary from [facility] orthopedic from last year after patient had intramedullary fixation of the right femur   Lab Tests:  I Ordered, and personally interpreted labs.  The pertinent results include:  ***   Imaging Studies ordered:  I ordered imaging studies including plain films of the right hip, knee, tibia-fibula, and ankle I independently visualized and interpreted imaging which showed  No acute fracture.    IM rod and screw fixation across a healing proximal right femur  fracture.  No acute fracture noted in the right knee, tibia-fibula, ankle I agree with the radiologist interpretation   Problem List / ED Course / Critical interventions / Medication management   I ordered medication including Tylenol for pain Reevaluation of the patient after these medicines showed that the patient improved I have reviewed the patients home medicines and have made adjustments as needed  Test / Admission - Considered:  ***   {Document critical care time  when appropriate:1} {Document review of labs and clinical decision tools ie heart score, Chads2Vasc2 etc:1}  {Document your independent review of radiology images, and any outside records:1} {Document your discussion with family members, caretakers, and with consultants:1} {Document social determinants of health affecting pt's care:1} {Document your decision making why or why not admission, treatments were needed:1} Final Clinical Impression(s) / ED Diagnoses Final diagnoses:  None    Rx / DC Orders ED Discharge Orders     None

## 2023-08-09 NOTE — ED Triage Notes (Signed)
Patient reports her feet were tangled in blankets this AM and tripped and fell. Patient c/o right knee pain. Patient denies hitting head. Does not take blood thinners.

## 2023-08-10 ENCOUNTER — Emergency Department (HOSPITAL_COMMUNITY): Payer: Medicare Other

## 2023-08-10 DIAGNOSIS — M25561 Pain in right knee: Secondary | ICD-10-CM | POA: Diagnosis not present

## 2023-08-10 DIAGNOSIS — S8991XA Unspecified injury of right lower leg, initial encounter: Secondary | ICD-10-CM | POA: Diagnosis not present

## 2023-08-10 NOTE — ED Notes (Addendum)
Pt ambulated to the bathroom and back w/ RW per providers order.Pt states that it still hurts when she bares weight to her RLE but nothing like it did when she first got here and is comfortable with being d/c home.

## 2023-08-10 NOTE — Discharge Instructions (Signed)
Your workup tonight was reassuring with no signs of acute fracture or dislocation.  Please continue to ambulate using your walker.  I recommend over-the-counter medication such as acetaminophen for pain control.  Please schedule a follow-up appointment with orthopedics as necessary for further evaluation of your right leg pain.

## 2023-08-18 DIAGNOSIS — S7291XA Unspecified fracture of right femur, initial encounter for closed fracture: Secondary | ICD-10-CM | POA: Diagnosis not present

## 2023-08-18 DIAGNOSIS — S7291XD Unspecified fracture of right femur, subsequent encounter for closed fracture with routine healing: Secondary | ICD-10-CM | POA: Diagnosis not present

## 2023-08-18 DIAGNOSIS — M25561 Pain in right knee: Secondary | ICD-10-CM | POA: Diagnosis not present

## 2023-08-24 ENCOUNTER — Ambulatory Visit: Payer: Medicare Other | Attending: Cardiology | Admitting: Cardiology

## 2023-08-24 ENCOUNTER — Encounter: Payer: Self-pay | Admitting: Cardiology

## 2023-08-24 VITALS — BP 138/80 | HR 61 | Resp 16 | Ht <= 58 in | Wt 101.0 lb

## 2023-08-24 DIAGNOSIS — I499 Cardiac arrhythmia, unspecified: Secondary | ICD-10-CM | POA: Insufficient documentation

## 2023-08-24 DIAGNOSIS — I10 Essential (primary) hypertension: Secondary | ICD-10-CM | POA: Diagnosis not present

## 2023-08-24 DIAGNOSIS — E782 Mixed hyperlipidemia: Secondary | ICD-10-CM | POA: Diagnosis not present

## 2023-08-24 DIAGNOSIS — I4719 Other supraventricular tachycardia: Secondary | ICD-10-CM | POA: Diagnosis not present

## 2023-08-24 NOTE — Patient Instructions (Addendum)
Medication Instructions:   Your physician recommends that you continue on your current medications as directed. Please refer to the Current Medication list given to you today.  *If you need a refill on your cardiac medications before your next appointment, please call your pharmacy*    Labs:  SAME DAY YOU COME IN TO HAVE YOUR MONITOR PLACED--LIPIDS--PLEASE COME FASTING TO THIS LAB APPOINTMENT   Testing/Procedures:  Your physician has recommended that you wear an 30 DAY event monitor. Event monitors are medical devices that record the heart's electrical activity. Doctors most often [place] these monitors to diagnose arrhythmias. Arrhythmias are problems with the speed or rhythm of the heartbeat. The monitor is a small, portable device. You can wear one while you do your normal daily activities. This is usually used to diagnose what is causing palpitations/syncope (passing out).  THIS WILL NEED TO BE A SCHEDULED APPOINTMENT FOR IN CLINIC PLACEMENT OF MONITOR WITH SHELLY OR KATIE    Follow-Up: At [facility], you and your health needs are our priority.  As part of our continuing mission to provide you with exceptional heart care, we have created designated Provider Care Teams.  These Care Teams include your primary Cardiologist (physician) and Advanced Practice Providers (APPs -  Physician Assistants and Nurse Practitioners) who all work together to provide you with the care you need, when you need it.  We recommend signing up for the patient portal called "MyChart".  Sign up information is provided on this After Visit Summary.  MyChart is used to connect with patients for Virtual Visits (Telemedicine).  Patients are able to view lab/test results, encounter notes, upcoming appointments, etc.  Non-urgent messages can be sent to your provider as well.   To learn more about what you can do with MyChart, go to [email or web address].    Your next appointment:   6 month(s)  Provider:   DR.  Rosemary Holms

## 2023-08-24 NOTE — Progress Notes (Signed)
Cardiology Office Note:  .   Date:  08/24/2023  ID:  Valerie Hull, DOB 13-Jan-1941, MRN [id number] PCP: Sigmund Hazel, MD  [facility] Providers Cardiologist:  Truett Mainland, MD PCP: Sigmund Hazel, MD  Chief Complaint  Patient presents with   Essential hypertension   Follow-up    6 months       History of Present Illness: Marland Kitchen    Valerie Hull is a 82 y.o. female with paroxysmal atrial tachycardia/PSVT, hyperlipidemia, frequent falls  Patient was previously seen by Dr. Melton Alar.  Recently, she has had recurrent falls leading to femur fracture.  She is still recovering from the same and uses a walker.  She is otherwise quite independent and lives alone.  She is going on vacation in 2 weeks.  There has been mention of A-fib on her chart in the past.  She was reportedly told to have A-fib at least at some point.  However, on thorough chart review, have not come across any EKGs or monitors that definitively showed A-fib.  She has been noted to have atrial tachycardia and SVT on monitor placed in 02/2023.  Her Apple Watch continues to show about 5% A-fib burden.  She is fairly reluctant to go on anticoagulation given her frequent falls, but is not opposed to idea of left atrial appendage closure, should it be deemed necessary.  Vitals:   08/24/23 0928  BP: 138/80  Pulse: 61  Resp: 16  SpO2: 97%     ROS:  Review of Systems  Cardiovascular:  Negative for chest pain, dyspnea on exertion, leg swelling, palpitations and syncope.  Musculoskeletal:  Positive for falls.     Studies Reviewed: Marland Kitchen       EKG 08/24/2023: Normal sinus rhythm Normal ECG No previous ECGs available    Lipid panel 10/2021: Chol 265, TG 99, HDL 61, LDL 187   Risk Assessment/Calculations:    CHA2DS2-VASc Score = 4  This indicates a 4.8 % annual risk of stroke.   Physical Exam:   Physical Exam Vitals and nursing note reviewed.  Constitutional:      General: She is not in acute  distress. Neck:     Vascular: No JVD.  Cardiovascular:     Rate and Rhythm: Normal rate and regular rhythm.     Heart sounds: Normal heart sounds. No murmur heard. Pulmonary:     Effort: Pulmonary effort is normal.     Breath sounds: Normal breath sounds. No wheezing or rales.  Musculoskeletal:     Right lower leg: No edema.     Left lower leg: No edema.      VISIT DIAGNOSES:   ICD-10-CM   1. Essential hypertension  I10 EKG 12-Lead    2. Atrial tachycardia (HCC)  I47.19        ASSESSMENT AND PLAN: .    Valerie Hull is a 82 y.o. female with paroxysmal atrial tachycardia/PSVT, hyperlipidemia, frequent falls  Atrial arrhythmia: Noted atrial tachycardia/SVT on monitor, but no documented evidence of A-fib on my chart review. Her Apple Watch continues to show A-fib at multiple times. Should she have A-fib, following is her stroke risk prediction: CHA2DS2-VASc Score = 4  This indicates a 4.8 % annual risk of stroke. I will place her on another 30-day event monitor, which she would like to start in November after her vacation. She is not keen on having loop recorder placed.  Hypertension: Well-controlled  Mixed hyperlipidemia: I noticed this after patient's visit today.  She  is not on a statin.  LDL was 187, back in 10/2021 not checked since then.  Will repeat lipid panel   F/u in 6 months  Signed, Elder Negus, MD

## 2023-08-25 DIAGNOSIS — R296 Repeated falls: Secondary | ICD-10-CM | POA: Diagnosis not present

## 2023-08-25 DIAGNOSIS — K219 Gastro-esophageal reflux disease without esophagitis: Secondary | ICD-10-CM | POA: Diagnosis not present

## 2023-08-25 DIAGNOSIS — Z6821 Body mass index (BMI) 21.0-21.9, adult: Secondary | ICD-10-CM | POA: Diagnosis not present

## 2023-08-25 DIAGNOSIS — Z9989 Dependence on other enabling machines and devices: Secondary | ICD-10-CM | POA: Diagnosis not present

## 2023-08-25 DIAGNOSIS — I700 Atherosclerosis of aorta: Secondary | ICD-10-CM | POA: Diagnosis not present

## 2023-08-25 DIAGNOSIS — R531 Weakness: Secondary | ICD-10-CM | POA: Diagnosis not present

## 2023-08-25 DIAGNOSIS — M810 Age-related osteoporosis without current pathological fracture: Secondary | ICD-10-CM | POA: Diagnosis not present

## 2023-08-25 DIAGNOSIS — Z23 Encounter for immunization: Secondary | ICD-10-CM | POA: Diagnosis not present

## 2023-08-25 DIAGNOSIS — I10 Essential (primary) hypertension: Secondary | ICD-10-CM | POA: Diagnosis not present

## 2023-08-26 ENCOUNTER — Ambulatory Visit: Payer: Medicare Other | Admitting: Cardiology

## 2023-08-26 DIAGNOSIS — M47816 Spondylosis without myelopathy or radiculopathy, lumbar region: Secondary | ICD-10-CM | POA: Diagnosis not present

## 2023-08-28 DIAGNOSIS — M2560 Stiffness of unspecified joint, not elsewhere classified: Secondary | ICD-10-CM | POA: Diagnosis not present

## 2023-08-28 DIAGNOSIS — R296 Repeated falls: Secondary | ICD-10-CM | POA: Diagnosis not present

## 2023-08-28 DIAGNOSIS — R2689 Other abnormalities of gait and mobility: Secondary | ICD-10-CM | POA: Diagnosis not present

## 2023-08-31 DIAGNOSIS — M2560 Stiffness of unspecified joint, not elsewhere classified: Secondary | ICD-10-CM | POA: Diagnosis not present

## 2023-08-31 DIAGNOSIS — R296 Repeated falls: Secondary | ICD-10-CM | POA: Diagnosis not present

## 2023-08-31 DIAGNOSIS — R2689 Other abnormalities of gait and mobility: Secondary | ICD-10-CM | POA: Diagnosis not present

## 2023-09-01 ENCOUNTER — Ambulatory Visit: Payer: Medicare Other | Attending: Cardiology

## 2023-09-01 ENCOUNTER — Other Ambulatory Visit: Payer: Self-pay | Admitting: Cardiology

## 2023-09-01 DIAGNOSIS — I499 Cardiac arrhythmia, unspecified: Secondary | ICD-10-CM | POA: Insufficient documentation

## 2023-09-01 DIAGNOSIS — S7291XD Unspecified fracture of right femur, subsequent encounter for closed fracture with routine healing: Secondary | ICD-10-CM | POA: Diagnosis not present

## 2023-09-01 DIAGNOSIS — I471 Supraventricular tachycardia, unspecified: Secondary | ICD-10-CM

## 2023-09-01 DIAGNOSIS — R296 Repeated falls: Secondary | ICD-10-CM | POA: Diagnosis not present

## 2023-09-01 DIAGNOSIS — E782 Mixed hyperlipidemia: Secondary | ICD-10-CM

## 2023-09-01 DIAGNOSIS — I4719 Other supraventricular tachycardia: Secondary | ICD-10-CM | POA: Diagnosis not present

## 2023-09-01 DIAGNOSIS — I10 Essential (primary) hypertension: Secondary | ICD-10-CM

## 2023-09-01 NOTE — Progress Notes (Unsigned)
[id number] from office inventory applied to patient.

## 2023-09-03 DIAGNOSIS — M2560 Stiffness of unspecified joint, not elsewhere classified: Secondary | ICD-10-CM | POA: Diagnosis not present

## 2023-09-03 DIAGNOSIS — R296 Repeated falls: Secondary | ICD-10-CM | POA: Diagnosis not present

## 2023-09-03 DIAGNOSIS — R2689 Other abnormalities of gait and mobility: Secondary | ICD-10-CM | POA: Diagnosis not present

## 2023-09-07 ENCOUNTER — Telehealth: Payer: Self-pay | Admitting: *Deleted

## 2023-09-07 NOTE — Telephone Encounter (Signed)
Original enrollment for cardiac event monitor was placed as a home enrollment.  [employer] was contacted 09/02/23 at 1:39 PM to inform them the monitor serial # [id number] was applied to patient in clinic from our office inventory.   The message did not get through to shipping department fast enough for them to stop shipment of monitor as a home enrollment.   Patient instructed to send extra monitor back to [facility] when she ships back the monitor she is wearing.  Both monitor kits come in a box with a prepaid return [employer] shipping label on the box.

## 2023-09-07 NOTE — Telephone Encounter (Signed)
-----   Message from Twining G sent at 09/04/2023  9:55 AM EDT ----- Regarding: Monitor Patient called and said she received another monitor today through [employer]. Pt states she is already wearing one so she doesn't know why she received a second one. Pt would like a call back. Thanks

## 2023-09-14 DIAGNOSIS — M2560 Stiffness of unspecified joint, not elsewhere classified: Secondary | ICD-10-CM | POA: Diagnosis not present

## 2023-09-14 DIAGNOSIS — R2689 Other abnormalities of gait and mobility: Secondary | ICD-10-CM | POA: Diagnosis not present

## 2023-09-14 DIAGNOSIS — R296 Repeated falls: Secondary | ICD-10-CM | POA: Diagnosis not present

## 2023-09-17 DIAGNOSIS — R296 Repeated falls: Secondary | ICD-10-CM | POA: Diagnosis not present

## 2023-09-17 DIAGNOSIS — R2689 Other abnormalities of gait and mobility: Secondary | ICD-10-CM | POA: Diagnosis not present

## 2023-09-17 DIAGNOSIS — M2560 Stiffness of unspecified joint, not elsewhere classified: Secondary | ICD-10-CM | POA: Diagnosis not present

## 2023-09-21 DIAGNOSIS — R296 Repeated falls: Secondary | ICD-10-CM | POA: Diagnosis not present

## 2023-09-21 DIAGNOSIS — M2560 Stiffness of unspecified joint, not elsewhere classified: Secondary | ICD-10-CM | POA: Diagnosis not present

## 2023-09-21 DIAGNOSIS — R2689 Other abnormalities of gait and mobility: Secondary | ICD-10-CM | POA: Diagnosis not present

## 2023-09-22 DIAGNOSIS — M47816 Spondylosis without myelopathy or radiculopathy, lumbar region: Secondary | ICD-10-CM | POA: Diagnosis not present

## 2023-09-25 DIAGNOSIS — M2560 Stiffness of unspecified joint, not elsewhere classified: Secondary | ICD-10-CM | POA: Diagnosis not present

## 2023-09-25 DIAGNOSIS — R296 Repeated falls: Secondary | ICD-10-CM | POA: Diagnosis not present

## 2023-09-25 DIAGNOSIS — R2689 Other abnormalities of gait and mobility: Secondary | ICD-10-CM | POA: Diagnosis not present

## 2023-09-28 DIAGNOSIS — M2560 Stiffness of unspecified joint, not elsewhere classified: Secondary | ICD-10-CM | POA: Diagnosis not present

## 2023-09-28 DIAGNOSIS — R2689 Other abnormalities of gait and mobility: Secondary | ICD-10-CM | POA: Diagnosis not present

## 2023-09-28 DIAGNOSIS — R296 Repeated falls: Secondary | ICD-10-CM | POA: Diagnosis not present

## 2023-09-29 ENCOUNTER — Telehealth: Payer: Self-pay | Admitting: Cardiology

## 2023-09-29 NOTE — Telephone Encounter (Signed)
Patwardhan, Anabel Bene, MD  Loa Socks, LPN Cc: [facility] [address] Triage Caller: Unspecified (Today,  3:53 PM) Noted.

## 2023-09-29 NOTE — Telephone Encounter (Signed)
Patient states her event monitor keeps vibrating and saying "no skin contact" or "poor skin contact." She states she has been wearing it for about a month now and would like to know if it can be removed. Please advise.

## 2023-09-29 NOTE — Telephone Encounter (Signed)
Patients last day of service was 09/30/23.  Patient instructed to remove monitor and ship back to [employer] in box with prepaid return UPS label on it.

## 2023-10-02 DIAGNOSIS — M2560 Stiffness of unspecified joint, not elsewhere classified: Secondary | ICD-10-CM | POA: Diagnosis not present

## 2023-10-02 DIAGNOSIS — R296 Repeated falls: Secondary | ICD-10-CM | POA: Diagnosis not present

## 2023-10-02 DIAGNOSIS — R2689 Other abnormalities of gait and mobility: Secondary | ICD-10-CM | POA: Diagnosis not present

## 2023-10-05 DIAGNOSIS — R296 Repeated falls: Secondary | ICD-10-CM | POA: Diagnosis not present

## 2023-10-05 DIAGNOSIS — R2689 Other abnormalities of gait and mobility: Secondary | ICD-10-CM | POA: Diagnosis not present

## 2023-10-05 DIAGNOSIS — M2560 Stiffness of unspecified joint, not elsewhere classified: Secondary | ICD-10-CM | POA: Diagnosis not present

## 2023-10-09 DIAGNOSIS — R2689 Other abnormalities of gait and mobility: Secondary | ICD-10-CM | POA: Diagnosis not present

## 2023-10-09 DIAGNOSIS — R296 Repeated falls: Secondary | ICD-10-CM | POA: Diagnosis not present

## 2023-10-09 DIAGNOSIS — M2560 Stiffness of unspecified joint, not elsewhere classified: Secondary | ICD-10-CM | POA: Diagnosis not present

## 2023-10-12 DIAGNOSIS — M2560 Stiffness of unspecified joint, not elsewhere classified: Secondary | ICD-10-CM | POA: Diagnosis not present

## 2023-10-12 DIAGNOSIS — R2689 Other abnormalities of gait and mobility: Secondary | ICD-10-CM | POA: Diagnosis not present

## 2023-10-12 DIAGNOSIS — R296 Repeated falls: Secondary | ICD-10-CM | POA: Diagnosis not present

## 2023-10-15 DIAGNOSIS — M2560 Stiffness of unspecified joint, not elsewhere classified: Secondary | ICD-10-CM | POA: Diagnosis not present

## 2023-10-15 DIAGNOSIS — R296 Repeated falls: Secondary | ICD-10-CM | POA: Diagnosis not present

## 2023-10-15 DIAGNOSIS — R2689 Other abnormalities of gait and mobility: Secondary | ICD-10-CM | POA: Diagnosis not present

## 2023-10-19 DIAGNOSIS — R2689 Other abnormalities of gait and mobility: Secondary | ICD-10-CM | POA: Diagnosis not present

## 2023-10-19 DIAGNOSIS — M2560 Stiffness of unspecified joint, not elsewhere classified: Secondary | ICD-10-CM | POA: Diagnosis not present

## 2023-10-19 DIAGNOSIS — R296 Repeated falls: Secondary | ICD-10-CM | POA: Diagnosis not present

## 2023-10-23 DIAGNOSIS — R296 Repeated falls: Secondary | ICD-10-CM | POA: Diagnosis not present

## 2023-10-23 DIAGNOSIS — R2689 Other abnormalities of gait and mobility: Secondary | ICD-10-CM | POA: Diagnosis not present

## 2023-10-23 DIAGNOSIS — M2560 Stiffness of unspecified joint, not elsewhere classified: Secondary | ICD-10-CM | POA: Diagnosis not present

## 2023-10-23 NOTE — Progress Notes (Signed)
Event monitor reveals very brief atrial tachycardia (rapid heartbeat) for a maximum of 7 seconds.  Otherwise frequent PACs are noted.  No atrial fibrillation.  PAC = Premature atrial complexes: These arise from the upper chamber of the heart.  These are very common and are not dangerous.  Extra skipped beat coming from the upper chamber (atrium) and mostly are life altering (nuisance) than life threatening and mostly treated by reassurance.  There may not be any specific reasons for this, however patients with excessive caffeine, anxiety, lack of sleep, alcohol or thyroid problems can have these episodes.

## 2023-10-26 DIAGNOSIS — M2560 Stiffness of unspecified joint, not elsewhere classified: Secondary | ICD-10-CM | POA: Diagnosis not present

## 2023-10-26 DIAGNOSIS — R296 Repeated falls: Secondary | ICD-10-CM | POA: Diagnosis not present

## 2023-10-26 DIAGNOSIS — R2689 Other abnormalities of gait and mobility: Secondary | ICD-10-CM | POA: Diagnosis not present

## 2023-10-30 DIAGNOSIS — R2689 Other abnormalities of gait and mobility: Secondary | ICD-10-CM | POA: Diagnosis not present

## 2023-10-30 DIAGNOSIS — M2560 Stiffness of unspecified joint, not elsewhere classified: Secondary | ICD-10-CM | POA: Diagnosis not present

## 2023-10-30 DIAGNOSIS — R296 Repeated falls: Secondary | ICD-10-CM | POA: Diagnosis not present

## 2023-11-03 DIAGNOSIS — M2560 Stiffness of unspecified joint, not elsewhere classified: Secondary | ICD-10-CM | POA: Diagnosis not present

## 2023-11-03 DIAGNOSIS — R296 Repeated falls: Secondary | ICD-10-CM | POA: Diagnosis not present

## 2023-11-03 DIAGNOSIS — R2689 Other abnormalities of gait and mobility: Secondary | ICD-10-CM | POA: Diagnosis not present

## 2023-11-13 DIAGNOSIS — R296 Repeated falls: Secondary | ICD-10-CM | POA: Diagnosis not present

## 2023-11-13 DIAGNOSIS — M2560 Stiffness of unspecified joint, not elsewhere classified: Secondary | ICD-10-CM | POA: Diagnosis not present

## 2023-11-13 DIAGNOSIS — R2689 Other abnormalities of gait and mobility: Secondary | ICD-10-CM | POA: Diagnosis not present

## 2023-11-16 DIAGNOSIS — R296 Repeated falls: Secondary | ICD-10-CM | POA: Diagnosis not present

## 2023-11-16 DIAGNOSIS — R2689 Other abnormalities of gait and mobility: Secondary | ICD-10-CM | POA: Diagnosis not present

## 2023-11-16 DIAGNOSIS — M2560 Stiffness of unspecified joint, not elsewhere classified: Secondary | ICD-10-CM | POA: Diagnosis not present

## 2023-11-17 DIAGNOSIS — M47816 Spondylosis without myelopathy or radiculopathy, lumbar region: Secondary | ICD-10-CM | POA: Diagnosis not present

## 2023-11-18 DIAGNOSIS — R296 Repeated falls: Secondary | ICD-10-CM | POA: Diagnosis not present

## 2023-11-18 DIAGNOSIS — M2560 Stiffness of unspecified joint, not elsewhere classified: Secondary | ICD-10-CM | POA: Diagnosis not present

## 2023-11-18 DIAGNOSIS — R2689 Other abnormalities of gait and mobility: Secondary | ICD-10-CM | POA: Diagnosis not present

## 2023-11-25 DIAGNOSIS — R296 Repeated falls: Secondary | ICD-10-CM | POA: Diagnosis not present

## 2023-11-25 DIAGNOSIS — R2689 Other abnormalities of gait and mobility: Secondary | ICD-10-CM | POA: Diagnosis not present

## 2023-11-25 DIAGNOSIS — M2560 Stiffness of unspecified joint, not elsewhere classified: Secondary | ICD-10-CM | POA: Diagnosis not present

## 2023-11-27 DIAGNOSIS — R2689 Other abnormalities of gait and mobility: Secondary | ICD-10-CM | POA: Diagnosis not present

## 2023-11-27 DIAGNOSIS — R296 Repeated falls: Secondary | ICD-10-CM | POA: Diagnosis not present

## 2023-11-27 DIAGNOSIS — M2560 Stiffness of unspecified joint, not elsewhere classified: Secondary | ICD-10-CM | POA: Diagnosis not present

## 2023-12-02 DIAGNOSIS — R2689 Other abnormalities of gait and mobility: Secondary | ICD-10-CM | POA: Diagnosis not present

## 2023-12-02 DIAGNOSIS — M2560 Stiffness of unspecified joint, not elsewhere classified: Secondary | ICD-10-CM | POA: Diagnosis not present

## 2023-12-02 DIAGNOSIS — R296 Repeated falls: Secondary | ICD-10-CM | POA: Diagnosis not present

## 2023-12-07 DIAGNOSIS — R2689 Other abnormalities of gait and mobility: Secondary | ICD-10-CM | POA: Diagnosis not present

## 2023-12-07 DIAGNOSIS — R296 Repeated falls: Secondary | ICD-10-CM | POA: Diagnosis not present

## 2023-12-07 DIAGNOSIS — M2560 Stiffness of unspecified joint, not elsewhere classified: Secondary | ICD-10-CM | POA: Diagnosis not present

## 2023-12-09 DIAGNOSIS — R296 Repeated falls: Secondary | ICD-10-CM | POA: Diagnosis not present

## 2023-12-09 DIAGNOSIS — M2560 Stiffness of unspecified joint, not elsewhere classified: Secondary | ICD-10-CM | POA: Diagnosis not present

## 2023-12-09 DIAGNOSIS — R2689 Other abnormalities of gait and mobility: Secondary | ICD-10-CM | POA: Diagnosis not present

## 2023-12-14 DIAGNOSIS — R296 Repeated falls: Secondary | ICD-10-CM | POA: Diagnosis not present

## 2023-12-14 DIAGNOSIS — M2560 Stiffness of unspecified joint, not elsewhere classified: Secondary | ICD-10-CM | POA: Diagnosis not present

## 2023-12-14 DIAGNOSIS — R2689 Other abnormalities of gait and mobility: Secondary | ICD-10-CM | POA: Diagnosis not present

## 2023-12-18 DIAGNOSIS — M2560 Stiffness of unspecified joint, not elsewhere classified: Secondary | ICD-10-CM | POA: Diagnosis not present

## 2023-12-18 DIAGNOSIS — R296 Repeated falls: Secondary | ICD-10-CM | POA: Diagnosis not present

## 2023-12-18 DIAGNOSIS — R2689 Other abnormalities of gait and mobility: Secondary | ICD-10-CM | POA: Diagnosis not present

## 2023-12-21 DIAGNOSIS — M8588 Other specified disorders of bone density and structure, other site: Secondary | ICD-10-CM | POA: Diagnosis not present

## 2023-12-22 DIAGNOSIS — M2560 Stiffness of unspecified joint, not elsewhere classified: Secondary | ICD-10-CM | POA: Diagnosis not present

## 2023-12-22 DIAGNOSIS — R2689 Other abnormalities of gait and mobility: Secondary | ICD-10-CM | POA: Diagnosis not present

## 2023-12-22 DIAGNOSIS — R296 Repeated falls: Secondary | ICD-10-CM | POA: Diagnosis not present

## 2023-12-25 DIAGNOSIS — R2689 Other abnormalities of gait and mobility: Secondary | ICD-10-CM | POA: Diagnosis not present

## 2023-12-25 DIAGNOSIS — R296 Repeated falls: Secondary | ICD-10-CM | POA: Diagnosis not present

## 2023-12-25 DIAGNOSIS — M2560 Stiffness of unspecified joint, not elsewhere classified: Secondary | ICD-10-CM | POA: Diagnosis not present

## 2023-12-28 ENCOUNTER — Other Ambulatory Visit: Payer: Self-pay | Admitting: Internal Medicine

## 2023-12-28 DIAGNOSIS — M2560 Stiffness of unspecified joint, not elsewhere classified: Secondary | ICD-10-CM | POA: Diagnosis not present

## 2023-12-28 DIAGNOSIS — R296 Repeated falls: Secondary | ICD-10-CM | POA: Diagnosis not present

## 2023-12-28 DIAGNOSIS — R2689 Other abnormalities of gait and mobility: Secondary | ICD-10-CM | POA: Diagnosis not present

## 2023-12-30 DIAGNOSIS — R2689 Other abnormalities of gait and mobility: Secondary | ICD-10-CM | POA: Diagnosis not present

## 2023-12-30 DIAGNOSIS — M2560 Stiffness of unspecified joint, not elsewhere classified: Secondary | ICD-10-CM | POA: Diagnosis not present

## 2023-12-30 DIAGNOSIS — R296 Repeated falls: Secondary | ICD-10-CM | POA: Diagnosis not present

## 2024-01-04 DIAGNOSIS — R296 Repeated falls: Secondary | ICD-10-CM | POA: Diagnosis not present

## 2024-01-04 DIAGNOSIS — R2689 Other abnormalities of gait and mobility: Secondary | ICD-10-CM | POA: Diagnosis not present

## 2024-01-04 DIAGNOSIS — M2560 Stiffness of unspecified joint, not elsewhere classified: Secondary | ICD-10-CM | POA: Diagnosis not present

## 2024-01-05 ENCOUNTER — Telehealth: Payer: Self-pay | Admitting: Cardiology

## 2024-01-05 MED ORDER — METOPROLOL SUCCINATE ER 25 MG PO TB24: 25.0000 mg | ORAL_TABLET | Freq: Every day | ORAL | 2 refills | Status: DC

## 2024-01-05 NOTE — Telephone Encounter (Signed)
*  STAT* If patient is at the pharmacy, call can be transferred to refill team.   1. Which medications need to be refilled? (please list name of each medication and dose if known) new prescription for Metoprolol Succinate   2. Would you like to learn more about the convenience, safety, & potential cost savings by using the [facility]?    3. Are you open to using the [facility] (Type [facility]. .   4. Which pharmacy/location (including street and city if local pharmacy) is medication to be sent to? [facility] [address] [facility]   5. Do they need a 30 day or 90 day supply? 90 days and refills

## 2024-01-06 DIAGNOSIS — R2689 Other abnormalities of gait and mobility: Secondary | ICD-10-CM | POA: Diagnosis not present

## 2024-01-06 DIAGNOSIS — M2560 Stiffness of unspecified joint, not elsewhere classified: Secondary | ICD-10-CM | POA: Diagnosis not present

## 2024-01-06 DIAGNOSIS — R296 Repeated falls: Secondary | ICD-10-CM | POA: Diagnosis not present

## 2024-01-26 DIAGNOSIS — Z79899 Other long term (current) drug therapy: Secondary | ICD-10-CM | POA: Diagnosis not present

## 2024-02-12 ENCOUNTER — Ambulatory Visit: Payer: Medicare Other | Attending: Cardiology | Admitting: Cardiology

## 2024-02-12 ENCOUNTER — Encounter: Payer: Self-pay | Admitting: Cardiology

## 2024-02-12 VITALS — BP 146/79 | HR 69 | Resp 16 | Ht <= 58 in | Wt 102.0 lb

## 2024-02-12 DIAGNOSIS — E782 Mixed hyperlipidemia: Secondary | ICD-10-CM | POA: Diagnosis not present

## 2024-02-12 DIAGNOSIS — I4719 Other supraventricular tachycardia: Secondary | ICD-10-CM | POA: Diagnosis present

## 2024-02-12 MED ORDER — EZETIMIBE 10 MG PO TABS: 10.0000 mg | ORAL_TABLET | Freq: Every day | ORAL | 3 refills | Status: AC

## 2024-02-12 NOTE — Progress Notes (Signed)
 Cardiology Office Note:  .   Date:  02/12/2024  ID:  Valerie Hull, DOB [birth year], MRN [id number] PCP: Valerie Hazel, MD  [facility] Providers Cardiologist:  Valerie Mainland, MD PCP: Valerie Hazel, MD  Chief Complaint  Patient presents with   Hypertension   Follow-up            History of Present Illness: Marland Kitchen    Valerie Hull is a 83 y.o. female with paroxysmal atrial tachycardia/PSVT, hyperlipidemia  No complaints today.  Reviewed monitor results with the patient, details below.  She has not had a recent lipid panel, but LDL was >180 in 2022.  She has not tolerated Crestor and Lipitor due to myalgias.  Vitals:   02/12/24 1103  BP: (!) 146/79  Pulse: 69  Resp: 16  SpO2: 98%      ROS:  Review of Systems  Cardiovascular:  Negative for chest pain, dyspnea on exertion, leg swelling, palpitations and syncope.  Musculoskeletal:  Positive for falls.     Studies Reviewed: Marland Kitchen       EKG 08/24/2023: Normal sinus rhythm Normal ECG No previous ECGs available    Lipid panel 10/2021: Chol 265, TG 99, HDL 61, LDL 187  Extended outpatient EKG monitoring for 30 days starting 09/01/2023: Minimum heart rate 52 bpm at 6 AM, maximum heart rate 145 bpm at 6 PM, average heartbeat 79. The predominant rhythm was normal sinus rhythm. There was 1 SVT episode for 7 seconds, EKG = atrial tachycardia. PAC burden 1%. There are frequent PVCs, total PVCs 178,228.  PVC burden 6%. There was no atrial fibrillation, there was no heart block.  There was 1 patient triggered event at baseline, normal sinus rhythm. No other symptomatic events were noted.        Risk Assessment/Calculations:    CHA2DS2-VASc Score = 4  This indicates a 4.8% annual risk of stroke. The patient's score is based upon: CHF History: 0 HTN History: 1 Diabetes History: 0 Stroke History: 0 Vascular Disease History: 0 Age Score: 2 Gender Score: 1     Physical Exam:   Physical Exam Vitals and  nursing note reviewed.  Constitutional:      General: She is not in acute distress. Neck:     Vascular: No JVD.  Cardiovascular:     Rate and Rhythm: Normal rate and regular rhythm.     Heart sounds: Normal heart sounds. No murmur heard. Pulmonary:     Effort: Pulmonary effort is normal.     Breath sounds: Normal breath sounds. No wheezing or rales.  Musculoskeletal:     Right lower leg: No edema.     Left lower leg: No edema.      VISIT DIAGNOSES:   ICD-10-CM   1. Mixed hyperlipidemia  E78.2 Lipid panel    Lipid panel    CANCELED: Lipid panel    2. Atrial tachycardia (HCC)  I47.19         ASSESSMENT AND PLAN: .    Valerie Hull is a 83 y.o. female with paroxysmal atrial tachycardia/PSVT, hyperlipidemia, statin intolerance.  Atrial arrhythmia: No A-fib on monitor.  No symptoms related to SVT/atrial tachycardia at this time.  Continue clinical follow-up and monitoring.  Hypertension: Well-controlled  Mixed hyperlipidemia: LDL 187 previously.  She has a follow-up with Dr. Hyacinth Meeker soon where lipid panel can be checked with other annual labs. Given her intolerance to Crestor and Lipitor due to myalgias, I have started on Zetia 10 mg daily. Repeat lipid  panel in 04/2024.  If LDL remains elevated, she may need alternate nonstatin agents including bempedoic acid or injectable agents.  F/u in 6 months  Signed, Valerie Negus, MD

## 2024-02-12 NOTE — Patient Instructions (Signed)
 Medication Instructions:  START Zetia 10 mg take one tablet by mouth daily   *If you need a refill on your cardiac medications before your next appointment, please call your pharmacy*  Lab Work: Lipid panel in 04/2024  If you have labs (blood work) drawn today and your tests are completely normal, you will receive your results only by: MyChart Message (if you have MyChart) OR A paper copy in the mail If you have any lab test that is abnormal or we need to change your treatment, we will call you to review the results.   Follow-Up: At [facility], you and your health needs are our priority.  As part of our continuing mission to provide you with exceptional heart care, our providers are all part of one team.  This team includes your primary Cardiologist (physician) and Advanced Practice Providers or APPs (Physician Assistants and Nurse Practitioners) who all work together to provide you with the care you need, when you need it.  Your next appointment:   1 year(s)  Provider:   Elder Negus, MD     We recommend signing up for the patient portal called "MyChart".  Sign up information is provided on this After Visit Summary.  MyChart is used to connect with patients for Virtual Visits (Telemedicine).  Patients are able to view lab/test results, encounter notes, upcoming appointments, etc.  Non-urgent messages can be sent to your provider as well.   To learn more about what you can do with MyChart, go to [email or web address].   Other Instructions       1st Floor: - Lobby - Registration  - Pharmacy  - Lab - Cafe  2nd Floor: - PV Lab - Diagnostic Testing (echo, CT, nuclear med)  3rd Floor: - Vacant  4th Floor: - TCTS (cardiothoracic surgery) - AFib Clinic - Structural Heart Clinic - Vascular Surgery  - Vascular Ultrasound  5th Floor: - HeartCare Cardiology (general and EP) - Clinical Pharmacy for coumadin, hypertension, lipid, weight-loss medications,  and med management appointments    Valet parking services will be available as well.

## 2024-03-02 DIAGNOSIS — Z0000 Encounter for general adult medical examination without abnormal findings: Secondary | ICD-10-CM | POA: Diagnosis not present

## 2024-03-02 DIAGNOSIS — Z1331 Encounter for screening for depression: Secondary | ICD-10-CM | POA: Diagnosis not present

## 2024-03-02 DIAGNOSIS — M5416 Radiculopathy, lumbar region: Secondary | ICD-10-CM | POA: Diagnosis not present

## 2024-03-02 DIAGNOSIS — Z6821 Body mass index (BMI) 21.0-21.9, adult: Secondary | ICD-10-CM | POA: Diagnosis not present

## 2024-03-02 DIAGNOSIS — I471 Supraventricular tachycardia, unspecified: Secondary | ICD-10-CM | POA: Diagnosis not present

## 2024-03-02 DIAGNOSIS — I10 Essential (primary) hypertension: Secondary | ICD-10-CM | POA: Diagnosis not present

## 2024-03-02 DIAGNOSIS — E7800 Pure hypercholesterolemia, unspecified: Secondary | ICD-10-CM | POA: Diagnosis not present

## 2024-03-02 DIAGNOSIS — Z23 Encounter for immunization: Secondary | ICD-10-CM | POA: Diagnosis not present

## 2024-03-02 DIAGNOSIS — M810 Age-related osteoporosis without current pathological fracture: Secondary | ICD-10-CM | POA: Diagnosis not present

## 2024-03-15 DIAGNOSIS — M47816 Spondylosis without myelopathy or radiculopathy, lumbar region: Secondary | ICD-10-CM | POA: Diagnosis not present

## 2024-03-30 DIAGNOSIS — M810 Age-related osteoporosis without current pathological fracture: Secondary | ICD-10-CM | POA: Diagnosis not present

## 2024-03-31 DIAGNOSIS — M47816 Spondylosis without myelopathy or radiculopathy, lumbar region: Secondary | ICD-10-CM | POA: Diagnosis not present

## 2024-04-15 DIAGNOSIS — H52203 Unspecified astigmatism, bilateral: Secondary | ICD-10-CM | POA: Diagnosis not present

## 2024-04-15 DIAGNOSIS — H33303 Unspecified retinal break, bilateral: Secondary | ICD-10-CM | POA: Diagnosis not present

## 2024-04-15 DIAGNOSIS — Z961 Presence of intraocular lens: Secondary | ICD-10-CM | POA: Diagnosis not present

## 2024-04-15 DIAGNOSIS — H5000 Unspecified esotropia: Secondary | ICD-10-CM | POA: Diagnosis not present

## 2024-05-09 DIAGNOSIS — S7291XD Unspecified fracture of right femur, subsequent encounter for closed fracture with routine healing: Secondary | ICD-10-CM | POA: Diagnosis not present

## 2024-07-15 DIAGNOSIS — M4120 Other idiopathic scoliosis, site unspecified: Secondary | ICD-10-CM | POA: Diagnosis not present

## 2024-07-15 DIAGNOSIS — M47816 Spondylosis without myelopathy or radiculopathy, lumbar region: Secondary | ICD-10-CM | POA: Diagnosis not present

## 2024-07-15 DIAGNOSIS — M546 Pain in thoracic spine: Secondary | ICD-10-CM | POA: Diagnosis not present

## 2024-07-28 ENCOUNTER — Other Ambulatory Visit: Payer: Self-pay | Admitting: Neurosurgery

## 2024-07-28 DIAGNOSIS — M546 Pain in thoracic spine: Secondary | ICD-10-CM

## 2024-08-08 DIAGNOSIS — Z1231 Encounter for screening mammogram for malignant neoplasm of breast: Secondary | ICD-10-CM | POA: Diagnosis not present

## 2024-08-16 DIAGNOSIS — S7291XD Unspecified fracture of right femur, subsequent encounter for closed fracture with routine healing: Secondary | ICD-10-CM | POA: Diagnosis not present

## 2024-08-19 ENCOUNTER — Ambulatory Visit
Admission: RE | Admit: 2024-08-19 | Discharge: 2024-08-19 | Disposition: A | Discharge status: Home / Self Care | Source: Ambulatory Visit | Attending: Neurosurgery | Admitting: Neurosurgery

## 2024-08-19 DIAGNOSIS — S22050A Wedge compression fracture of T5-T6 vertebra, initial encounter for closed fracture: Secondary | ICD-10-CM | POA: Diagnosis not present

## 2024-08-19 DIAGNOSIS — M546 Pain in thoracic spine: Secondary | ICD-10-CM

## 2024-08-23 DIAGNOSIS — S22050A Wedge compression fracture of T5-T6 vertebra, initial encounter for closed fracture: Secondary | ICD-10-CM | POA: Diagnosis not present

## 2024-09-05 DIAGNOSIS — Z23 Encounter for immunization: Secondary | ICD-10-CM | POA: Diagnosis not present

## 2024-09-13 DIAGNOSIS — H60543 Acute eczematoid otitis externa, bilateral: Secondary | ICD-10-CM | POA: Diagnosis not present

## 2024-09-13 DIAGNOSIS — H903 Sensorineural hearing loss, bilateral: Secondary | ICD-10-CM | POA: Diagnosis not present

## 2024-09-19 ENCOUNTER — Other Ambulatory Visit: Payer: Self-pay | Admitting: Cardiology

## 2024-10-18 DIAGNOSIS — M4726 Other spondylosis with radiculopathy, lumbar region: Secondary | ICD-10-CM | POA: Diagnosis not present
